# Patient Record
Sex: Female | Born: 1965 | Race: White | Hispanic: No | Marital: Married | Smoking: Never smoker
Health system: Southern US, Community
[De-identification: ages and names within clinical notes are randomized; demographics above are authoritative.]

## PROBLEM LIST (undated history)

## (undated) DIAGNOSIS — I10 Essential (primary) hypertension: Secondary | ICD-10-CM

## (undated) DIAGNOSIS — E119 Type 2 diabetes mellitus without complications: Secondary | ICD-10-CM

---

## 1998-09-13 ENCOUNTER — Encounter: Admission: RE | Admit: 1998-09-13 | Discharge: 1998-09-13 | Payer: Self-pay | Admitting: Sports Medicine

## 1998-10-10 ENCOUNTER — Encounter: Admission: RE | Admit: 1998-10-10 | Discharge: 1998-10-10 | Payer: Self-pay | Admitting: Family Medicine

## 2001-01-09 ENCOUNTER — Emergency Department (HOSPITAL_COMMUNITY): Admission: EM | Admit: 2001-01-09 | Discharge: 2001-01-09 | Payer: Self-pay | Admitting: Internal Medicine

## 2006-04-12 ENCOUNTER — Emergency Department (HOSPITAL_COMMUNITY): Admission: EM | Admit: 2006-04-12 | Discharge: 2006-04-13 | Payer: Self-pay | Admitting: Emergency Medicine

## 2006-12-29 ENCOUNTER — Emergency Department (HOSPITAL_COMMUNITY): Admission: EM | Admit: 2006-12-29 | Discharge: 2006-12-29 | Payer: Self-pay | Admitting: Emergency Medicine

## 2007-09-16 ENCOUNTER — Emergency Department (HOSPITAL_COMMUNITY): Admission: EM | Admit: 2007-09-16 | Discharge: 2007-09-16 | Payer: Self-pay | Admitting: Family Medicine
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
## 2009-07-10 ENCOUNTER — Emergency Department (HOSPITAL_COMMUNITY): Admission: EM | Admit: 2009-07-10 | Discharge: 2009-07-10 | Payer: Self-pay | Admitting: Family Medicine

## 2011-05-11 ENCOUNTER — Encounter: Payer: Self-pay | Admitting: Emergency Medicine

## 2011-05-11 ENCOUNTER — Emergency Department (HOSPITAL_COMMUNITY)
Admission: EM | Admit: 2011-05-11 | Discharge: 2011-05-11 | Disposition: A | Discharge status: Home / Self Care | Payer: Self-pay | Source: Home / Self Care | Attending: Family Medicine | Admitting: Family Medicine

## 2011-05-11 DIAGNOSIS — J329 Chronic sinusitis, unspecified: Secondary | ICD-10-CM

## 2011-05-11 MED ORDER — GUAIFENESIN-CODEINE 100-10 MG/5ML PO SYRP: 5.0000 mL | ORAL_SOLUTION | Freq: Four times a day (QID) | ORAL | Status: AC | PRN

## 2011-05-11 MED ORDER — AMOXICILLIN 500 MG PO CAPS: 500.0000 mg | ORAL_CAPSULE | Freq: Three times a day (TID) | ORAL | Status: AC

## 2011-05-11 NOTE — ED Provider Notes (Signed)
History     CSN: [id number] Arrival date & time: 05/11/2011  9:05 AM   First MD Initiated Contact with Patient 05/11/11 [phone]      Chief Complaint  Patient presents with  . Headache  . Emesis  . Sinusitis    (Consider location/radiation/quality/duration/timing/severity/associated sxs/prior treatment) Patient is a 45 y.o. female presenting with sinusitis.  Sinusitis  This is a new problem. The current episode started more than 2 days ago. The problem has been gradually worsening. There has been no fever. Associated symptoms include chills, congestion, sinus pressure, sore throat and cough. Pertinent negatives include no ear pain. Associated symptoms comments: headache.    History reviewed. No pertinent past medical history.  History reviewed. No pertinent past surgical history.  Family History  Problem Relation Age of Onset  . Hypertension Mother     History  Substance Use Topics  . Smoking status: Never Smoker   . Smokeless tobacco: Not on file  . Alcohol Use: No    OB History    Grav Para Term Preterm Abortions TAB SAB Ect Mult Living                  Review of Systems  Constitutional: Positive for chills. Negative for fever.  HENT: Positive for congestion, sore throat, rhinorrhea, postnasal drip and sinus pressure. Negative for ear pain.   Respiratory: Positive for cough. Negative for wheezing.   Cardiovascular: Negative.   Gastrointestinal: Negative.   Genitourinary: Negative.   Neurological: Positive for headaches. Negative for dizziness.    Allergies  Review of patient's allergies indicates no known allergies.  Home Medications   Current Outpatient Rx  Name Route Sig Dispense Refill  . AMOXICILLIN 500 MG PO CAPS Oral Take 1 capsule (500 mg total) by mouth 3 (three) times daily. 30 capsule 0  . GUAIFENESIN-CODEINE 100-10 MG/5ML PO SYRP Oral Take 5 mLs by mouth every 6 (six) hours as needed for cough. 120 mL 0    BP 135/75  Pulse 66  Temp(Src) 98.2  F (36.8 C) (Oral)  Resp 16  SpO2 99%  LMP 04/22/2011  Physical Exam  Nursing note and vitals reviewed. Constitutional: She appears well-developed and well-nourished.  HENT:  Head: Normocephalic.  Right Ear: External ear normal.  Left Ear: External ear normal.       Nose congested , post nasal drainage noted. Throat mild erythema.  Neck: Normal range of motion. Neck supple.  Cardiovascular: Normal rate and regular rhythm.   Pulmonary/Chest: Effort normal and breath sounds normal.       Dry cough noted  Skin: Skin is warm and dry.    ED Course  Procedures (including critical care time)  Labs Reviewed - No data to display No results found.   1. Sinusitis       MDM          Randa Spike, MD 05/11/11 1006

## 2011-05-11 NOTE — ED Notes (Signed)
Pt comes in with sinus infection that started Tuesday but has worsening h/a,pain behind eyes and now x 2 episodes of vomiting and weakness.pt also reports cough with ocass white phlegm.denies fever,chills.pt has been taking otc dayquil/nyquil but not working.

## 2017-02-01 ENCOUNTER — Encounter (HOSPITAL_COMMUNITY): Payer: Self-pay | Admitting: Nurse Practitioner

## 2017-02-01 ENCOUNTER — Emergency Department (HOSPITAL_COMMUNITY): Payer: Self-pay

## 2017-02-01 ENCOUNTER — Emergency Department (HOSPITAL_COMMUNITY)
Admission: EM | Admit: 2017-02-01 | Discharge: 2017-02-02 | Disposition: A | Discharge status: Home / Self Care | Payer: Self-pay | Attending: Emergency Medicine | Admitting: Emergency Medicine

## 2017-02-01 DIAGNOSIS — I10 Essential (primary) hypertension: Secondary | ICD-10-CM | POA: Insufficient documentation

## 2017-02-01 DIAGNOSIS — M79605 Pain in left leg: Secondary | ICD-10-CM | POA: Insufficient documentation

## 2017-02-01 DIAGNOSIS — R2242 Localized swelling, mass and lump, left lower limb: Secondary | ICD-10-CM | POA: Insufficient documentation

## 2017-02-01 DIAGNOSIS — M7989 Other specified soft tissue disorders: Secondary | ICD-10-CM

## 2017-02-01 HISTORY — DX: Essential (primary) hypertension: I10

## 2017-02-01 LAB — CBC WITH DIFFERENTIAL/PLATELET
BASOS PCT: 0 %
Basophils Absolute: 0 10*3/uL (ref 0.0–0.1)
EOS ABS: 0.1 10*3/uL (ref 0.0–0.7)
Eosinophils Relative: 2 %
HCT: 34.6 % — ABNORMAL LOW (ref 36.0–46.0)
HEMOGLOBIN: 12 g/dL (ref 12.0–15.0)
Lymphocytes Relative: 34 %
Lymphs Abs: 1 10*3/uL (ref 0.7–4.0)
MCH: 35.4 pg — ABNORMAL HIGH (ref 26.0–34.0)
MCHC: 34.7 g/dL (ref 30.0–36.0)
MCV: 102.1 fL — ABNORMAL HIGH (ref 78.0–100.0)
MONOS PCT: 14 %
Monocytes Absolute: 0.4 10*3/uL (ref 0.1–1.0)
NEUTROS PCT: 49 %
Neutro Abs: 1.5 10*3/uL — ABNORMAL LOW (ref 1.7–7.7)
PLATELETS: DECREASED 10*3/uL (ref 150–400)
RBC: 3.39 MIL/uL — ABNORMAL LOW (ref 3.87–5.11)
RDW: 14.5 % (ref 11.5–15.5)
WBC: 3 10*3/uL — ABNORMAL LOW (ref 4.0–10.5)

## 2017-02-01 LAB — BASIC METABOLIC PANEL
Anion gap: 5 (ref 5–15)
BUN: 7 mg/dL (ref 6–20)
CALCIUM: 8.7 mg/dL — AB (ref 8.9–10.3)
CO2: 25 mmol/L (ref 22–32)
CREATININE: 0.65 mg/dL (ref 0.44–1.00)
Chloride: 107 mmol/L (ref 101–111)
GFR calc non Af Amer: >60 mL/min (ref >60)
Glucose, Bld: 108 mg/dL — ABNORMAL HIGH (ref 65–99)
Potassium: 3.8 mmol/L (ref 3.5–5.1)
SODIUM: 137 mmol/L (ref 135–145)

## 2017-02-01 MED ORDER — MORPHINE SULFATE (PF) 4 MG/ML IV SOLN
4.0000 mg | Freq: Once | INTRAVENOUS | Status: AC
  Administered 2017-02-01: 4 mg via INTRAVENOUS
  Filled 2017-02-01: qty 1

## 2017-02-01 MED ORDER — ONDANSETRON HCL 4 MG/2ML IJ SOLN
4.0000 mg | Freq: Once | INTRAMUSCULAR | Status: AC
Start: 2017-02-01 — End: 2017-02-01
  Administered 2017-02-01: 4 mg via INTRAVENOUS
  Filled 2017-02-01: qty 2

## 2017-02-01 MED ORDER — VANCOMYCIN HCL IN DEXTROSE 1-5 GM/200ML-% IV SOLN: 1000.0000 mg | Freq: Three times a day (TID) | INTRAVENOUS | Status: DC

## 2017-02-01 MED ORDER — VANCOMYCIN HCL 10 G IV SOLR
2000.0000 mg | Freq: Once | INTRAVENOUS | Status: AC
  Administered 2017-02-01: 2000 mg via INTRAVENOUS
  Filled 2017-02-01: qty 2000

## 2017-02-01 NOTE — ED Notes (Signed)
PA at bedside updating patient at this time.   

## 2017-02-01 NOTE — ED Triage Notes (Signed)
Pt presents with c/o left lower extremity pain. The pain began about two days ago and since then she's developed redness and swelling from ankle to knee.

## 2017-02-01 NOTE — Progress Notes (Signed)
Pharmacy Antibiotic Note  Mary Anderson is a 51 y.o. female admitted on 02/01/2017 with cellulitis.  Pharmacy has been consulted for Vancomycin dosing. WBC 3. SCr wnl. CrCl > 100  Patient reports her weight is ~ 95 kg   Plan: -Vancomycin 2 gm IV bolus once, then 1 gm IV Q 8 hours -Monitor CBC, renal fx, cultures and clinical progress -VT at SS     Temp (24hrs), Avg:98.2 F (36.8 C), Min:98.2 F (36.8 C), Max:98.2 F (36.8 C)   Recent Labs Lab 02/01/17 2104  WBC 3.0*    CrCl cannot be calculated (No order found.).    No Known Allergies  Antimicrobials this admission: Vanc 8/3 >>    Dose adjustments this admission: None  Microbiology results:   Thank you for allowing pharmacy to be a part of this patient's care.  Vinnie LevelBenjamin Lanell Dubie, PharmD., BCPS Clinical Pharmacist Pager [phone]

## 2017-02-02 ENCOUNTER — Ambulatory Visit (HOSPITAL_COMMUNITY)
Admission: RE | Admit: 2017-02-02 | Discharge: 2017-02-02 | Disposition: A | Discharge status: Home / Self Care | Payer: Self-pay | Source: Ambulatory Visit | Attending: Emergency Medicine | Admitting: Emergency Medicine

## 2017-02-02 DIAGNOSIS — R590 Localized enlarged lymph nodes: Secondary | ICD-10-CM | POA: Insufficient documentation

## 2017-02-02 DIAGNOSIS — M79609 Pain in unspecified limb: Secondary | ICD-10-CM

## 2017-02-02 DIAGNOSIS — M7989 Other specified soft tissue disorders: Secondary | ICD-10-CM | POA: Insufficient documentation

## 2017-02-02 MED ORDER — CEPHALEXIN 500 MG PO CAPS: 500.0000 mg | ORAL_CAPSULE | Freq: Four times a day (QID) | ORAL | 0 refills | Status: DC

## 2017-02-02 MED ORDER — DOXYCYCLINE HYCLATE 100 MG PO CAPS: 100.0000 mg | ORAL_CAPSULE | Freq: Two times a day (BID) | ORAL | 0 refills | Status: DC

## 2017-02-02 NOTE — Progress Notes (Signed)
VASCULAR LAB PRELIMINARY  PRELIMINARY  PRELIMINARY  PRELIMINARY  Left lower extremity venous duplex completed.    Preliminary report:  There is no DVT or SVT noted in the left lower extremity.  There is an enlarge inguinal lymph node noted. Interstitial fluid noted throughout left calf.   Talik Casique, RVT 02/02/2017, 8:25 AM

## 2017-02-02 NOTE — ED Provider Notes (Signed)
[facility]-EMERGENCY DEPT Provider Note   CSN: [id number] Arrival date & time: 02/01/17  1827     History   Chief Complaint Chief Complaint  Patient presents with  . Leg Pain    HPI Pearlie Oysterammy C Cuffie is a 51 y.o. female.  HPI 51 year old Caucasian female past medical history significant for hypertension presents to the emergency Department today with complaints of left leg redness, swelling and mild pain. The patient states that her symptoms started 3 days ago and have gradually worsened. Unknown as she was bitten by an insect or any trauma. States that she is able to ambulate with some pain. The patient has not tried nothing for her symptoms prior to arrival. Nothing makes better or worse. Patient denies any history of DVT/PE, prolonged immobilizations, recent hospitalizations/surgeries, OCP use, tobacco use. Patient denies any fever, chills, chest pain, shortness of breath, lightheadedness, dizziness, headache, urinary symptoms. Past Medical History:  Diagnosis Date  . Hypertension     There are no active problems to display for this patient.   History reviewed. No pertinent surgical history.  OB History    No data available       Home Medications    Prior to Admission medications   Medication Sig Start Date End Date Taking? Authorizing Provider  acetaminophen (TYLENOL) 500 MG tablet Take 500 mg by mouth every 6 (six) hours as needed for mild pain.   Yes [provider]  ibuprofen (ADVIL,MOTRIN) 200 MG tablet Take 200 mg by mouth every 6 (six) hours as needed for mild pain.   Yes [provider]    Family History Family History  Problem Relation Age of Onset  . Hypertension Mother     Social History Social History  Substance Use Topics  . Smoking status: Never Smoker  . Smokeless tobacco: Never Used  . Alcohol use Yes     Allergies   Patient has no known allergies.   Review of Systems Review of Systems  Constitutional: Negative for chills and  fever.  HENT: Negative for congestion.   Eyes: Negative for visual disturbance.  Respiratory: Negative for cough and shortness of breath.   Cardiovascular: Positive for leg swelling. Negative for chest pain.  Gastrointestinal: Negative for abdominal pain, diarrhea, nausea and vomiting.  Genitourinary: Negative for dysuria, flank pain, frequency, hematuria, urgency, vaginal bleeding and vaginal discharge.  Musculoskeletal: Positive for myalgias. Negative for arthralgias.  Skin: Positive for color change and wound. Negative for rash.  Neurological: Negative for dizziness, syncope, weakness, light-headedness, numbness and headaches.  Psychiatric/Behavioral: Negative for sleep disturbance. The patient is not nervous/anxious.      Physical Exam Updated Vital Signs BP (!) 146/64   Pulse 60   Temp 98.2 F (36.8 C) (Oral)   Resp 20   LMP 04/22/2011   SpO2 100%   Physical Exam  Constitutional: She is oriented to person, place, and time. She appears well-developed and well-nourished.  Non-toxic appearance. No distress.  HENT:  Head: Normocephalic and atraumatic.  Nose: Nose normal.  Mouth/Throat: Oropharynx is clear and moist.  Eyes: Pupils are equal, round, and reactive to light. Conjunctivae are normal. Right eye exhibits no discharge. Left eye exhibits no discharge.  Neck: Normal range of motion. Neck supple.  Cardiovascular: Normal rate, regular rhythm, normal heart sounds and intact distal pulses.   Pulmonary/Chest: Effort normal and breath sounds normal. No respiratory distress. She exhibits no tenderness.  Abdominal: Soft. Bowel sounds are normal. There is no tenderness. There is no rebound and no  guarding.  Musculoskeletal: Normal range of motion. She exhibits no tenderness.  DP pulses are 2+ bilaterally. Cap refill is normal.  Lymphadenopathy:    She has no cervical adenopathy.  Neurological: She is alert and oriented to person, place, and time.  Sensation intact in all  dermatomes. Strength in lower extremity is are equal bilaterally. Patellar reflexes are normal bilaterally.  Skin: Skin is warm and dry. Capillary refill takes less than 2 seconds.  Pitting edema and erythema with some warmth noted to the left lower extremity between the knee and ankle. The patient is mildly tender to palpation over the anterior aspect of the tib-fib. No calf tenderness. No edema or erythema of the left thigh. Wound is noted over the area without any purulent drainage, fluctuance area, bleeding.  Psychiatric: Her behavior is normal. Judgment and thought content normal.  Nursing note and vitals reviewed.    ED Treatments / Results  Labs (all labs ordered are listed, but only abnormal results are displayed) Labs Reviewed  BASIC METABOLIC PANEL - Abnormal; Notable for the following:       Result Value   Glucose, Bld 108 (*)    Calcium 8.7 (*)    All other components within normal limits  CBC WITH DIFFERENTIAL/PLATELET - Abnormal; Notable for the following:    WBC 3.0 (*)    RBC 3.39 (*)    HCT 34.6 (*)    MCV 102.1 (*)    MCH 35.4 (*)    Neutro Abs 1.5 (*)    All other components within normal limits    EKG  EKG Interpretation None       Radiology Dg Tibia/fibula Left  Result Date: 02/01/2017 CLINICAL DATA:  50 y/o  F; cellulitis. EXAM: LEFT TIBIA AND FIBULA - 2 VIEW COMPARISON:  None. FINDINGS: There is no evidence of fracture or other focal bone lesions. Small dorsal and plantar calcaneal enthesophytes. Mild narrowing of the medial femorotibial compartment. IMPRESSION: No acute osseous abnormality is evident. Electronically Signed   By: Mitzi Hansen M.D.   On: 02/01/2017 22:29    Procedures Procedures (including critical care time)  Medications Ordered in ED Medications  vancomycin (VANCOCIN) 2,000 mg in sodium chloride 0.9 % 500 mL IVPB (2,000 mg Intravenous New Bag/Given 02/01/17 2228)  vancomycin (VANCOCIN) IVPB 1000 mg/200 mL premix (not  administered)  morphine 4 MG/ML injection 4 mg (4 mg Intravenous Given 02/01/17 2046)  ondansetron (ZOFRAN) injection 4 mg (4 mg Intravenous Given 02/01/17 2046)     Initial Impression / Assessment and Plan / ED Course  I have reviewed the triage vital signs and the nursing notes.  Pertinent labs & imaging results that were available during my care of the patient were reviewed by me and considered in my medical decision making (see chart for details).     Patient presents to the ED with complaints of left lower extremity erythema, warmth, swelling onset 3 days ago. Patient denies any associated symptoms of fever, chills, nausea, vomiting, chest pain, shortness of breath. Patient without a history of DVT/PE. The patient has no calf tenderness. She is mildly tender over the anterior aspect the lower leg. The patient's symptoms and presentation seemed consistent with cellulitis. Patient is afebrile. Vital signs are reassuring. No tachycardia or hypotension. No leukocytosis is noted. Mild leukopenia of 3000 is noted. Unknown baseline. All other labs are unremarkable. Pt does not meet SIRS or SEPSIS criteria. X-ray obtained shows no subcutaneous gas or osseous involvement. We'll start patient on IV  vancomycin for one dose in the ED. We'll discharge home on doxycycline and Keflex for cellulitis. Will order outpatient ultrasound to assess for blood clot however have low suspicion that is was causing her symptoms. I spoke with Dr. Estell HarpinZammit who also evaluated patient does not feel that she needs subcutaneous Lovenox this evening. Patient will need close follow-up with PCP or return to the ED in 2-3 days for wound recheck or sooner if symptoms worsen.   Pt is hemodynamically stable, in NAD, & able to ambulate in the ED. Evaluation does not show pathology that would require ongoing emergent intervention or inpatient treatment. I explained the diagnosis to the patient. Pain has been managed & has no complaints prior to  dc. Pt is comfortable with above plan and is stable for discharge at this time. All questions were answered prior to disposition. Strict return precautions for f/u to the ED were discussed. Encouraged follow up with PCP.   Final Clinical Impressions(s) / ED Diagnoses   Final diagnoses:  Left leg swelling  Left leg pain    New Prescriptions New Prescriptions   CEPHALEXIN (KEFLEX) 500 MG CAPSULE    Take 1 capsule (500 mg total) by mouth 4 (four) times daily.   DOXYCYCLINE (VIBRAMYCIN) 100 MG CAPSULE    Take 1 capsule (100 mg total) by mouth 2 (two) times daily.     Rise MuLeaphart, Lantz Hermann T, PA-C 02/02/17 Orpah [id number]    Bethann BerkshireZammit, Joseph, MD 02/02/17 1440

## 2017-02-02 NOTE — ED Notes (Signed)
Patient able to ambulate independently  

## 2017-02-02 NOTE — Discharge Instructions (Signed)
Please take the antibiotics as prescribed. Follow-up tomorrow for the ultrasound study. You need a recheck in 2-3 days or sooner if your symptoms are worsening. This is likely infection of the skin.    IMPORTANT PATIENT INSTRUCTIONS:  [facility].    If tomorrow is a Saturday, Sunday or holiday, please go to [facility] Registration Desk at 8 am tomorrow morning and tell them you are there for a vascular study.  If tomorrow is a weekday (Monday-Friday), [facility] at 8 am and tell them  you are  there for a vascular study.

## 2017-02-07 ENCOUNTER — Emergency Department (HOSPITAL_COMMUNITY)
Admission: EM | Admit: 2017-02-07 | Discharge: 2017-02-07 | Disposition: A | Discharge status: Home / Self Care | Payer: Self-pay | Attending: Emergency Medicine | Admitting: Emergency Medicine

## 2017-02-07 ENCOUNTER — Encounter (HOSPITAL_COMMUNITY): Payer: Self-pay | Admitting: Emergency Medicine

## 2017-02-07 DIAGNOSIS — I878 Other specified disorders of veins: Secondary | ICD-10-CM | POA: Insufficient documentation

## 2017-02-07 DIAGNOSIS — I10 Essential (primary) hypertension: Secondary | ICD-10-CM | POA: Insufficient documentation

## 2017-02-07 DIAGNOSIS — L03116 Cellulitis of left lower limb: Secondary | ICD-10-CM | POA: Insufficient documentation

## 2017-02-07 LAB — COMPREHENSIVE METABOLIC PANEL
ALBUMIN: 2.8 g/dL — AB (ref 3.5–5.0)
ALT: 96 U/L — ABNORMAL HIGH (ref 14–54)
ANION GAP: 5 (ref 5–15)
AST: 131 U/L — ABNORMAL HIGH (ref 15–41)
Alkaline Phosphatase: 98 U/L (ref 38–126)
BUN: 11 mg/dL (ref 6–20)
CALCIUM: 8.6 mg/dL — AB (ref 8.9–10.3)
CHLORIDE: 106 mmol/L (ref 101–111)
CO2: 26 mmol/L (ref 22–32)
Creatinine, Ser: 0.68 mg/dL (ref 0.44–1.00)
GFR calc non Af Amer: >60 mL/min (ref >60)
GLUCOSE: 126 mg/dL — AB (ref 65–99)
POTASSIUM: 4 mmol/L (ref 3.5–5.1)
SODIUM: 137 mmol/L (ref 135–145)
Total Bilirubin: 2 mg/dL — ABNORMAL HIGH (ref 0.3–1.2)
Total Protein: 6.6 g/dL (ref 6.5–8.1)

## 2017-02-07 LAB — CBC WITH DIFFERENTIAL/PLATELET
BASOS PCT: 0 %
Basophils Absolute: 0 10*3/uL (ref 0.0–0.1)
Eosinophils Absolute: 0.1 10*3/uL (ref 0.0–0.7)
Eosinophils Relative: 3 %
HEMATOCRIT: 35 % — AB (ref 36.0–46.0)
HEMOGLOBIN: 12.1 g/dL (ref 12.0–15.0)
LYMPHS ABS: 0.9 10*3/uL (ref 0.7–4.0)
LYMPHS PCT: 29 %
MCH: 35 pg — ABNORMAL HIGH (ref 26.0–34.0)
MCHC: 34.6 g/dL (ref 30.0–36.0)
MCV: 101.2 fL — AB (ref 78.0–100.0)
MONOS PCT: 11 %
Monocytes Absolute: 0.3 10*3/uL (ref 0.1–1.0)
NEUTROS ABS: 1.7 10*3/uL (ref 1.7–7.7)
NEUTROS PCT: 56 %
Platelets: 80 10*3/uL — ABNORMAL LOW (ref 150–400)
RBC: 3.46 MIL/uL — ABNORMAL LOW (ref 3.87–5.11)
RDW: 14.3 % (ref 11.5–15.5)
WBC: 3 10*3/uL — ABNORMAL LOW (ref 4.0–10.5)

## 2017-02-07 LAB — I-STAT CG4 LACTIC ACID, ED: LACTIC ACID, VENOUS: 1.36 mmol/L (ref 0.5–1.9)

## 2017-02-07 MED ORDER — FUROSEMIDE 10 MG/ML IJ SOLN
40.0000 mg | Freq: Once | INTRAMUSCULAR | Status: AC
  Administered 2017-02-07: 40 mg via INTRAVENOUS
  Filled 2017-02-07: qty 4

## 2017-02-07 MED ORDER — FUROSEMIDE 20 MG PO TABS: 20.0000 mg | ORAL_TABLET | Freq: Every day | ORAL | 0 refills | Status: DC

## 2017-02-07 MED ORDER — MORPHINE SULFATE (PF) 4 MG/ML IV SOLN
4.0000 mg | Freq: Once | INTRAVENOUS | Status: AC
  Administered 2017-02-07: 4 mg via INTRAVENOUS
  Filled 2017-02-07: qty 1

## 2017-02-07 MED ORDER — OXYCODONE-ACETAMINOPHEN 5-325 MG PO TABS: 1.0000 | ORAL_TABLET | Freq: Three times a day (TID) | ORAL | 0 refills | Status: DC | PRN

## 2017-02-07 MED ORDER — CLINDAMYCIN HCL 150 MG PO CAPS: 300.0000 mg | ORAL_CAPSULE | Freq: Four times a day (QID) | ORAL | 0 refills | Status: DC

## 2017-02-07 NOTE — ED Provider Notes (Signed)
[facility]-EMERGENCY DEPT Provider Note   CSN: [id number] Arrival date & time: 02/07/17  1518   History   Chief Complaint Chief Complaint  Patient presents with  . Leg Pain    HPI Mary Anderson is a 51 y.o. female who presented with progressively worsening left distal extremity pain, swelling and erythema. She denies a past medical history as she does not see a PCP and has not for some time. She stated that due to the pain she visited the ED on Friday and was given two antibiotics. She had completed all but one dose of the antibiotics but stopped short of completing the doxycycline as she developed a rash on sun exposed areas. She denied reduction of pain with ibuprofin, tylenol, or naproxen but states that elevation of the leg is beneficial. The pain is 10/10 today, burning and sharp in nature without relief. Her leg swelling is not new, she has had this for an unknown time but it has never been painful, red or warm to touch before. With regard to the rash, it is burning, very painful, and of significant concern to the patient.   HPI  Past Medical History:  Diagnosis Date  . Hypertension     There are no active problems to display for this patient.   History reviewed. No pertinent surgical history.  OB History    No data available       Home Medications    Prior to Admission medications   Medication Sig Start Date End Date Taking? Authorizing Provider  acetaminophen (TYLENOL) 500 MG tablet Take 500 mg by mouth every 6 (six) hours as needed for mild pain.   Yes [provider]  cephALEXin (KEFLEX) 500 MG capsule Take 1 capsule (500 mg total) by mouth 4 (four) times daily. 02/02/17  Yes Rise MuLeaphart, Kenneth T, PA-C  doxycycline (VIBRAMYCIN) 100 MG capsule Take 1 capsule (100 mg total) by mouth 2 (two) times daily. 02/02/17  Yes Leaphart, Lynann BeaverKenneth T, PA-C  ibuprofen (ADVIL,MOTRIN) 200 MG tablet Take 200 mg by mouth every 6 (six) hours as needed for mild pain.   Yes [provider]  clindamycin (CLEOCIN) 150 MG capsule Take 2 capsules (300 mg total) by mouth every 6 (six) hours. 02/07/17   Lanelle BalHarbrecht, Tabor Bartram, MD  furosemide (LASIX) 20 MG tablet Take 1 tablet (20 mg total) by mouth daily. 02/07/17   Lanelle BalHarbrecht, Avari Nevares, MD  oxyCODONE-acetaminophen (PERCOCET/ROXICET) 5-325 MG tablet Take 1 tablet by mouth every 8 (eight) hours as needed for severe pain. 02/07/17   Lanelle BalHarbrecht, Genise Strack, MD    Family History Family History  Problem Relation Age of Onset  . Hypertension Mother     Social History Social History  Substance Use Topics  . Smoking status: Never Smoker  . Smokeless tobacco: Never Used  . Alcohol use Yes     Allergies   Patient has no known allergies.   Review of Systems Review of Systems  Constitutional: Negative for appetite change, chills, diaphoresis, fatigue and fever.  Eyes: Negative for photophobia and visual disturbance.  Respiratory: Negative for cough, chest tightness, shortness of breath and wheezing.   Cardiovascular: Positive for leg swelling. Negative for chest pain and palpitations.  Gastrointestinal: Positive for diarrhea. Negative for abdominal pain, constipation, nausea and vomiting.  Genitourinary: Negative for difficulty urinating, frequency and urgency.  Musculoskeletal: Negative for back pain and joint swelling.  Skin: Positive for color change and rash.  Neurological: Negative for dizziness, syncope, weakness, numbness and headaches.  Psychiatric/Behavioral: Negative for  behavioral problems and confusion. The patient is not nervous/anxious.   All other systems reviewed and are negative.    Physical Exam Updated Vital Signs BP (!) 153/77   Pulse 73   Temp 98 F (36.7 C) (Oral)   Resp 16   LMP 04/22/2011   SpO2 100%   Physical Exam  Constitutional: She is oriented to person, place, and time. She appears well-developed and well-nourished. No distress.  HENT:  Head: Normocephalic and atraumatic.  Eyes:  Conjunctivae and EOM are normal. No scleral icterus.  Neck: Normal range of motion. Neck supple. No tracheal deviation present.  Cardiovascular: Normal rate, regular rhythm and normal heart sounds.   No murmur heard. Pulmonary/Chest: Effort normal and breath sounds normal. No respiratory distress. She has no wheezes.  Abdominal: Soft. Bowel sounds are normal. She exhibits no distension. There is no tenderness. There is no guarding.  Musculoskeletal: She exhibits edema (bilateral lower extremities, +3 pitting in left, +2 in right. both from knee distally ) and tenderness (Tenderness to left lower extremity from the knee down).  Neurological: She is alert and oriented to person, place, and time.  Skin: Skin is warm and dry. Capillary refill takes less than 2 seconds. Rash (Bilateral forearms, erythematous, blistering and on nasolabial folds) noted. She is not diaphoretic. There is erythema (Warm to touch from knee distally on left lower distal extremity). No pallor.  Psychiatric: She has a normal mood and affect. Her behavior is normal. Thought content normal.  Nursing note and vitals reviewed.    ED Treatments / Results  Labs (all labs ordered are listed, but only abnormal results are displayed) Labs Reviewed  COMPREHENSIVE METABOLIC PANEL - Abnormal; Notable for the following:       Result Value   Glucose, Bld 126 (*)    Calcium 8.6 (*)    Albumin 2.8 (*)    AST 131 (*)    ALT 96 (*)    Total Bilirubin 2.0 (*)    All other components within normal limits  CBC WITH DIFFERENTIAL/PLATELET - Abnormal; Notable for the following:    WBC 3.0 (*)    RBC 3.46 (*)    HCT 35.0 (*)    MCV 101.2 (*)    MCH 35.0 (*)    Platelets 80 (*)    All other components within normal limits  I-STAT CG4 LACTIC ACID, ED    EKG  EKG Interpretation None       Radiology No results found.  Procedures Procedures (including critical care time)  Medications Ordered in ED Medications  furosemide  (LASIX) injection 40 mg (40 mg Intravenous Given 02/07/17 2015)  morphine 4 MG/ML injection 4 mg (4 mg Intravenous Given 02/07/17 2015)    Initial Impression / Assessment and Plan / ED Course  I have reviewed the triage vital signs and the nursing notes.  Pertinent labs & imaging results that were available during my care of the patient were reviewed by me and considered in my medical decision making (see chart for details).    Assessment: Hoang Reich is a 51 year old female who presented with progressively worsening leg pain. Given her history, physical exam, CBC, and DVT eval on previous visit, she is most likely suffering from venous stasis with possible secondary unresolved cellulitis due to the significant edema. Differential includes arterial   Plan: Furosemide 40mg  for edema Morphine 4mg  for pain control CBC unremarkable, with WBC 3.0 CMP unremarkable    Final Clinical Impressions(s) / ED Diagnoses   Final  diagnoses:  Venous stasis  Cellulitis of left lower extremity    New Prescriptions New Prescriptions   CLINDAMYCIN (CLEOCIN) 150 MG CAPSULE    Take 2 capsules (300 mg total) by mouth every 6 (six) hours.   FUROSEMIDE (LASIX) 20 MG TABLET    Take 1 tablet (20 mg total) by mouth daily.   OXYCODONE-ACETAMINOPHEN (PERCOCET/ROXICET) 5-325 MG TABLET    Take 1 tablet by mouth every 8 (eight) hours as needed for severe pain.     Lanelle Bal, MD 02/07/17 2133    Charlynne Pander, MD 02/08/17 [phone]

## 2017-02-07 NOTE — Discharge Instructions (Signed)
Please complete your furosemide and clindamycin.   Please use the oxycodone as needed for severe pain only. If pain remits, discontinue use.  Please apply pressure to the bilateral lower extremities with compression wraps or stockings.  It is important that you also follow-up with a primary care doctor within a week or two for follow-up of your current care. Given you high blood pressure today, the chronic peripheral edema(swelling) it is important to follow these for long term treatment.   Thank you for visiting us for your care.

## 2017-02-07 NOTE — ED Notes (Signed)
Pt getting dressed and IV removed.

## 2017-02-07 NOTE — ED Triage Notes (Signed)
Pt to ER for two concerns; first concern being that she has been on doxycycline and cephalexin x1 week for left lower leg cellulitis and believes she is having a reaction to them, rash present to left forearm that itches, states has been in the sun a lot today. Pt leg on exam noted to be very red and warm, swollen, and tender. Denies fever. States her leg has not gotten any better with the antibiotics.

## 2017-02-07 NOTE — ED Notes (Signed)
Patient able to ambulate independently  

## 2017-07-15 ENCOUNTER — Emergency Department (HOSPITAL_BASED_OUTPATIENT_CLINIC_OR_DEPARTMENT_OTHER): Admit: 2017-07-15 | Discharge: 2017-07-15 | Disposition: A | Discharge status: Home / Self Care | Payer: Self-pay

## 2017-07-15 ENCOUNTER — Emergency Department (HOSPITAL_COMMUNITY)
Admission: EM | Admit: 2017-07-15 | Discharge: 2017-07-15 | Disposition: A | Discharge status: Home / Self Care | Payer: Self-pay | Attending: Emergency Medicine | Admitting: Emergency Medicine

## 2017-07-15 ENCOUNTER — Encounter (HOSPITAL_COMMUNITY): Payer: Self-pay

## 2017-07-15 DIAGNOSIS — Z79899 Other long term (current) drug therapy: Secondary | ICD-10-CM | POA: Insufficient documentation

## 2017-07-15 DIAGNOSIS — L03115 Cellulitis of right lower limb: Secondary | ICD-10-CM | POA: Insufficient documentation

## 2017-07-15 DIAGNOSIS — M7989 Other specified soft tissue disorders: Secondary | ICD-10-CM

## 2017-07-15 DIAGNOSIS — I10 Essential (primary) hypertension: Secondary | ICD-10-CM | POA: Insufficient documentation

## 2017-07-15 MED ORDER — CLINDAMYCIN HCL 150 MG PO CAPS: 300.0000 mg | ORAL_CAPSULE | Freq: Four times a day (QID) | ORAL | 0 refills | Status: DC

## 2017-07-15 MED ORDER — CLINDAMYCIN HCL 150 MG PO CAPS
300.0000 mg | ORAL_CAPSULE | Freq: Once | ORAL | Status: AC
  Administered 2017-07-15: 300 mg via ORAL
  Filled 2017-07-15: qty 2

## 2017-07-15 MED ORDER — FUROSEMIDE 20 MG PO TABS
20.0000 mg | ORAL_TABLET | Freq: Once | ORAL | Status: AC
  Administered 2017-07-15: 20 mg via ORAL
  Filled 2017-07-15: qty 1

## 2017-07-15 MED ORDER — FUROSEMIDE 20 MG PO TABS: 20.0000 mg | ORAL_TABLET | Freq: Every day | ORAL | 0 refills | Status: DC

## 2017-07-15 NOTE — Discharge Instructions (Signed)
Please take all of your antibiotics until finished!   You may develop abdominal discomfort or diarrhea from the antibiotic.  You may help offset this with probiotics which you can buy or get in yogurt. Do not eat  or take the probiotics until 2 hours after your antibiotic.  Take Lasix once daily for the next 3 days.  You received the first dose in the emergency department today.  Alternate ibuprofen and Tylenol as needed for pain.  Continue to elevate the lower extremity.  Continue to use compression stockings.Alternatively ibuprofen and Tylenol as needed for pain.  Follow-up with a primary care physician for reevaluation of your symptoms.  Call [facility] to establish a primary care visit follow-up as soon as possible.  Return to the ED immediately for any concerning signs or symptoms develop such as worsening redness, streaking, or worsening swelling or fevers.

## 2017-07-15 NOTE — ED Triage Notes (Signed)
Pt reports that her right leg is swelling after working for a couple days. Pt wears ted hose and reports that it has gotten worse over the last few days.

## 2017-07-15 NOTE — ED Provider Notes (Signed)
[facility] EMERGENCY DEPARTMENT Provider Note   CSN: [id number] Arrival date & time: 07/15/17  1620     History   Chief Complaint Chief Complaint  Patient presents with  . Leg Swelling    HPI Mary Anderson is a 52 y.o. female with history of hypertension presents today with chief complaint acute onset, progressively worsening right lower extremity swelling and redness for 3 days.  She states that she has no pain at rest but she does have an aching pins and needles pain on ambulation and with the leg hanging to gravity.  She notes her symptoms improved somewhat with elevation.  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  She denies fevers, chills, numbness, tingling, weakness, chest pain, shortness of breath, or abdominal pain.  No recent travel or surgeries.  No hemoptysis, no prior history of DVT or PE.  She states that she has had similar symptoms in the past and was treated for a cellulitis with clindamycin and Lasix with significant improvement in her symptoms.   she states she thinks that her symptoms began after cutting herself while shaving and she has a small scab to the lateral aspect of her right lower extremity.  She denies drainage. The history is provided by the patient.    Past Medical History:  Diagnosis Date  . Hypertension     There are no active problems to display for this patient.   History reviewed. No pertinent surgical history.  OB History    No data available       Home Medications    Prior to Admission medications   Medication Sig Start Date End Date Taking? Authorizing Provider  acetaminophen (TYLENOL) 500 MG tablet Take 500 mg by mouth every 6 (six) hours as needed for mild pain.    [provider]  cephALEXin (KEFLEX) 500 MG capsule Take 1 capsule (500 mg total) by mouth 4 (four) times daily. 02/02/17   Rise Mu, PA-C  clindamycin  (CLEOCIN) 150 MG capsule Take 2 capsules (300 mg total) by mouth 4 (four) times daily for 10 days. 07/15/17 07/25/17  Michela Pitcher A, PA-C  doxycycline (VIBRAMYCIN) 100 MG capsule Take 1 capsule (100 mg total) by mouth 2 (two) times daily. 02/02/17   Rise Mu, PA-C  furosemide (LASIX) 20 MG tablet Take 1 tablet (20 mg total) by mouth daily for 3 days. 07/15/17 07/18/17  Michela Pitcher A, PA-C  ibuprofen (ADVIL,MOTRIN) 200 MG tablet Take 200 mg by mouth every 6 (six) hours as needed for mild pain.    [provider]  oxyCODONE-acetaminophen (PERCOCET/ROXICET) 5-325 MG tablet Take 1 tablet by mouth every 8 (eight) hours as needed for severe pain. 02/07/17   Lanelle Bal, MD    Family History Family History  Problem Relation Age of Onset  . Hypertension Mother     Social History Social History   Tobacco Use  . Smoking status: Never Smoker  . Smokeless tobacco: Never Used  Substance Use Topics  . Alcohol use: Yes  . Drug use: No     Allergies   Patient has no known allergies.   Review of Systems Review of Systems  Constitutional: Negative for chills and fever.  Respiratory: Negative for shortness of breath.   Cardiovascular: Positive for leg swelling. Negative for chest pain and palpitations.  Musculoskeletal: Negative for joint swelling.  Skin: Positive for color change.  All other systems reviewed  and are negative.    Physical Exam Updated Vital Signs BP (!) 153/71 (BP Location: Right Arm)   Pulse 62   Temp 98 F (36.7 C) (Oral)   Resp 17   Ht 5\' 6"  (1.676 m)   Wt 95.3 kg (210 lb)   LMP 04/22/2011   SpO2 100%   BMI 33.89 kg/m   Physical Exam  Constitutional: She is oriented to person, place, and time. She appears well-developed and well-nourished. No distress.  HENT:  Head: Normocephalic and atraumatic.  Eyes: Conjunctivae and EOM are normal. Pupils are equal, round, and reactive to light. Right eye exhibits no discharge. Left eye exhibits no  discharge.  Neck: Normal range of motion. Neck supple. No JVD present. No tracheal deviation present.  Cardiovascular: Normal rate, regular rhythm, normal heart sounds and intact distal pulses.  2+ radial and DP/PT pulses bl, negative Homan's bl, 3+ pitting edema of the right lower extremity with erythema and tenderness noted.  No crepitus or deformity noted.  There is a small scab to the lateral aspect of the right lower extremity with no drainage or induration.  See attached images.  Pulmonary/Chest: Effort normal and breath sounds normal. She exhibits no tenderness.  Abdominal: Soft. Bowel sounds are normal. She exhibits no distension.  Musculoskeletal: Normal range of motion. She exhibits edema.  5/5 strength of BUE and BLE major muscle groups  Neurological: She is alert and oriented to person, place, and time. No sensory deficit. She exhibits normal muscle tone.  Fluent speech, no facial droop, sensation intact to soft touch of extremities  Skin: Skin is warm and dry. No erythema.  Psychiatric: She has a normal mood and affect. Her behavior is normal.  Nursing note and vitals reviewed.        ED Treatments / Results  Labs (all labs ordered are listed, but only abnormal results are displayed) Labs Reviewed - No data to display  EKG  EKG Interpretation None       Radiology No results found.  Procedures Procedures (including critical care time)  Medications Ordered in ED Medications  clindamycin (CLEOCIN) capsule 300 mg (300 mg Oral Given 07/15/17 2103)  furosemide (LASIX) tablet 20 mg (20 mg Oral Given 07/15/17 2103)     Initial Impression / Assessment and Plan / ED Course  I have reviewed the triage vital signs and the nursing notes.  Pertinent labs & imaging results that were available during my care of the patient were reviewed by me and considered in my medical decision making (see chart for details).     Patient presenting with right lower extremity edema and  erythema.  Afebrile, vital signs are stable.  DVT study negative.  Does not appear to be new onset CHF given unilateral symptoms and no shortness of breath.  No evidence of septic joint.  History and physical examination consistent with cellulitis.  She has a scab to the right lower extremity which could have been the source for infection. She has been treated for similar symptoms in the past with clindamycin and a short course of Lasix with significant improvement and eventual resolution of her symptoms. Pt is without risk factors for HIV; no recent use of steroids or other immunosuppressive medications; no Hx of diabetes.  No apparent abscess for which I&D would be possible.  Area marked and pt encouraged to return if redness begins to streak, extends beyond the markings, and/or fever or nausea/vomiting develop.  Pt is alert, oriented, NAD, afebrile, non tachycardic, nonseptic  and nontoxic appearing.  Will discharge with clindamycin and short course of Lasix, first dose is given in the emergency department today.  She will follow-up with a primary care physician for reevaluation of her symptoms on an outpatient basis.  Discussed indications for return to the ED. Pt verbalized understanding of and agreement with plan and is safe for discharge home at this time.  No complaints prior to discharge.   Final Clinical Impressions(s) / ED Diagnoses   Final diagnoses:  Cellulitis of right lower extremity    ED Discharge Orders        Ordered    clindamycin (CLEOCIN) 150 MG capsule  4 times daily     07/15/17 2118    furosemide (LASIX) 20 MG tablet  Daily     07/15/17 2118       Jeanie SewerFawze, Jaze Rodino A, PA-C 07/15/17 2126    Jeanie SewerFawze, Rhyli Depaula A, PA-C 07/15/17 2126    Arby BarrettePfeiffer, Marcy, MD 07/22/17 0020

## 2017-07-15 NOTE — Progress Notes (Signed)
Right lower extremity venous duplex has been completed. Negative for DVT. Results were given to Eyvonne MechanicJeffrey Hedges PA.  07/15/17 5:28 PM Olen CordialGreg Marianita Botkin RVT

## 2017-07-15 NOTE — ED Notes (Signed)
Delayed in blood work due [place] in [place]

## 2017-07-15 NOTE — ED Provider Notes (Signed)
Patient placed in Quick Look pathway, seen and evaluated   Chief Complaint: Leg swelling  HPI:   52 year old female presents today with complaints of right leg swelling.  Patient notes that last week she developed an upper respiratory infection with associated nausea and vomiting.  She notes those symptoms have improved, but also notes around the same time she developed swelling to the right lower extremity.  She notes swelling in the dorsum of the foot extending up to the knee with some redness along the distal third of the tibia.  She reports a very small sore on the right lateral aspect, denies any discharge.  She notes that her upper respiratory infection has resolved, no longer having any nausea vomiting, denies any fevers.  She does report a history of the same it was diagnosed as cellulitis and treated with antibiotics.  Patient denies history of DVT or PE.  No significant risk factors.  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  Patient reports full active range of motion of the foot and ankle, no loss of sensation strength or motor function  ROS: Positive for distal edema, negative for fever, negative (one)  Physical Exam:   Gen: No distress  Neuro: Awake and Alert  Skin: Warm    Focused Exam: Swelling noted to the right lower extremity with cellulitis along the distal tibia, no purulence, no fluctuance full active range of motion of the ankle with sensation and pedal pulses intact, no signs of compartment syndrome   Patient presents with lower extremity swelling edema.  She will have a DVT study to rule out DVT, she will see provider on acute side.   Initiation of care has begun. The patient has been counseled on the process, plan, and necessity for staying for the completion/evaluation, and the remainder of the medical screening examination    Eyvonne MechanicHedges, Eddie Koc, Cordelia Poche-C 07/15/17 1637    Jacalyn LefevreHaviland, Julie, MD 07/15/17 1700

## 2017-07-21 ENCOUNTER — Encounter (HOSPITAL_COMMUNITY): Payer: Self-pay

## 2017-07-21 ENCOUNTER — Other Ambulatory Visit: Payer: Self-pay

## 2017-07-21 ENCOUNTER — Observation Stay (HOSPITAL_COMMUNITY)
Admission: EM | Admit: 2017-07-21 | Discharge: 2017-07-22 | Disposition: A | Discharge status: Home / Self Care | Payer: Self-pay | Attending: Internal Medicine | Admitting: Internal Medicine

## 2017-07-21 DIAGNOSIS — L0390 Cellulitis, unspecified: Secondary | ICD-10-CM | POA: Diagnosis present

## 2017-07-21 DIAGNOSIS — F1099 Alcohol use, unspecified with unspecified alcohol-induced disorder: Secondary | ICD-10-CM

## 2017-07-21 DIAGNOSIS — R740 Nonspecific elevation of levels of transaminase and lactic acid dehydrogenase [LDH]: Secondary | ICD-10-CM | POA: Insufficient documentation

## 2017-07-21 DIAGNOSIS — L03115 Cellulitis of right lower limb: Principal | ICD-10-CM | POA: Insufficient documentation

## 2017-07-21 DIAGNOSIS — I10 Essential (primary) hypertension: Secondary | ICD-10-CM | POA: Insufficient documentation

## 2017-07-21 DIAGNOSIS — Z8249 Family history of ischemic heart disease and other diseases of the circulatory system: Secondary | ICD-10-CM

## 2017-07-21 LAB — COMPREHENSIVE METABOLIC PANEL
ALT: 104 U/L — ABNORMAL HIGH (ref 14–54)
AST: 134 U/L — ABNORMAL HIGH (ref 15–41)
Albumin: 2.7 g/dL — ABNORMAL LOW (ref 3.5–5.0)
Alkaline Phosphatase: 92 U/L (ref 38–126)
Anion gap: 10 (ref 5–15)
BUN: 7 mg/dL (ref 6–20)
CO2: 22 mmol/L (ref 22–32)
Calcium: 8.3 mg/dL — ABNORMAL LOW (ref 8.9–10.3)
Chloride: 104 mmol/L (ref 101–111)
Creatinine, Ser: 0.72 mg/dL (ref 0.44–1.00)
GFR calc Af Amer: >60 mL/min (ref >60)
GFR calc non Af Amer: >60 mL/min (ref >60)
Glucose, Bld: 124 mg/dL — ABNORMAL HIGH (ref 65–99)
Potassium: 3.9 mmol/L (ref 3.5–5.1)
Sodium: 136 mmol/L (ref 135–145)
Total Bilirubin: 2.3 mg/dL — ABNORMAL HIGH (ref 0.3–1.2)
Total Protein: 6.7 g/dL (ref 6.5–8.1)

## 2017-07-21 LAB — CBC
HCT: 39.5 % (ref 36.0–46.0)
Hemoglobin: 13.4 g/dL (ref 12.0–15.0)
MCH: 35.2 pg — ABNORMAL HIGH (ref 26.0–34.0)
MCHC: 33.9 g/dL (ref 30.0–36.0)
MCV: 103.7 fL — AB (ref 78.0–100.0)
PLATELETS: 98 10*3/uL — AB (ref 150–400)
RBC: 3.81 MIL/uL — AB (ref 3.87–5.11)
RDW: 14.9 % (ref 11.5–15.5)
WBC: 5.1 10*3/uL (ref 4.0–10.5)

## 2017-07-21 LAB — CBC WITH DIFFERENTIAL/PLATELET
Basophils Absolute: 0 10*3/uL (ref 0.0–0.1)
Basophils Relative: 0 %
Eosinophils Absolute: 0.1 10*3/uL (ref 0.0–0.7)
Eosinophils Relative: 1 %
HCT: 41.2 % (ref 36.0–46.0)
Hemoglobin: 14.1 g/dL (ref 12.0–15.0)
Lymphocytes Relative: 22 %
Lymphs Abs: 1.1 10*3/uL (ref 0.7–4.0)
MCH: 35.1 pg — ABNORMAL HIGH (ref 26.0–34.0)
MCHC: 34.2 g/dL (ref 30.0–36.0)
MCV: 102.5 fL — ABNORMAL HIGH (ref 78.0–100.0)
Monocytes Absolute: 0.7 10*3/uL (ref 0.1–1.0)
Monocytes Relative: 13 %
Neutro Abs: 3.2 10*3/uL (ref 1.7–7.7)
Neutrophils Relative %: 64 %
Platelets: 124 10*3/uL — ABNORMAL LOW (ref 150–400)
RBC: 4.02 MIL/uL (ref 3.87–5.11)
RDW: 14.6 % (ref 11.5–15.5)
WBC: 5 10*3/uL (ref 4.0–10.5)

## 2017-07-21 LAB — CREATININE, SERUM
Creatinine, Ser: 0.67 mg/dL (ref 0.44–1.00)
GFR calc Af Amer: >60 mL/min (ref >60)
GFR calc non Af Amer: >60 mL/min (ref >60)

## 2017-07-21 LAB — I-STAT CG4 LACTIC ACID, ED: Lactic Acid, Venous: 1.75 mmol/L (ref 0.5–1.9)

## 2017-07-21 MED ORDER — ACETAMINOPHEN 325 MG PO TABS
650.0000 mg | ORAL_TABLET | Freq: Four times a day (QID) | ORAL | Status: DC | PRN
  Administered 2017-07-22: 650 mg via ORAL
  Filled 2017-07-21: qty 2

## 2017-07-21 MED ORDER — CEPHALEXIN 500 MG PO CAPS
500.0000 mg | ORAL_CAPSULE | Freq: Four times a day (QID) | ORAL | Status: DC
  Administered 2017-07-21 – 2017-07-22 (×4): 500 mg via ORAL
  Filled 2017-07-21: qty 2
  Filled 2017-07-21 (×3): qty 1

## 2017-07-21 MED ORDER — LISINOPRIL 10 MG PO TABS
10.0000 mg | ORAL_TABLET | Freq: Every day | ORAL | Status: DC
  Administered 2017-07-21 – 2017-07-22 (×2): 10 mg via ORAL
  Filled 2017-07-21 (×2): qty 1

## 2017-07-21 MED ORDER — HYDROCODONE-ACETAMINOPHEN 5-325 MG PO TABS
1.0000 | ORAL_TABLET | Freq: Once | ORAL | Status: AC
  Administered 2017-07-21: 1 via ORAL
  Filled 2017-07-21: qty 1

## 2017-07-21 MED ORDER — ACETAMINOPHEN 650 MG RE SUPP: 650.0000 mg | Freq: Four times a day (QID) | RECTAL | Status: DC | PRN

## 2017-07-21 MED ORDER — VANCOMYCIN HCL IN DEXTROSE 1-5 GM/200ML-% IV SOLN
1000.0000 mg | Freq: Once | INTRAVENOUS | Status: AC
  Administered 2017-07-21: 1000 mg via INTRAVENOUS
  Filled 2017-07-21: qty 200

## 2017-07-21 MED ORDER — ENOXAPARIN SODIUM 40 MG/0.4ML ~~LOC~~ SOLN
40.0000 mg | SUBCUTANEOUS | Status: DC
  Administered 2017-07-21: 40 mg via SUBCUTANEOUS
  Filled 2017-07-21: qty 0.4

## 2017-07-21 NOTE — ED Provider Notes (Signed)
[facility] EMERGENCY DEPARTMENT Provider Note   CSN: [id number] Arrival date & time: 07/21/17  1103     History   Chief Complaint Chief Complaint  Patient presents with  . Leg Swelling    HPI Mary Anderson is a 52 y.o. female with past medical history of hypertension, presenting to the ED for subsequent visit for cellulitis of the right lower leg.  Patient was seen on 07/15/2017 in this ED for initial evaluation.  DVT study was done at that time and was negative.  She was discharged with clindamycin and 3 days of Lasix.  She states she has been taking the clindamycin as prescribed, and was nonweightbearing for 3 days as instructed.  She reports today for worsening redness and pain. Pain is worse with palpation and movement.  Denies fever/chills, history of DVT, numbness, or other complaints.  Denies history of diabetes or immunocompromise.  The history is provided by the patient.    Past Medical History:  Diagnosis Date  . Hypertension     Patient Active Problem List   Diagnosis Date Noted  . Cellulitis 07/21/2017    History reviewed. No pertinent surgical history.  OB History    No data available       Home Medications    Prior to Admission medications   Medication Sig Start Date End Date Taking? Authorizing Provider  clindamycin (CLEOCIN) 150 MG capsule Take 2 capsules (300 mg total) by mouth 4 (four) times daily for 10 days. 07/15/17 07/25/17 Yes Fawze, Mina A, PA-C  ibuprofen (ADVIL,MOTRIN) 200 MG tablet Take 200-400 mg by mouth every 6 (six) hours as needed for mild pain.    Yes [provider]  cephALEXin (KEFLEX) 500 MG capsule Take 1 capsule (500 mg total) by mouth 4 (four) times daily. Patient not taking: Reported on 07/21/2017 02/02/17   Demetrios Loll T, PA-C  doxycycline (VIBRAMYCIN) 100 MG capsule Take 1 capsule (100 mg total) by mouth 2 (two) times daily. Patient not taking: Reported on 07/21/2017 02/02/17   Demetrios Loll T,  PA-C  furosemide (LASIX) 20 MG tablet Take 1 tablet (20 mg total) by mouth daily for 3 days. Patient not taking: Reported on 07/21/2017 07/15/17 07/21/17  Michela Pitcher A, PA-C  oxyCODONE-acetaminophen (PERCOCET/ROXICET) 5-325 MG tablet Take 1 tablet by mouth every 8 (eight) hours as needed for severe pain. Patient not taking: Reported on 07/21/2017 02/07/17   Lanelle Bal, MD    Family History Family History  Problem Relation Age of Onset  . Hypertension Mother     Social History Social History   Tobacco Use  . Smoking status: Never Smoker  . Smokeless tobacco: Never Used  Substance Use Topics  . Alcohol use: Yes  . Drug use: No     Allergies   Patient has no known allergies.   Review of Systems Review of Systems  Constitutional: Negative for chills and fever.  Musculoskeletal: Positive for myalgias.       Right leg swelling  Skin: Positive for color change.  Allergic/Immunologic: Negative for immunocompromised state.  All other systems reviewed and are negative.    Physical Exam Updated Vital Signs BP (!) 163/80   Pulse 71   Temp 98 F (36.7 C) (Oral)   Resp 16   LMP 04/22/2011   SpO2 98%   Physical Exam  Constitutional: She appears well-developed and well-nourished. No distress.  HENT:  Head: Normocephalic and atraumatic.  Eyes: Conjunctivae are normal.  Cardiovascular: Normal rate, regular rhythm,  normal heart sounds and intact distal pulses.  Pulmonary/Chest: Effort normal and breath sounds normal.  Abdominal: Soft. Bowel sounds are normal.  Musculoskeletal:  Right anterior lower leg with dark erythema, warmth and edema. Erythema is not circumferential. Assoc tenderness. No fluctuance noted. Intact distal pulses. Normal sensation. No rash or wound.   Neurological: She is alert.  Skin: Skin is warm.  Psychiatric: She has a normal mood and affect. Her behavior is normal.  Nursing note and vitals reviewed.        ED Treatments / Results   Labs (all labs ordered are listed, but only abnormal results are displayed) Labs Reviewed  COMPREHENSIVE METABOLIC PANEL - Abnormal; Notable for the following components:      Result Value   Glucose, Bld 124 (*)    Calcium 8.3 (*)    Albumin 2.7 (*)    AST 134 (*)    ALT 104 (*)    Total Bilirubin 2.3 (*)    All other components within normal limits  CBC WITH DIFFERENTIAL/PLATELET - Abnormal; Notable for the following components:   MCV 102.5 (*)    MCH 35.1 (*)    Platelets 124 (*)    All other components within normal limits  CULTURE, BLOOD (ROUTINE X 2)  CULTURE, BLOOD (ROUTINE X 2)  CBC  CREATININE, SERUM  HIV ANTIBODY (ROUTINE TESTING)  BASIC METABOLIC PANEL  I-STAT CG4 LACTIC ACID, ED    EKG  EKG Interpretation None       Radiology No results found.  Procedures Procedures (including critical care time)  Medications Ordered in ED Medications  enoxaparin (LOVENOX) injection 40 mg (not administered)  acetaminophen (TYLENOL) tablet 650 mg (not administered)    Or  acetaminophen (TYLENOL) suppository 650 mg (not administered)  vancomycin (VANCOCIN) IVPB 1000 mg/200 mL premix (1,000 mg Intravenous New Bag/Given 07/21/17 1513)  HYDROcodone-acetaminophen (NORCO/VICODIN) 5-325 MG per tablet 1 tablet (1 tablet Oral Given 07/21/17 1514)     Initial Impression / Assessment and Plan / ED Course  I have reviewed the triage vital signs and the nursing notes.  Pertinent labs & imaging results that were available during my care of the patient were reviewed by me and considered in my medical decision making (see chart for details).  Clinical Course as of Jul 22 1719  Sun Jul 21, 2017  1637 Dr. Obie DredgeBlum accepting admission.  [JR]    Clinical Course User Index [JR] Deaunna Olarte, [place] N, PA-C    Patient presenting for subsequent visit with worsening cellulitis of right lower leg.  Patient seen on 07/15/2017 at this ED and prescribed clindamycin.  DVT study done that day was  negative.  Patient has been taking abx as prescribed, however states pain and redness is worsening.  On exam, able to compare images from visit on 07/15/2017, with evident worsening erythema and edema.  She is afebrile, nontoxic.  Labs today without leukocytosis, normal lactic acid, blood cultures sent.  Treated in the ED with IV vancomycin and Norco for pain.  Internal medicine accepted patient for admission.  Patient discussed with Dr. Dalene SeltzerSchlossman.  The patient appears reasonably stabilized for admission considering the current resources, flow, and capabilities available in the ED at this time, and I doubt any other [facility] requiring further screening and/or treatment in the ED prior to admission.  Final Clinical Impressions(s) / ED Diagnoses   Final diagnoses:  Cellulitis of right lower extremity    ED Discharge Orders    None  Larri Yehle, [place] N, PA-C 07/21/17 1721    Alvira Monday, MD 07/22/17 1423

## 2017-07-21 NOTE — H&P (Signed)
Date: 07/21/2017               Patient Name:  Mary Anderson MRN: [id number]  DOB: 05/13/1966 Age / Sex: 52 y.o., female   PCP: Patient, No Pcp Per         Medical Service: Internal Medicine Teaching Service         Attending Physician: Dr. Earl LagosNarendra, Nischal, MD    First Contact: Dr. Rozann LeschesMarybeth Rhya Shan Pager: [phone]  Second Contact: Dr. Eulah PontNina Blum Pager: [phone]       After Hours (After 5p/  First Contact Pager: [phone]  weekends / holidays): Second Contact Pager: [phone]   Chief Complaint: RLE swelling and redness  History of Present Illness:  Mary Anderson is a 52 yo with a PMH of HTN who is presents for evaluation of progressively worsening RLE erythema, swelling, and pain for the past 8-9 days. She first noticed the swelling a little over a week ago. She thinks it started after she cut herself shaving her legs in the shower. It started as a small area of erythema around her right ankle, but quickly progressed to swelling and erythema that extended from her ankle to mid shin. About 6 days ago the patient went to the ED for this erythema, swelling, and pain in her RLE. At that time doppler ultrasounds were ordered to rule out DVT as a cause of her changes. This study was negative and the patient was discharged from the ED with oral clindamycin 300 mg QID for 10 days on 07/15/2016. She was told to stay off her feet and to ice her legs/wear compression stockings as tolerated. She went to work 1 day after that ED visit, but has not been able to go to work since because of pain with standing on her RLE. In addition to worsening pain, she has noticed continued swelling and erythema of the RLE in spite of taking antibiotics as prescribed by ED physician. She denies chills, chest pain, shortness of breath, fevers, abdominal pain, change in bowel and bladder habits. Because of the continued swelling and pain with treatment, the patient decided to come to the ED for evaluation.   Upon arrival to the  ED the patient was found to be afebrile, nontachycardic, hypertensive up to 150/100 and saturating well on room air. Patient's WBC = 5.0, Hgb 14.1 with MCV 102.5, and platelets 124. CMP remarkable for Cr 0.72, AST =134, ALT = 104, and T bili 2.3. The patient was given dose of IV vancomycin and blood cultures were obtained. IMTS was called for admission.   Meds:  Current Meds  Medication Sig  . clindamycin (CLEOCIN) 150 MG capsule Take 2 capsules (300 mg total) by mouth 4 (four) times daily for 10 days.  Marland Kitchen. ibuprofen (ADVIL,MOTRIN) 200 MG tablet Take 200-400 mg by mouth every 6 (six) hours as needed for mild pain.    Allergies: Allergies as of 07/21/2017  . (No Known Allergies)   Past Medical History: Past Medical History:  Diagnosis Date  . Hypertension    Past Surgical History: History reviewed. No pertinent surgical history.  Family History:  Family History  Problem Relation Age of Onset  . Hypertension Mother    Social History:  Patient works on her feet 8 hours a day. Lives at home with husband. Both sons grown and live on their own. Patient denies cigarette use, chewing tobacco use, and illicit drug use. Drinks 1-2 glasses of wine up to 4 days a week.  Review of Systems: A complete ROS was negative except as per HPI.   Physical Exam: Blood pressure (!) 163/80, pulse 71, temperature 98 F (36.7 C), temperature source Oral, resp. rate 16, last menstrual period 04/22/2011, SpO2 98 %.  Physical Exam  Constitutional: She appears well-developed and well-nourished. No distress.  HENT:  Mouth/Throat: Oropharynx is clear and moist.  Cardiovascular: Normal rate and regular rhythm. Exam reveals no friction rub.  No murmur heard. Respiratory: Effort normal and breath sounds normal. No respiratory distress. She has no wheezes. She has no rales.  GI: Soft. Bowel sounds are normal. She exhibits no distension. There is no tenderness. There is no rebound.  Musculoskeletal: She exhibits  edema (Trace pitting edema to mid shin on L. 2+ pitting edema to knee on L, subjectively worse in dependent areas) and tenderness (Palpation of RLE).  Skin:  Area of diffuse erythema and warmth in RLE from ankle. Erythema worse around ankle. Small well healed <1cm laceration on lateral aspect of RLE above ankle. LLE shows signs of hyperpigmentation without ulceration.         Assessment & Plan by Problem: Active Problems:   Cellulitis  Chrystina Serratore is a 52 yo with a PMH of HTN who is presented to the ED after progressively worsening RLE erythema and swelling, consistent with nonpurulent cellulitis, in spite of treatment with clindamycin for 4 days. The patient was admitted t  RLE nonpurulent cellulitis: Patient received 1 dose of IV vancomycin in ED. Patient's exam most consistent with nonpurulent cellulitis that has failed outpatient management with oral clindamycin. Patient's RLE marked to assess improvement in AM. At previous ED visit patient had LE doppler ultrasound which did not show evidence of DVT. Patient remains nontachycardic, stable on room air, and without shortness of breath to suggest PE. Do not think additional workup for VTE is indicated. Since patient does not have leukocytosis, is afebrile and hemodynamically stable. Will consider transition to oral keflex in the AM if she remains clinically stable and if there is some improvement in erythema overnight (site marked for comparison).  -Consider keflex 500 mg q6 hours in AM for 7-10 days total antibiotic treatment -Torodal 15 mg q6 hours PRN and Tylenol 650 mg q6 hours PRN for pain management  Elevated AST/ALT: Patient admits to drinking alcohol, 1-2 glasses at least 4 days a week. Patient's AST and ALT elevated, along with increased T.Bili and decreased platelets, altogether suggestive of cirrhosis. Labs altogether stable from that observed in August 2018, but she does not appear to have been worked up for this condition. Patient  does not have PCP and will need follow up as an outpatient -PCP referral as outpatient  Hx of HTN: BP elevated to 170/80 on admission, repeat 160/80. Patient does not follow with PCP for chronic medical problems.  -Lisinopril 10 mg daily  FEN/GI: -Heart Healthy -No IVF, replace electrolytes as needed  VTE Prophylaxis: Lovenox daily Code Status: Full  Dispo: Admit patient to Observation with expected length of stay less than 2 midnights.  SignedRozann Lesches, MD 07/21/2017, 5:35 PM  Pager: [phone]

## 2017-07-21 NOTE — ED Triage Notes (Signed)
Pt presents for reevaluation of right leg swelling. Patient with little improvement, reports non weight bearing x 3 days. Taking clindamycin for possible cellulitis. Leg is very red.

## 2017-07-21 NOTE — Progress Notes (Signed)
[place], [place] with report on patient coming from ED. EMS brought patient to hospital with increasing cellulitis.  Pt of Teaching. Awaiting patient.

## 2017-07-22 DIAGNOSIS — L03115 Cellulitis of right lower limb: Secondary | ICD-10-CM

## 2017-07-22 DIAGNOSIS — R740 Nonspecific elevation of levels of transaminase and lactic acid dehydrogenase [LDH]: Secondary | ICD-10-CM

## 2017-07-22 DIAGNOSIS — I10 Essential (primary) hypertension: Secondary | ICD-10-CM

## 2017-07-22 LAB — BASIC METABOLIC PANEL
ANION GAP: 8 (ref 5–15)
BUN: 7 mg/dL (ref 6–20)
CALCIUM: 8.2 mg/dL — AB (ref 8.9–10.3)
CO2: 24 mmol/L (ref 22–32)
CREATININE: 0.63 mg/dL (ref 0.44–1.00)
Chloride: 106 mmol/L (ref 101–111)
Glucose, Bld: 109 mg/dL — ABNORMAL HIGH (ref 65–99)
Potassium: 4.1 mmol/L (ref 3.5–5.1)
Sodium: 138 mmol/L (ref 135–145)

## 2017-07-22 LAB — HIV ANTIBODY (ROUTINE TESTING W REFLEX): HIV SCREEN 4TH GENERATION: NONREACTIVE

## 2017-07-22 MED ORDER — CEPHALEXIN 500 MG PO CAPS: 500.0000 mg | ORAL_CAPSULE | Freq: Four times a day (QID) | ORAL | 0 refills | Status: AC

## 2017-07-22 MED ORDER — LISINOPRIL 10 MG PO TABS: 10.0000 mg | ORAL_TABLET | Freq: Every day | ORAL | 0 refills | Status: DC

## 2017-07-22 NOTE — Care Management Note (Signed)
Case Management Note  Patient Details  Name: Pearlie Oysterammy C Krager MRN: [id number] Date of Birth: 05/01/1966  Subjective/Objective:    Pt admitted with cellulitis of RLE. She is from home with her spouse. Pt does not have a PCP or insurance.                 Action/Plan: CM [facility] the [facility] and [facility] [place]. Pt to f/u with one of these clinics to establish with a PCP and have medication assistance.  CM provided her a discount card today to assist with the cost of her medications. The total for today should be less than $19. Pt states she can afford this amount.  Husband providing transportation home.  Expected Discharge Date:  07/22/17               Expected Discharge Plan:  Home/Self Care  In-House Referral:     Discharge planning Services  CM Consult, Indigent Health Clinic, Medication Assistance  Post Acute Care Choice:    Choice offered to:     DME Arranged:    DME Agency:     HH Arranged:    HH Agency:     Status of Service:  Completed, signed off  If discussed at [employer] Meetings, dates discussed:    Additional Comments:  Kermit BaloKelli F Justis Dupas, RN 07/22/2017, 3:12 PM

## 2017-07-22 NOTE — Progress Notes (Signed)
   Subjective:  Patient seen laying comfortably in bed this AM. Patient states that her swelling and pain have improved overnight. Was able to eat breakfast today without difficulty. Patient feels stable for discharge today.  Objective:  Vital signs in last 24 hours: Vitals:   07/21/17 1953 07/22/17 0129 07/22/17 0532 07/22/17 0926  BP: (!) 173/82 (!) 175/82 (!) 151/72 (!) 156/83  Pulse: 71 76 74 74  Resp: (!) 22 18 18 18   Temp: 97.6 F (36.4 C) 98 F (36.7 C) 98.4 F (36.9 C) 98 F (36.7 C)  TempSrc: Oral Oral Oral Oral  SpO2:  100% 100% 100%  Weight: 190 lb 0.6 oz (86.2 kg)     Height: 5\' 6"  (1.676 m)      Physical Exam  Constitutional: She appears well-developed and well-nourished. No distress.  HENT:  Mouth/Throat: Oropharynx is clear and moist.  Cardiovascular: Normal rate, regular rhythm and intact distal pulses.  Respiratory: Effort normal. No respiratory distress. She has no wheezes. She has no rales.  GI: Soft. She exhibits no distension. There is no tenderness. There is no rebound.  Musculoskeletal: She exhibits edema (1+ pitting edema of RLE from knee to ankle (worse in dependent areas), interval improvement noted from yesterday's exam) and tenderness (with palpation of RLE).  Skin:  Interval improvement in area of erythema, calor, and swelling on RLE. LLE with hyperpigmentation and chronic skin changes consistent with venous stasis, unchanged from previous exams.         Assessment/Plan:  Active Problems:   Cellulitis  Mary Anderson is a 52 yo with a PMH of HTN who is presented to the ED after progressively worsening RLE erythema and swelling, consistent with nonpurulent cellulitis, in spite of treatment with clindamycin for multiple days. The patient was admitted to the internal medicine teaching service for management. The specific problems addressed during admission were as follows:  RLE nonpurulent cellulitis: Patient received 1 dose of IV vancomycin in ED  and her physical exam is significantly improved from yesterday's exam. Will transition to oral antibiotics today, Keflex 500 mg q6 hours for 7 days total treatment. -Transition to Keflex for 7 days -[facility], with plan to switch to doxy if no improvement on above regimen  Elevated AST/ALT: On admission AST and ALT elevated, along with increased T.Bili and decreased platelets, altogether suggestive of cirrhosis. Patient will need [facility]. -[facility], run CMP, hepatitis serologies and possible RUQ ultrasound at that time  Hx of HTN: BP elevated to 170/80 on admission, repeat 160/80. Patient's BP 156/83 after starting lisinopril 10 mg daily. -Lisinopril 10 mg daily  FEN/GI: -Heart Healthy -No IVF, replace electrolytes as needed  VTE Prophylaxis: Lovenox daily Code Status: Full  Dispo: Anticipated discharge today.  Rozann LeschesNedrud, Tonya Carlile, MD 07/22/2017, 11:32 AM Pager: [phone]

## 2017-07-22 NOTE — Progress Notes (Signed)
Pt given discharge instructions and note for being out of work.  Pt taken down via wheelchair by NT.

## 2017-07-22 NOTE — Discharge Instructions (Signed)
° °  Please call the internal medicine clinic at [phone] to schedule an appointment for Thursday (1/24) of this week in the acute care clinic so that we can check on how your leg is doing.

## 2017-07-22 NOTE — Discharge Summary (Signed)
Name: Mary Anderson MRN: [id number] DOB: 06/12/1966 52 y.o. PCP: Patient, No Pcp Per  Date of Admission: 07/21/2017  1:17 PM Date of Discharge: 07/22/2017 Attending Physician: Earl LagosNarendra, Nischal, MD  Discharge Diagnosis: Active Problems:   Cellulitis  Discharge Medications: Allergies as of 07/22/2017   No Known Allergies     Medication List    STOP taking these medications   clindamycin 150 MG capsule Commonly known as:  CLEOCIN     TAKE these medications   cephALEXin 500 MG capsule Commonly known as:  KEFLEX Take 1 capsule (500 mg total) by mouth 4 (four) times daily for 6 days.   furosemide 20 MG tablet Commonly known as:  LASIX Take 1 tablet (20 mg total) by mouth daily for 3 days.   ibuprofen 200 MG tablet Commonly known as:  ADVIL,MOTRIN Take 200-400 mg by mouth every 6 (six) hours as needed for mild pain.   lisinopril 10 MG tablet Commonly known as:  PRINIVIL,ZESTRIL Take 1 tablet (10 mg total) by mouth daily. Start taking on:  07/23/2017       Disposition and follow-up:   Mary Anderson [facility] Good condition.  At the hospital follow up visit please address:  1.  Patient evaluated for progressively worsening RLE nonpurulent cellulitis which progressively worsened in spite of outpatient treatment with clindamycin. She received IV vancomycin with good clinical improvement. Discharged on oral Keflex for 7 days total antibiotic therapy. Please assess improvement in RLE swelling (see media tab for pictures).   Patient does not have PCP, please facilitate establishing care in the [facility] for other chronic medical problems including HTN and possible cirrhosis (see below).   2.  Labs / imaging needed at time of follow-up: CMP, hepatitis serologies, consider RUQ ultrasound  3.  Pending labs/ test needing follow-up: Blood cultures x2, HIV antibody screen  Follow-up Appointments: Follow-up Information    [facility]. Schedule an appointment as soon as possible for a visit in 4 day(s).   Contact information: [address] [place] [id number] [phone]          Hospital Course by problem list: Active Problems:   Cellulitis   Mary Anderson is a 52 yo with a PMH of HTN who is presented to the ED after progressively worsening RLE nonpurulent cellulitis, in spite of treatment with clindamycin for 4-5 days leading up to admission. Patient was afebrile and hemodynamically stable upon arrival to ED. She was admitted to the internal medicine teaching service for management. The specific problems addressed during admission are as follows:  RLE nonpurulent cellulitis: Patient originally diagnosed with nonpurulent cellulitis by ED physician 1 week prior to presentation. During that ED visit the patient had lower extremity ultrasounds to rule out DVT as a cause of her erythema and swelling. She was discharged with a course of oral clindamycin 300 mg QID, which the patient took as instructed for 4-5 days. Even though she took this medicine, her erythema and swelling of RLE progressed. She also developed worsening pain with standing and decided to return for reevaluation. On the current presentation, her physical exam was most consistent with nonpurulent cellulitis that failed outpatient management. She arrived afebrile, hemodynamically stable and without leukocytosis. She was given 1 dose of IV vancomycin in the ED with good clinical response and regression of the erythema and swelling overnight. The patient was discharged with oral Keflex 500 mg QID for 6 days (  7 days total therapy). Please assess for continued improvement in RLE swelling/erythema. Consider transition to doxycycline if swelling not resolved at follow up.  Elevated AST/ALT: The patient's labs on admission showed chronically elevated AST and ALT, along with increased T.Bili and decreased platelets. These lab values  were altogether suggestive of cirrhosis. Patient admitted to drinking 1-2 glasses of wine up to 4 days a week, but no liquor use. Patient does not have PCP to work up or follow elevated AST/ALT, so recommended establishing with [facility]. Patient will need repeat CMP, hepatitis serologies, and possible RUQ ultrasound at follow up if she would like to establish care with clinic.  Hx of HTN: BP elevated to 170/80 on admission, repeat 160/80. Patient was not on any anti-hypertensive medications on admission because she does not currently have PCP to manage chronic medical conditions. While inpatient she was started on lisinopril 10 mg daily, with repeat BP 150/80. Please assess how she is doing on this medication and if it needs further titration.   Discharge Vitals:   BP (!) 156/83 (BP Location: Right Arm)   Pulse 74   Temp 98 F (36.7 C) (Oral)   Resp 18   Ht 5\' 6"  (1.676 m)   Wt 190 lb 0.6 oz (86.2 kg)   LMP 04/22/2011   SpO2 100%   BMI 30.67 kg/m   Pertinent Labs, Studies, and Procedures:   CMP Latest Ref Rng & Units 07/22/2017 07/21/2017 07/21/2017  Glucose 65 - 99 mg/dL 161(W) - 960(A)  BUN 6 - 20 mg/dL 7 - 7  Creatinine 5.40 - 1.00 mg/dL 9.81 1.91 4.78  Sodium 135 - 145 mmol/L 138 - 136  Potassium 3.5 - 5.1 mmol/L 4.1 - 3.9  Chloride 101 - 111 mmol/L 106 - 104  CO2 22 - 32 mmol/L 24 - 22  Calcium 8.9 - 10.3 mg/dL 8.2(L) - 8.3(L)  Total Protein 6.5 - 8.1 g/dL - - 6.7  Total Bilirubin 0.3 - 1.2 mg/dL - - 2.3(H)  Alkaline Phos 38 - 126 U/L - - 92  AST 15 - 41 U/L - - 134(H)  ALT 14 - 54 U/L - - 104(H)   CBC Latest Ref Rng & Units 07/21/2017 07/21/2017 02/07/2017  WBC 4.0 - 10.5 K/uL 5.1 5.0 3.0(L)  Hemoglobin 12.0 - 15.0 g/dL 29.5 62.1 30.8  Hematocrit 36.0 - 46.0 % 39.5 41.2 35.0(L)  Platelets 150 - 400 K/uL 98(L) 124(L) 80(L)   iSTAT Lactic Acid = 1.75  Discharge Instructions: Discharge Instructions    Call MD for:  persistant nausea and vomiting   Complete by:  As directed     Call MD for:  redness, tenderness, or signs of infection (pain, swelling, redness, odor or green/yellow discharge around incision site)   Complete by:  As directed    Call MD for:  temperature >100.4   Complete by:  As directed    Diet - low sodium heart healthy   Complete by:  As directed    Discharge instructions   Complete by:  As directed    You were evaluated for worsening right lower extremity cellulitis. Because the antibiotics given during your last ED visit weren't successful in treating this infection, please start taking a new antibiotic. You will STOP taking clindamycin and START taking cephalexin (KEFLEX) 500 mg by mouth 4 times a day for the next 6 days. Please use the number provided on this paperwork to schedule an appointment for follow up in the internal medicine clinic on the ground  floor of [facility] hospital. This clinic can work as you primary care physician to help manage your chronic medical conditions, including high blood pressure (hypertension). We noticed your blood pressure was high during this hospitalization and started you on lisinopril 10 mg daily for your hypertension. Please follow up in the internal medicine clinic regarding this and your other chronic medical conditions.   Increase activity slowly   Complete by:  As directed       Signed: Rozann Lesches, MD 07/22/2017, 1:46 PM   Pager: [phone]

## 2017-07-26 LAB — CULTURE, BLOOD (ROUTINE X 2)
Culture: NO GROWTH
Culture: NO GROWTH
Special Requests: ADEQUATE
Special Requests: ADEQUATE

## 2017-07-31 ENCOUNTER — Other Ambulatory Visit: Payer: Self-pay

## 2017-07-31 ENCOUNTER — Ambulatory Visit: Payer: Self-pay | Admitting: Internal Medicine

## 2017-07-31 ENCOUNTER — Encounter: Payer: Self-pay | Admitting: Internal Medicine

## 2017-07-31 VITALS — BP 161/80 | HR 58 | Temp 97.6°F | Ht 65.0 in | Wt 188.0 lb

## 2017-07-31 DIAGNOSIS — F1099 Alcohol use, unspecified with unspecified alcohol-induced disorder: Secondary | ICD-10-CM

## 2017-07-31 DIAGNOSIS — Z79899 Other long term (current) drug therapy: Secondary | ICD-10-CM

## 2017-07-31 DIAGNOSIS — R7401 Elevation of levels of liver transaminase levels: Secondary | ICD-10-CM | POA: Insufficient documentation

## 2017-07-31 DIAGNOSIS — I10 Essential (primary) hypertension: Secondary | ICD-10-CM

## 2017-07-31 DIAGNOSIS — L03115 Cellulitis of right lower limb: Secondary | ICD-10-CM

## 2017-07-31 DIAGNOSIS — Z0189 Encounter for other specified special examinations: Secondary | ICD-10-CM

## 2017-07-31 DIAGNOSIS — R740 Nonspecific elevation of levels of transaminase and lactic acid dehydrogenase [LDH]: Secondary | ICD-10-CM

## 2017-07-31 MED ORDER — DOXYCYCLINE HYCLATE 100 MG PO TABS: 100.0000 mg | ORAL_TABLET | Freq: Two times a day (BID) | ORAL | 0 refills | Status: AC

## 2017-07-31 MED ORDER — FUROSEMIDE 20 MG PO TABS: 20.0000 mg | ORAL_TABLET | Freq: Every day | ORAL | 0 refills | Status: DC

## 2017-07-31 MED ORDER — HYDROCHLOROTHIAZIDE 12.5 MG PO TABS: 12.5000 mg | ORAL_TABLET | Freq: Every day | ORAL | 1 refills | Status: DC

## 2017-07-31 MED ORDER — LISINOPRIL 20 MG PO TABS: 20.0000 mg | ORAL_TABLET | Freq: Every day | ORAL | 1 refills | Status: DC

## 2017-07-31 NOTE — Patient Instructions (Addendum)
Nice to meet you today Ms. Mary Anderson.  For your leg we are going to try a different antibiotic to cover a different type of bacteria that may be causing this infection.  We will also provide some more of the Lasix pills to help with the swelling, and continue to use the compression stocking to help with this as well.  You can take ibuprofen to help with the pain, but avoiding Tylenol may be better for the time being. If it doesn't get better or gets worse, don't hesitate to call.   For your blood pressure, because it is still little high we are going to increase the dose of the lisinopril medicine to 20 mg a day and add a medicine called HCTZ or hydrochlorothiazide that you will also take once a day.   For your liver, we recommend working up further with different labs and ultrasound but will hold off until you are able to get the financial application in process.

## 2017-07-31 NOTE — Progress Notes (Signed)
   CC: Hospital follow-up, establish care  HPI:  [name] is a 52 y.o. female with a past medical history of hypertension, alcohol use, transaminitis, cellulitis who presents to the clinic for follow-up after hospitalization for cellulitis and to establish care.  She was hospitalized from 1/20-1/21 for cellulitis of her right lower extremity which had failed outpatient antibiotics.  After receiving IV vancomycin with improvement in her symptoms she was transitioned to oral Keflex and discharged.  Labs during the admission also noted elevated transaminases, increased T bili and a low platelet count in the setting of alcohol use history.  She was also hypertensive during the admission and was started on lisinopril 10 mg at discharge.  Cellulitis: She reports she stayed off her feet for several days following her admission and went back to work yesterday.  She reports her pain and discomfort initially improved but has been worsening over the last few days.  She reports no significant change in her redness.  Her associated edema also initially improved with several doses of Lasix prescribed on discharge, however has again increased.  HTN: She has been taking lisinopril as prescribed with no perceived side effects, no headache, blurry vision.  Transaminitis/Alcohol: She was unaware of the concerns regarding her liver function uncovered on lab work during admission.  She reports she has used 3-4 glasses of wine per week (1 glass after dinner about every other night) for the last 2-3 years with no other periods of heavier drinking, no drug use.  She has not been drinking since hospital discharge.  She denies any scleral icterus or abdominal swelling.  Past Medical History:  Diagnosis Date  . Hypertension    Family History:  Family History  Problem Relation Age of Onset  . Hypertension Mother      Social History:  Social History   Tobacco Use  . Smoking status: Never Smoker  .  Smokeless tobacco: Never Used  Substance Use Topics  . Alcohol use: Yes  . Drug use: No   Two grown children, 3-4 glasses of wine per week x 2-3 years, no drug use.   Review of Systems:  ROS   Physical Exam:  Vitals:   07/31/17 1427  BP: (!) 161/80  Pulse: (!) 58  Temp: 97.6 F (36.4 C)  TempSrc: Oral  SpO2: 100%  Weight: 188 lb (85.3 kg)  Height: 5\' 5"  (1.651 m)   General: Sitting in chair comfortably, no acute distress Head: Normocephalic, atraumatic  Eyes: No scleral icterus, EOMI ENT: Moist mucous membranes, no pharyngeal exudate CV: Regular rate and rhythm at time of exam, Systolic murmur  Resp: Clear breath sounds bilaterally, normal work of breathing, no distress  Abd: Soft, +BS, nontender to palpation, no fluid wave appreciated, slightly irregular/nodular liver edge palpated just inferior to the costal margin Extr: Area of erythema, warmth to anterior and lateral right lower leg similar to previously documented clinical photos, very tender to mild palpation, edema present Neuro: Alert and oriented x3 Skin: Warm, dry      Assessment & Plan:   See Encounters Tab for problem based charting.  Patient discussed with Dr. Rogelia BogaButcher

## 2017-08-01 ENCOUNTER — Encounter: Payer: Self-pay | Admitting: Internal Medicine

## 2017-08-01 NOTE — Assessment & Plan Note (Signed)
Blood pressure today 161/80.  She has been untreated for many years and has been tolerating lisinopril 10 mg over the last several days.  Given the degree of elevation she will likely need both an increased dose of ACE inhibitor as well as an additional agent for better blood pressure control.  --Increase Lisinopril to 20 mg --Add HCTZ 12.5 mg

## 2017-08-01 NOTE — Assessment & Plan Note (Signed)
Patient was incidentally found to have transaminitis on lab work with other values concerning for potential cirrhosis.  AST 134, ALT 104, albumin 2.7, total bili 2.3, platelets 124.  Though her alcohol history as reported his not very impressive, her AST is elevated to a greater degree and she does have macrocytosis on CBC.  Other potential etiologies include chronic hepatitis infection, autoimmune.  Patient does not currently have insurance.  It was discussed that recommended workup would include labs for hepatitis infections, PT/INR, and right upper quadrant ultrasound to further quantify and investigate the etiology of her liver dysfunction.  At this point, the patient would like to defer these labs until her orange card application is processed.  She was encouraged to quickly gather the necessary paperwork and keep follow-up appointments.  She was also advised to limit her use of Tylenol for her leg pain and use ibuprofen as an alternative.  --F/u 1-2 months or when orange card is processed for hepatitis labs, PT/INR, RUQ [place]

## 2017-08-01 NOTE — Assessment & Plan Note (Signed)
She was recently hospitalized for right lower extremity cellulite which improved with IV vancomycin.  She was transitioned to oral Keflex and discharged, however she has not had significant improvement in the appearance of her leg with erythema, warmth, and tenderness to light touch.  She has not responded to clindamycin or Keflex.  We can also trial a course of doxycycline and monitor for improvement.  --Doxycycline 100 mg BID x 14 days --Lasix 20 mg #7 for symptomatic edema   --Return precautions provided

## 2017-08-02 NOTE — Progress Notes (Signed)
Internal Medicine Clinic Attending  Case discussed with Dr. Harden at the time of the visit.  We reviewed the resident's history and exam and pertinent patient test results.  I agree with the assessment, diagnosis, and plan of care documented in the resident's note.  

## 2017-08-18 ENCOUNTER — Other Ambulatory Visit: Payer: Self-pay | Admitting: Internal Medicine

## 2017-08-28 ENCOUNTER — Encounter: Payer: Self-pay | Admitting: Internal Medicine

## 2017-08-29 ENCOUNTER — Emergency Department (HOSPITAL_COMMUNITY): Payer: Self-pay

## 2017-08-29 ENCOUNTER — Ambulatory Visit: Payer: Self-pay | Admitting: Internal Medicine

## 2017-08-29 ENCOUNTER — Other Ambulatory Visit: Payer: Self-pay

## 2017-08-29 ENCOUNTER — Encounter (HOSPITAL_COMMUNITY): Payer: Self-pay

## 2017-08-29 ENCOUNTER — Inpatient Hospital Stay (HOSPITAL_COMMUNITY)
Admission: EM | Admit: 2017-08-29 | Discharge: 2017-09-02 | DRG: 445 | Disposition: A | Discharge status: Home / Self Care | Payer: Self-pay | Attending: Oncology | Admitting: Oncology

## 2017-08-29 VITALS — BP 151/86 | HR 101 | Temp 101.6°F | Ht 64.0 in | Wt 178.8 lb

## 2017-08-29 DIAGNOSIS — R7401 Elevation of levels of liver transaminase levels: Secondary | ICD-10-CM | POA: Diagnosis present

## 2017-08-29 DIAGNOSIS — Z23 Encounter for immunization: Secondary | ICD-10-CM

## 2017-08-29 DIAGNOSIS — R1011 Right upper quadrant pain: Secondary | ICD-10-CM

## 2017-08-29 DIAGNOSIS — I10 Essential (primary) hypertension: Secondary | ICD-10-CM | POA: Diagnosis present

## 2017-08-29 DIAGNOSIS — R740 Nonspecific elevation of levels of transaminase and lactic acid dehydrogenase [LDH]: Secondary | ICD-10-CM

## 2017-08-29 DIAGNOSIS — R768 Other specified abnormal immunological findings in serum: Secondary | ICD-10-CM

## 2017-08-29 DIAGNOSIS — R791 Abnormal coagulation profile: Secondary | ICD-10-CM | POA: Diagnosis present

## 2017-08-29 DIAGNOSIS — I959 Hypotension, unspecified: Secondary | ICD-10-CM | POA: Diagnosis present

## 2017-08-29 DIAGNOSIS — N12 Tubulo-interstitial nephritis, not specified as acute or chronic: Secondary | ICD-10-CM | POA: Diagnosis present

## 2017-08-29 DIAGNOSIS — D696 Thrombocytopenia, unspecified: Secondary | ICD-10-CM | POA: Diagnosis present

## 2017-08-29 DIAGNOSIS — D539 Nutritional anemia, unspecified: Secondary | ICD-10-CM | POA: Diagnosis present

## 2017-08-29 DIAGNOSIS — R339 Retention of urine, unspecified: Secondary | ICD-10-CM

## 2017-08-29 DIAGNOSIS — B182 Chronic viral hepatitis C: Secondary | ICD-10-CM

## 2017-08-29 DIAGNOSIS — K8020 Calculus of gallbladder without cholecystitis without obstruction: Principal | ICD-10-CM | POA: Diagnosis present

## 2017-08-29 DIAGNOSIS — E8809 Other disorders of plasma-protein metabolism, not elsewhere classified: Secondary | ICD-10-CM | POA: Diagnosis present

## 2017-08-29 DIAGNOSIS — N179 Acute kidney failure, unspecified: Secondary | ICD-10-CM | POA: Diagnosis present

## 2017-08-29 DIAGNOSIS — R6510 Systemic inflammatory response syndrome (SIRS) of non-infectious origin without acute organ dysfunction: Secondary | ICD-10-CM

## 2017-08-29 DIAGNOSIS — R011 Cardiac murmur, unspecified: Secondary | ICD-10-CM | POA: Diagnosis present

## 2017-08-29 DIAGNOSIS — R161 Splenomegaly, not elsewhere classified: Secondary | ICD-10-CM | POA: Diagnosis present

## 2017-08-29 DIAGNOSIS — Z79899 Other long term (current) drug therapy: Secondary | ICD-10-CM

## 2017-08-29 DIAGNOSIS — B1920 Unspecified viral hepatitis C without hepatic coma: Secondary | ICD-10-CM | POA: Diagnosis present

## 2017-08-29 DIAGNOSIS — B9620 Unspecified Escherichia coli [E. coli] as the cause of diseases classified elsewhere: Secondary | ICD-10-CM | POA: Diagnosis present

## 2017-08-29 DIAGNOSIS — K7460 Unspecified cirrhosis of liver: Secondary | ICD-10-CM | POA: Diagnosis present

## 2017-08-29 DIAGNOSIS — K810 Acute cholecystitis: Secondary | ICD-10-CM

## 2017-08-29 DIAGNOSIS — E860 Dehydration: Secondary | ICD-10-CM | POA: Diagnosis present

## 2017-08-29 LAB — URINALYSIS, ROUTINE W REFLEX MICROSCOPIC
Bacteria, UA: NONE SEEN
Bilirubin Urine: NEGATIVE
Glucose, UA: 50 mg/dL — AB
KETONES UR: NEGATIVE mg/dL
Nitrite: NEGATIVE
PH: 5 (ref 5.0–8.0)
Protein, ur: 100 mg/dL — AB
SPECIFIC GRAVITY, URINE: 1.017 (ref 1.005–1.030)

## 2017-08-29 LAB — POCT URINALYSIS DIPSTICK
Bilirubin, UA: NEGATIVE
Glucose, UA: NEGATIVE
Ketones, UA: NEGATIVE
NITRITE UA: POSITIVE
PH UA: 6 (ref 5.0–8.0)
PROTEIN UA: 100
Spec Grav, UA: 1.025 (ref 1.010–1.025)
Urobilinogen, UA: 0.2 E.U./dL

## 2017-08-29 LAB — COMPREHENSIVE METABOLIC PANEL
ALBUMIN: 2.8 g/dL — AB (ref 3.5–5.0)
ALT: 106 U/L — ABNORMAL HIGH (ref 14–54)
AST: 123 U/L — ABNORMAL HIGH (ref 15–41)
Alkaline Phosphatase: 84 U/L (ref 38–126)
Anion gap: 8 (ref 5–15)
BUN: 15 mg/dL (ref 6–20)
CHLORIDE: 101 mmol/L (ref 101–111)
CO2: 23 mmol/L (ref 22–32)
Calcium: 8.1 mg/dL — ABNORMAL LOW (ref 8.9–10.3)
Creatinine, Ser: 1.07 mg/dL — ABNORMAL HIGH (ref 0.44–1.00)
GFR calc Af Amer: >60 mL/min (ref >60)
GFR calc non Af Amer: 59 mL/min — ABNORMAL LOW (ref >60)
GLUCOSE: 118 mg/dL — AB (ref 65–99)
POTASSIUM: 4.2 mmol/L (ref 3.5–5.1)
Sodium: 132 mmol/L — ABNORMAL LOW (ref 135–145)
Total Bilirubin: 2.5 mg/dL — ABNORMAL HIGH (ref 0.3–1.2)
Total Protein: 7 g/dL (ref 6.5–8.1)

## 2017-08-29 LAB — I-STAT CG4 LACTIC ACID, ED: LACTIC ACID, VENOUS: 0.94 mmol/L (ref 0.5–1.9)

## 2017-08-29 LAB — CBC
HCT: 35.9 % — ABNORMAL LOW (ref 36.0–46.0)
Hemoglobin: 12.2 g/dL (ref 12.0–15.0)
MCH: 34.5 pg — AB (ref 26.0–34.0)
MCHC: 34 g/dL (ref 30.0–36.0)
MCV: 101.4 fL — AB (ref 78.0–100.0)
Platelets: 91 10*3/uL — ABNORMAL LOW (ref 150–400)
RBC: 3.54 MIL/uL — ABNORMAL LOW (ref 3.87–5.11)
RDW: 14.2 % (ref 11.5–15.5)
WBC: 7.3 10*3/uL (ref 4.0–10.5)

## 2017-08-29 LAB — LIPASE, BLOOD: Lipase: 36 U/L (ref 11–51)

## 2017-08-29 LAB — I-STAT BETA HCG BLOOD, ED (MC, WL, AP ONLY)

## 2017-08-29 MED ORDER — SODIUM CHLORIDE 0.9 % IV SOLN
1.0000 g | Freq: Once | INTRAVENOUS | Status: AC
  Administered 2017-08-30: 1 g via INTRAVENOUS
  Filled 2017-08-29: qty 10

## 2017-08-29 MED ORDER — MORPHINE SULFATE (PF) 4 MG/ML IV SOLN
4.0000 mg | Freq: Once | INTRAVENOUS | Status: AC
  Administered 2017-08-30: 4 mg via INTRAVENOUS
  Filled 2017-08-29: qty 1

## 2017-08-29 MED ORDER — METRONIDAZOLE IN NACL 5-0.79 MG/ML-% IV SOLN
500.0000 mg | Freq: Once | INTRAVENOUS | Status: AC
  Administered 2017-08-30: 500 mg via INTRAVENOUS
  Filled 2017-08-29: qty 100

## 2017-08-29 MED ORDER — ONDANSETRON HCL 4 MG/2ML IJ SOLN
4.0000 mg | Freq: Once | INTRAMUSCULAR | Status: AC
  Administered 2017-08-30: 4 mg via INTRAVENOUS
  Filled 2017-08-29: qty 2

## 2017-08-29 MED ORDER — SODIUM CHLORIDE 0.9 % IV SOLN: INTRAVENOUS | Status: DC

## 2017-08-29 MED ORDER — IBUPROFEN 400 MG PO TABS
600.0000 mg | ORAL_TABLET | Freq: Once | ORAL | Status: AC
  Administered 2017-08-29: 600 mg via ORAL
  Filled 2017-08-29: qty 1

## 2017-08-29 NOTE — ED Provider Notes (Signed)
TIME SEEN: 11:22 PM  CHIEF COMPLAINT: Fever, abdominal pain  HPI: Patient is a 52 year old female with history of hypertension who presents emergency department with a week of right upper quadrant abdominal pain is worse with palpation and eating.  Also reports fever that started today.  Was seen by internal medicine and sent here for further evaluation.  Denies nausea or vomiting but has had diarrhea.  No bloody stool or melena.  Reports dysuria but no hematuria.  States she drinks alcohol occasionally.  No recent heavy Tylenol use.  No history of liver failure.  ROS: See HPI Constitutional:  fever  Eyes: no drainage  ENT: no runny nose   Cardiovascular:  no chest pain  Resp: no SOB  GI: no vomiting GU:  dysuria Integumentary: no rash  Allergy: no hives  Musculoskeletal: no leg swelling  Neurological: no slurred speech ROS otherwise negative  PAST MEDICAL HISTORY/PAST SURGICAL HISTORY:  Past Medical History:  Diagnosis Date  . Hypertension     MEDICATIONS:  Prior to Admission medications   Medication Sig Start Date End Date Taking? Authorizing Provider  furosemide (LASIX) 20 MG tablet Take 1 tablet (20 mg total) by mouth daily for 7 days. 07/31/17 08/07/17  Ginger Carne, MD  hydrochlorothiazide (HYDRODIURIL) 12.5 MG tablet Take 1 tablet (12.5 mg total) by mouth daily. 07/31/17   Ginger Carne, MD  ibuprofen (ADVIL,MOTRIN) 200 MG tablet Take 200-400 mg by mouth every 6 (six) hours as needed for mild pain.     [provider]  lisinopril (PRINIVIL,ZESTRIL) 20 MG tablet Take 1 tablet (20 mg total) by mouth daily. 07/31/17   Ginger Carne, MD    ALLERGIES:  No Known Allergies  SOCIAL HISTORY:  Social History   Tobacco Use  . Smoking status: Never Smoker  . Smokeless tobacco: Never Used  Substance Use Topics  . Alcohol use: Yes    FAMILY HISTORY: Family History  Problem Relation Age of Onset  . Hypertension Mother     EXAM: BP (!) 106/49   Pulse 68   Temp  98.3 F (36.8 C) (Oral)   Resp 18   LMP 04/22/2011   SpO2 99%  CONSTITUTIONAL: Alert and oriented and responds appropriately to questions. Well-appearing; well-nourished HEAD: Normocephalic EYES: Conjunctivae clear, pupils appear equal, EOMI ENT: normal nose; moist mucous membranes NECK: Supple, no meningismus, no nuchal rigidity, no LAD  CARD: RRR; S1 and S2 appreciated; no murmurs, no clicks, no rubs, no gallops RESP: Normal chest excursion without splinting or tachypnea; breath sounds clear and equal bilaterally; no wheezes, no rhonchi, no rales, no hypoxia or respiratory distress, speaking full sentences ABD/GI: Normal bowel sounds; non-distended; soft, tender in the right upper quadrant with positive Murphy sign, no rebound, no guarding, no peritoneal signs, no hepatosplenomegaly BACK:  The back appears normal and is non-tender to palpation, there is no CVA tenderness EXT: Normal ROM in all joints; non-tender to palpation; no edema; normal capillary refill; no cyanosis, no calf tenderness or swelling    SKIN: Normal color for age and race; warm; no rash NEURO: Moves all extremities equally PSYCH: The patient's mood and manner are appropriate. Grooming and personal hygiene are appropriate.  MEDICAL DECISION MAKING: Patient here with right upper quadrant pain.  Febrile here.  No other infectious symptoms other than dysuria.  Ultrasound shows acute cholecystitis.  Will give ceftriaxone and Flagyl.  Will keep patient n.p.o. and start IV fluids.  Will discuss with surgery for evaluation.  ED PROGRESS: Labs show no leukocytosis.  Elevated liver function test which she has had previously.  Normal lipase.  Urine shows 6-30 red blood cells and 6-30 white blood cells but no bacteria.    1:20 AM  D/w Dr. Andrey CampanileWilson with general surgery.  Appreciate his help.  He will reviewed patient's case.   1:51 AM general surgeon recommended internal medicine admission and they will see patient in consult.  I  have discussed patient's case with IM resident.  I have recommended admission and patient (and family if present) agree with this plan. Admitting physician will place admission orders.   I reviewed all nursing notes, vitals, pertinent previous records, EKGs, lab and urine results, imaging (as available).     Ward, Layla MawKristen N, DO 08/30/17 [phone]

## 2017-08-29 NOTE — ED Notes (Signed)
Pt alert and oriented x4. Ambulatory to room with steady gait. Pt afebrile. Skin warm and dry. Respirations equal and unlabored. s1 and s2 heart sounds audble. Abdomen soft and nondistended. Pain with hard palpation. In right lower and upper quadrants.

## 2017-08-29 NOTE — Assessment & Plan Note (Signed)
She is reporting symptoms of right upper quadrant abdominal pain for the past week. She's also having a sensation of difficulty completely emptying her bladder, dysuria, darkening of her urine, and  loose bowel movements. She denies flank pain or chills, nausea or vomiting, decreased appetite.  For the past two weeks she's had cough productive of white sputum, this has been progressively getting better over the past few days. The cough was associated with very mild sinus pressure, rhinnorhea, and sore throat all of which have resolved with cough drops and cold medicine. She was not having muscle aches. Of note she was previously hospitalized for cellulitis, this has resolved with a course of antibiotics and use of support dressings.  She was also noted to have transaminitis at the time of admission in a pattern which may be consistent with alcoholic hepatitis, she was holding off on further workup of this until she has orange card coverage.    Temp > 38*C and heart rate > 90 so she meets SIRS criteria, the source of infection is not completely clear at this time but the prominent feature being right upper quadrant pain. Presentation is concerning for cholecystitis vs. Choledocholithiasis vs. Hepatitis. Pancreatitis and Cholangitis are also on the differential.   - STAT CBC, CMET, Lipase  - Hep B and C testing   - transported to the ED for right upper quadrant ultrasound  - schedule for appointment to follow up with Mary Anderson

## 2017-08-29 NOTE — Progress Notes (Signed)
Report called to ED Charge RN Elliot Gurney(Woody). Patient escorted by RN to ED via pt transport chair. Kinnie FeilL. Brock Larmon, RN, BSN

## 2017-08-29 NOTE — ED Triage Notes (Signed)
Pt sent to ED from IM clinic for RUQ abdominal pain x 1 week, urinary symptoms (dysuria, dark urine, not emptying bladder), productive cough with white sputum x 2 weeks. Dr. Obie DredgeBlum requesting pt has cbc, cmet, lipase, hep b and c testing, and RUQ US.

## 2017-08-29 NOTE — Progress Notes (Signed)
   CC: abdominal pain   HPI:  MaryMary Anderson is a 52 y.o. with PMH of hypertension who presents for fever and abdominal pain. Please see the assessment and plans for the status of the patient chronic medical problems.   Past Medical History:  Diagnosis Date  . Hypertension    Review of Systems: Refer to history of present illness and assessment and plans for pertinent review of systems, all others reviewed and negative  Physical Exam:  Vitals:   08/29/17 1502  BP: (!) 151/86  Pulse: (!) 101  Temp: (!) 101.6 F (38.7 C)  TempSrc: Oral  SpO2: 100%  Weight: 178 lb 12.8 oz (81.1 kg)  Height: 5\' 4"  (1.626 m)   General: tired appearing, she appears diaphoretic  HEENT: scleral icterus can be appreciated with retraction of her eyelid, yellowing can be seen over the posterior palate, no yellowing of the frenulum Gastrointestinal: delayed bowel sounds, tenderness to palpation of the right upper quadrant, positive murphys sign, no suprapubic tenderness  Skin: warm to touch, no jaundice of the skin   Assessment & Plan:   SIRS She is reporting symptoms of right upper quadrant abdominal pain for the past week. She's also having a sensation of difficulty completely emptying her bladder, dysuria, darkening of her urine, and  loose bowel movements. She denies flank pain or chills, nausea or vomiting, decreased appetite.  For the past two weeks she's had cough productive of white sputum, this has been progressively getting better over the past few days. The cough was associated with very mild sinus pressure, rhinnorhea, and sore throat all of which have resolved with cough drops and cold medicine. She was not having muscle aches. Of note she was previously hospitalized for cellulitis, this has resolved with a course of antibiotics and use of support dressings.  She was also noted to have transaminitis at the time of admission in a pattern which may be consistent with alcoholic hepatitis, she was  holding off on further workup of this until she has orange card coverage.    Temp > 38*C and heart rate > 90 so she meets SIRS criteria, the source of infection is not completely clear at this time but the prominent feature being right upper quadrant pain. Presentation is concerning for cholecystitis vs. Choledocholithiasis vs. Hepatitis. Pancreatitis and Cholangitis are also on the differential.   - STAT CBC, CMET, Lipase  - Hep B and C testing   - transported to the ED for right upper quadrant ultrasound  - schedule for appointment to follow up with Mary Anderson   See Encounters Tab for problem based charting.  Patient seen with Dr. Oswaldo DoneVincent

## 2017-08-30 ENCOUNTER — Inpatient Hospital Stay (HOSPITAL_COMMUNITY): Payer: Self-pay

## 2017-08-30 ENCOUNTER — Other Ambulatory Visit (HOSPITAL_COMMUNITY): Payer: Self-pay

## 2017-08-30 DIAGNOSIS — B182 Chronic viral hepatitis C: Secondary | ICD-10-CM

## 2017-08-30 DIAGNOSIS — I10 Essential (primary) hypertension: Secondary | ICD-10-CM

## 2017-08-30 LAB — CBC WITH DIFFERENTIAL/PLATELET
Basophils Absolute: 0 10*3/uL (ref 0.0–0.1)
Basophils Relative: 0 %
EOS ABS: 0.1 10*3/uL (ref 0.0–0.7)
Eosinophils Relative: 1 %
HCT: 35.5 % — ABNORMAL LOW (ref 36.0–46.0)
HEMOGLOBIN: 12.2 g/dL (ref 12.0–15.0)
LYMPHS ABS: 1.2 10*3/uL (ref 0.7–4.0)
Lymphocytes Relative: 14 %
MCH: 34.8 pg — ABNORMAL HIGH (ref 26.0–34.0)
MCHC: 34.4 g/dL (ref 30.0–36.0)
MCV: 101.1 fL — ABNORMAL HIGH (ref 78.0–100.0)
MONO ABS: 2.1 10*3/uL — AB (ref 0.1–1.0)
MONOS PCT: 24 %
NEUTROS PCT: 61 %
Neutro Abs: 5.4 10*3/uL (ref 1.7–7.7)
Platelets: 79 10*3/uL — ABNORMAL LOW (ref 150–400)
RBC: 3.51 MIL/uL — ABNORMAL LOW (ref 3.87–5.11)
RDW: 14.3 % (ref 11.5–15.5)
WBC: 8.7 10*3/uL (ref 4.0–10.5)

## 2017-08-30 LAB — COMPREHENSIVE METABOLIC PANEL
ALBUMIN: 2.7 g/dL — AB (ref 3.5–5.0)
ALK PHOS: 82 U/L (ref 38–126)
ALK PHOS: 84 U/L (ref 38–126)
ALT: 100 U/L — ABNORMAL HIGH (ref 14–54)
ALT: 94 U/L — ABNORMAL HIGH (ref 14–54)
ANION GAP: 10 (ref 5–15)
ANION GAP: 9 (ref 5–15)
AST: 104 U/L — ABNORMAL HIGH (ref 15–41)
AST: 111 U/L — ABNORMAL HIGH (ref 15–41)
Albumin: 2.8 g/dL — ABNORMAL LOW (ref 3.5–5.0)
BILIRUBIN TOTAL: 2.6 mg/dL — AB (ref 0.3–1.2)
BUN: 20 mg/dL (ref 6–20)
BUN: 22 mg/dL — ABNORMAL HIGH (ref 6–20)
CALCIUM: 7.8 mg/dL — AB (ref 8.9–10.3)
CALCIUM: 8 mg/dL — AB (ref 8.9–10.3)
CO2: 22 mmol/L (ref 22–32)
CO2: 22 mmol/L (ref 22–32)
Chloride: 100 mmol/L — ABNORMAL LOW (ref 101–111)
Chloride: 101 mmol/L (ref 101–111)
Creatinine, Ser: 1.44 mg/dL — ABNORMAL HIGH (ref 0.44–1.00)
Creatinine, Ser: 1.48 mg/dL — ABNORMAL HIGH (ref 0.44–1.00)
GFR calc non Af Amer: 40 mL/min — ABNORMAL LOW (ref >60)
GFR calc non Af Amer: 41 mL/min — ABNORMAL LOW (ref >60)
GFR, EST AFRICAN AMERICAN: 46 mL/min — AB (ref >60)
GFR, EST AFRICAN AMERICAN: 47 mL/min — AB (ref >60)
GLUCOSE: 136 mg/dL — AB (ref 65–99)
Glucose, Bld: 124 mg/dL — ABNORMAL HIGH (ref 65–99)
Potassium: 3.8 mmol/L (ref 3.5–5.1)
Potassium: 4.1 mmol/L (ref 3.5–5.1)
SODIUM: 131 mmol/L — AB (ref 135–145)
Sodium: 133 mmol/L — ABNORMAL LOW (ref 135–145)
TOTAL PROTEIN: 6.4 g/dL — AB (ref 6.5–8.1)
TOTAL PROTEIN: 6.8 g/dL (ref 6.5–8.1)
Total Bilirubin: 2.7 mg/dL — ABNORMAL HIGH (ref 0.3–1.2)

## 2017-08-30 LAB — CBC
HEMATOCRIT: 35.2 % — AB (ref 36.0–46.0)
HEMOGLOBIN: 12 g/dL (ref 12.0–15.0)
MCH: 34.6 pg — AB (ref 26.0–34.0)
MCHC: 34.1 g/dL (ref 30.0–36.0)
MCV: 101.4 fL — ABNORMAL HIGH (ref 78.0–100.0)
Platelets: 77 10*3/uL — ABNORMAL LOW (ref 150–400)
RBC: 3.47 MIL/uL — ABNORMAL LOW (ref 3.87–5.11)
RDW: 14.3 % (ref 11.5–15.5)
WBC: 9.4 10*3/uL (ref 4.0–10.5)

## 2017-08-30 LAB — PROTIME-INR
INR: 1.25
Prothrombin Time: 15.6 seconds — ABNORMAL HIGH (ref 11.4–15.2)

## 2017-08-30 LAB — URINALYSIS, ROUTINE W REFLEX MICROSCOPIC
Bilirubin, UA: NEGATIVE
Glucose, UA: NEGATIVE
KETONES UA: NEGATIVE
Nitrite, UA: POSITIVE — AB
PH UA: 5 (ref 5.0–7.5)
SPEC GRAV UA: 1.014 (ref 1.005–1.030)
Urobilinogen, Ur: 0.2 mg/dL (ref 0.2–1.0)

## 2017-08-30 LAB — MICROSCOPIC EXAMINATION
Casts: NONE SEEN /lpf
EPITHELIAL CELLS (NON RENAL): NONE SEEN /HPF (ref 0–10)
RBC, UA: >30 /hpf — AB (ref 0–?)
WBC, UA: >30 /hpf — AB (ref 0–?)

## 2017-08-30 LAB — ECHOCARDIOGRAM COMPLETE
Height: 64 in
WEIGHTICAEL: 2860.69 [oz_av]

## 2017-08-30 LAB — LIPASE, BLOOD: Lipase: 44 U/L (ref 11–51)

## 2017-08-30 LAB — HEPATITIS B SURFACE ANTIGEN: Hepatitis B Surface Ag: NEGATIVE

## 2017-08-30 LAB — HEPATITIS B CORE ANTIBODY, TOTAL: HEP B C TOTAL AB: NEGATIVE

## 2017-08-30 LAB — HEPATITIS C ANTIBODY: Hep C Virus Ab: >11 s/co ratio — ABNORMAL HIGH (ref 0.0–0.9)

## 2017-08-30 LAB — HEPATITIS B SURFACE ANTIBODY,QUALITATIVE: HEP B SURFACE AB, QUAL: NONREACTIVE

## 2017-08-30 MED ORDER — ACETAMINOPHEN 500 MG PO TABS: 500.0000 mg | ORAL_TABLET | Freq: Four times a day (QID) | ORAL | Status: DC | PRN

## 2017-08-30 MED ORDER — INFLUENZA VAC SPLIT QUAD 0.5 ML IM SUSY
0.5000 mL | PREFILLED_SYRINGE | INTRAMUSCULAR | Status: AC
  Administered 2017-09-01: 0.5 mL via INTRAMUSCULAR
  Filled 2017-08-30: qty 0.5

## 2017-08-30 MED ORDER — FOLIC ACID 1 MG PO TABS
1.0000 mg | ORAL_TABLET | Freq: Every day | ORAL | Status: DC
  Administered 2017-08-30 – 2017-09-02 (×4): 1 mg via ORAL
  Filled 2017-08-30 (×4): qty 1

## 2017-08-30 MED ORDER — TECHNETIUM TC 99M MEBROFENIN IV KIT
5.0000 | PACK | Freq: Once | INTRAVENOUS | Status: AC | PRN
Start: 2017-08-30 — End: 2017-08-30
  Administered 2017-08-30: 5 via INTRAVENOUS

## 2017-08-30 MED ORDER — SODIUM CHLORIDE 0.9 % IV SOLN
2.0000 g | INTRAVENOUS | Status: DC
  Filled 2017-08-30: qty 20

## 2017-08-30 MED ORDER — SODIUM CHLORIDE 0.9 % IV SOLN
INTRAVENOUS | Status: DC
  Administered 2017-08-30 (×2): via INTRAVENOUS

## 2017-08-30 MED ORDER — SODIUM CHLORIDE 0.9 % IV SOLN
INTRAVENOUS | Status: DC
  Administered 2017-08-31: 09:00:00 via INTRAVENOUS

## 2017-08-30 MED ORDER — BOOST / RESOURCE BREEZE PO LIQD CUSTOM
1.0000 | Freq: Two times a day (BID) | ORAL | Status: DC
  Administered 2017-08-30 – 2017-09-02 (×6): 1 via ORAL

## 2017-08-30 MED ORDER — MORPHINE SULFATE (PF) 4 MG/ML IV SOLN
2.0000 mg | INTRAVENOUS | Status: DC | PRN
  Administered 2017-08-30: 2 mg via INTRAVENOUS
  Filled 2017-08-30: qty 1

## 2017-08-30 MED ORDER — HYDROCHLOROTHIAZIDE 25 MG PO TABS: 12.5000 mg | ORAL_TABLET | Freq: Every day | ORAL | Status: DC

## 2017-08-30 MED ORDER — SODIUM CHLORIDE 0.9 % IV SOLN
1.0000 g | INTRAVENOUS | Status: DC
  Administered 2017-08-30: 1 g via INTRAVENOUS
  Filled 2017-08-30: qty 10

## 2017-08-30 MED ORDER — HEPARIN SODIUM (PORCINE) 5000 UNIT/ML IJ SOLN: 5000.0000 [IU] | Freq: Three times a day (TID) | INTRAMUSCULAR | Status: DC

## 2017-08-30 MED ORDER — IBUPROFEN 200 MG PO TABS: 600.0000 mg | ORAL_TABLET | Freq: Four times a day (QID) | ORAL | Status: DC | PRN

## 2017-08-30 MED ORDER — MORPHINE SULFATE (PF) 4 MG/ML IV SOLN: 2.0000 mg | INTRAVENOUS | Status: DC | PRN

## 2017-08-30 MED ORDER — ONDANSETRON HCL 4 MG/2ML IJ SOLN
4.0000 mg | Freq: Three times a day (TID) | INTRAMUSCULAR | Status: DC | PRN
  Administered 2017-08-30: 4 mg via INTRAVENOUS
  Filled 2017-08-30: qty 2

## 2017-08-30 MED ORDER — METRONIDAZOLE IN NACL 5-0.79 MG/ML-% IV SOLN
500.0000 mg | Freq: Three times a day (TID) | INTRAVENOUS | Status: DC
  Administered 2017-08-30: 500 mg via INTRAVENOUS
  Filled 2017-08-30 (×2): qty 100

## 2017-08-30 MED ORDER — LISINOPRIL 20 MG PO TABS: 20.0000 mg | ORAL_TABLET | Freq: Every day | ORAL | Status: DC

## 2017-08-30 MED ORDER — ACETAMINOPHEN 325 MG PO TABS: 650.0000 mg | ORAL_TABLET | Freq: Four times a day (QID) | ORAL | Status: DC | PRN

## 2017-08-30 MED ORDER — METRONIDAZOLE IN NACL 5-0.79 MG/ML-% IV SOLN: 500.0000 mg | Freq: Three times a day (TID) | INTRAVENOUS | Status: DC

## 2017-08-30 MED ORDER — ACETAMINOPHEN 500 MG PO TABS
500.0000 mg | ORAL_TABLET | Freq: Four times a day (QID) | ORAL | Status: AC | PRN
  Administered 2017-08-30 – 2017-09-01 (×3): 500 mg via ORAL
  Filled 2017-08-30 (×4): qty 1

## 2017-08-30 NOTE — Progress Notes (Signed)
  HIDA negative, patient does not need any surgical intervention. Recommend further workup of hepatocellular dysfunction. Please call if we can be of any further assistance.  Brooke A Meuth

## 2017-08-30 NOTE — Progress Notes (Signed)
Initial Nutrition Assessment  DOCUMENTATION CODES:   Obesity unspecified  INTERVENTION:   -Boost Breeze po BID, each supplement provides 250 kcal and 9 grams of protein  NUTRITION DIAGNOSIS:   Inadequate oral intake related to altered GI function as evidenced by per patient/family report.  GOAL:   Patient will meet greater than or equal to 90% of their needs  MONITOR:   PO intake, Supplement acceptance, Labs, Weight trends, Skin, I & O's  REASON FOR ASSESSMENT:   Malnutrition Screening Tool    ASSESSMENT:   52 yo F with ~1-2 wks of RUQ abdominal pain, fever, ta50chycardia, + Murphy's sign and US evidence supporting cholecystitis. Although the differential includes choledocholithiasis vs. hepatitis, pancreatitis, as well as cholangitis, given her evaluation thus far, I believe she the primary issue to be cholecystitis.  Pt admitted with acute cholecystitis. Plan for HIDA scan today.   Spoke with pt and husband at bedside. Pt reports a general decline in health over the past month, which started when she was hospitalized with cellulitis. Pt shares that she has not been able to eat as she usually does related to stomach pain. She shares she has been consuming 2 meals per day of mainly easy to digest foods, such as soup and crackers or grilled chicken salad. She is very eager to eat.  She reports her usual weight is around 200#. She suspects most of her weight loss occurred in the past month due to acute illness. Noted pt has experienced a 5.3% wt loss within the past month, which is significant for time frame, however, suspect some degree of wt loss may be related to dehydration.   Discussed importance of good meal and supplement intake to promote healing. Pt amenable to supplements once diet was advanced (noted pt was advanced to a regular diet after RD visit). Will trial Boost Breeze supplement due to low fat content.   Labs reviewed: Na: 133.  NUTRITION - FOCUSED PHYSICAL  EXAM:    Most Recent Value  Orbital Region  No depletion  Upper Arm Region  No depletion  Thoracic and Lumbar Region  No depletion  Buccal Region  No depletion  Temple Region  No depletion  Clavicle Bone Region  No depletion  Clavicle and Acromion Bone Region  No depletion  Scapular Bone Region  No depletion  Dorsal Hand  No depletion  Patellar Region  No depletion  Anterior Thigh Region  No depletion  Posterior Calf Region  No depletion  Edema (RD Assessment)  None  Hair  Reviewed  Eyes  Reviewed  Mouth  Reviewed  Skin  Reviewed  Nails  Reviewed       Diet Order:  Diet regular Room service appropriate? Yes; Fluid consistency: Thin  EDUCATION NEEDS:   Education needs have been addressed  Skin:  Skin Assessment: Reviewed RN Assessment  Last BM:  08/29/17  Height:   Ht Readings from Last 1 Encounters:  08/30/17 5\' 4"  (1.626 m)    Weight:   Wt Readings from Last 1 Encounters:  08/30/17 178 lb 12.7 oz (81.1 kg)    Ideal Body Weight:  54.4 kg  BMI:  Body mass index is 30.69 kg/m.  Estimated Nutritional Needs:   Kcal:  1650-1850  Protein:  70-85 grams  Fluid:  1.6-1.8 L    Lindell Renfrew A. Mayford KnifeWilliams, RD, LDN, CDE Pager: [phone] After hours Pager: [phone]

## 2017-08-30 NOTE — H&P (Signed)
Date: 08/30/2017               Patient Name:  Mary Anderson MRN: [id number]  DOB: [birth year] Age / Sex: 52 y.o., female   PCP: Burna Cash, MD         Medical Service: Internal Medicine Teaching Service         Attending Physician: Dr. Levert Feinstein, MD    First Contact: Dr. Minda Meo Pager: [phone]  Second Contact: Dr. Vincente Liberty Pager: [phone]       After Hours (After 5p/  First Contact Pager: [phone]  weekends / holidays): Second Contact Pager: [phone]   Chief Complaint: RUQ abdominal pain  History of Present Illness: Mary Anderson is a 52 yo F with previously elevated transaminases of known etiology and HTN who presents with progressively worsening RUQ abdominal pain for 1-2 wks. This is not improved with medication or other as she has not attempted self treatment. It is not made worse with PO intake but is worsened by lying on her right side. She has associated decreased appetite, diarrhea and dysuria. She denied fever, chills, nausea, vomiting, myalgias, cough, headaches, visual changes, or chest pain. She had presented to the IMTS on 08/29/2017 for evaluation of her elevated transaminases. At that visit it was noted that she was febrile, with associated tachycardia prompting confirmation of SIRS criteria, as such she was sent to the ED for rapid evaluation as her presentation was concerning for cholecystitis vs. Choledocholithiasis vs. Hepatitis, pancreatitis, and cholangitis potentially.   ED Course: [place] RUQ revealed cholelithiasis, with gallbladder wall thickening concerning for acute cholecystitis. There was no associated leukocytosis, although she was noted to be febrile. CBC indicated a stable at 12.2 Hgb with MCV chronically elevated >101 and a platelet count 79K. UA revealed moderate leuks, but no bacteria or nitrites although there was moderate Hgb w/o RBC's which was not concerning for UTI. CMP was concerning for hyponatremia as well as an ARI with a Cr of 1.44 up form 0.63  a month prior. Lipase was normal. CXR was unremarkable. Surgery was consulted for the cholelithiasis and probable cholecystitis, they agreed to see evaluate the patient following admission with recommendations left.     Meds:  Current Meds  Medication Sig  . hydrochlorothiazide (HYDRODIURIL) 12.5 MG tablet Take 1 tablet (12.5 mg total) by mouth daily.  Marland Kitchen ibuprofen (ADVIL,MOTRIN) 200 MG tablet Take 200-400 mg by mouth every 6 (six) hours as needed for mild pain.   Marland Kitchen lisinopril (PRINIVIL,ZESTRIL) 20 MG tablet Take 1 tablet (20 mg total) by mouth daily.   Allergies: Allergies as of 08/29/2017  . (No Known Allergies)   Past Medical History:  Diagnosis Date  . Hypertension    Family History:  Mother-colon Ca, deceased. Also with cholecystectomy and HTN Brother with HTN  Social History:  Denied illicit drugs or tobacco Attested to 2-3 glasses of wine every other day on average   Review of Systems: A complete ROS was negative except as per HPI.   Physical Exam: Blood pressure 129/66, pulse 85, temperature 98.5 F (36.9 C), temperature source Oral, resp. rate 18, last menstrual period 04/22/2011, SpO2 99 %. Physical Exam  Constitutional: She is oriented to person, place, and time. She appears well-developed and well-nourished. No distress.  HENT:  Head: Normocephalic and atraumatic.  Eyes: Conjunctivae and EOM are normal. Pupils are equal, round, and reactive to light. Scleral icterus is present.  Neck: Normal range of motion. Neck supple.  Cardiovascular: Normal rate  and regular rhythm.  No murmur heard. Pulmonary/Chest: Effort normal and breath sounds normal. No stridor. No respiratory distress.  Abdominal: Soft. Bowel sounds are normal. She exhibits no distension. There is no hepatomegaly. There is tenderness in the right upper quadrant. There is guarding and positive Murphy's sign. There is no rigidity and no rebound.  Musculoskeletal: She exhibits no edema or tenderness.    Neurological: She is alert and oriented to person, place, and time.  Skin: Skin is warm. Capillary refill takes less than 2 seconds. She is not diaphoretic.  Jaundice  Psychiatric: She has a normal mood and affect.  Nursing note and vitals reviewed.  EKG: personally reviewed my interpretation is n/a  CXR: personally reviewed my interpretation is no acute cardiopulmonary process. No pleural effusion, focal consolidation or interstitial edema noted.   Assessment & Plan by Problem: Active Problems:   Acute cholecystitis  Assessment: 52 yo F with ~1-2 wks of RUQ abdominal pain, fever, tachycardia, + Murphy's sign and US evidence supporting cholecystitis. Although the differential includes choledocholithiasis vs. hepatitis, pancreatitis, as well as cholangitis, given her evaluation thus far, I believe she the primary issue to be cholecystitis.   RUQ abdominal pain, Cholecystitis: Patient with RUQ abdominal pain, cholelithiasis and evidence of gallbladder wall thickening as well as supporting physical exam with positive Murphy's sign and signs of inflammation with a fever.  -Surgery consulted, will appreciate their input -NPO with sips w/ meds and ice chips -IV fluids -Pain control with morphine 2mg  IV q4 hour PRN for severe pain -Continue to follow hepatic evaluation -Continue Ceftriaxone 2 G Q24 hours -Continue metronidazole IV 500mg  Q 8 hours -Holding NSAIDS due to renal injury -Zofran for nausea -P-time/INR in am in prep for potential surgery -Trend fever, WBCs, vitals for sepsis watch   HTN: Patient mildly hypotensive during her admission again most likely from acute decreased PO intake. Holding home antihypertensives. Will restart as her pressure improves.   Elevated liver enzymes: Initially presented to the clinic today for evaluation of her elevated liver enzymes.  -Hep panel ordered, B & C  -CMP in am -May need GI consult to evaluate etiology   Acute renal injury: Serum  Cr increased to 1.07 and on repeat to 1.44 from 0.63 1 month prior. This is most likely due to dehydration from poor appetite and decreased PO intake although other causes can not be ruled out at this time.  -Continue fluids -Repeat CMP this am -Will adjust fluids as indicated and continue to monitor if progress fails to occur.  Diet: NPO Code: Full Fluids: 16625ml/hr normal saline VTE ppx: SCD's Dispo: Admit patient to Inpatient with expected length of stay greater than 2 midnights.  Signed: Lanelle BalHarbrecht, Pete Merten, MD 08/30/2017, 2:39 AM  Pager: Pager# [phone]

## 2017-08-30 NOTE — Progress Notes (Signed)
  Echocardiogram 2D Echocardiogram has been performed.  Mary Anderson  Mary Anderson 08/30/2017, 3:09 PM

## 2017-08-30 NOTE — Consult Note (Signed)
Reason for Consult:epigastric pain, gallstones Referring Physician: Dr Leonides Schanz  Mary Anderson is an 52 y.o. female.  HPI: 52 year old Caucasian female with a history of hypertension and lower extremity cellulitis presented to internal medicine teaching service yesterday with complaints of pain in her epigastrium mainly with coughing.  She states that she has had a productive cough for about 2 weeks and had pain in her upper abdomen lower chest area.  She denies any fevers or chills.  But apparently she had a fever in the clinic and was mildly tachycardic and was felt to have a Sirs picture and was sent to the emergency room.  She states that she has been eating and drinking normally.  She reports a normal appetite.  She does endorse some dysuria, frequency and urgency and a discolored urine.  She states that she is not been drinking for about 4-5 months since she was told her LFTs were elevated.  The discomfort does not radiate to her back or shoulders. Nothing exacerbates her discomfort other than coughing  She has had persistently elevated LFTs mainly bilirubin and transaminases since August 2018.  She is also found to have thrombocytopenia of unknown etiology.  She states that she would drink 3-4 glasses of wine per week  Ultrasound showed gallstones and minimal wall thickening.  No pericholecystic fluid.  There is also a nodular appearance to the liver suggestive of cirrhosis.  On a remote CT scan from several years ago there is evidence of splenomegaly  Past Medical History:  Diagnosis Date  . Hypertension     History reviewed. No pertinent surgical history.  Family History  Problem Relation Age of Onset  . Hypertension Mother     Social History:  reports that  has never smoked. she has never used smokeless tobacco. She reports that she drinks alcohol. She reports that she does not use drugs.  Allergies: No Known Allergies  Medications: I have reviewed the patient's current  medications.  Results for orders placed or performed during the hospital encounter of 08/29/17 (from the past 48 hour(s))  I-Stat beta hCG blood, ED     Status: None   Collection Time: 08/29/17  6:04 PM  Result Value Ref Range   I-stat hCG, quantitative <5.0 <5 mIU/mL   Comment 3            Comment:   GEST. AGE      CONC.  (mIU/mL)   <=1 WEEK        5 - 50     2 WEEKS       50 - 500     3 WEEKS       100 - 10,000     4 WEEKS     1,000 - 30,000        FEMALE AND NON-PREGNANT FEMALE:     LESS THAN 5 mIU/mL   I-Stat CG4 Lactic Acid, ED     Status: None   Collection Time: 08/29/17  6:06 PM  Result Value Ref Range   Lactic Acid, Venous 0.94 0.5 - 1.9 mmol/L  Urinalysis, Routine w reflex microscopic     Status: Abnormal   Collection Time: 08/29/17 11:00 PM  Result Value Ref Range   Color, Urine AMBER (A) YELLOW    Comment: BIOCHEMICALS MAY BE AFFECTED BY COLOR   APPearance TURBID (A) CLEAR   Specific Gravity, Urine 1.017 1.005 - 1.030   pH 5.0 5.0 - 8.0   Glucose, UA 50 (A) NEGATIVE mg/dL  Hgb urine dipstick MODERATE (A) NEGATIVE   Bilirubin Urine NEGATIVE NEGATIVE   Ketones, ur NEGATIVE NEGATIVE mg/dL   Protein, ur 100 (A) NEGATIVE mg/dL   Nitrite NEGATIVE NEGATIVE   Leukocytes, UA MODERATE (A) NEGATIVE   RBC / HPF 6-30 0 - 5 RBC/hpf   WBC, UA 6-30 0 - 5 WBC/hpf   Bacteria, UA NONE SEEN NONE SEEN   Squamous Epithelial / LPF 0-5 (A) NONE SEEN   WBC Clumps PRESENT    Mucus PRESENT     Comment: Performed at [facility], [place] [address], [place], [place] [address]  CBC with Differential     Status: Abnormal   Collection Time: 08/29/17 11:42 PM  Result Value Ref Range   WBC 8.7 4.0 - 10.5 K/uL   RBC 3.51 (L) 3.87 - 5.11 MIL/uL   Hemoglobin 12.2 12.0 - 15.0 g/dL   HCT 35.5 (L) 36.0 - 46.0 %   MCV 101.1 (H) 78.0 - 100.0 fL   MCH 34.8 (H) 26.0 - 34.0 pg   MCHC 34.4 30.0 - 36.0 g/dL   RDW 14.3 11.5 - 15.5 %   Platelets 79 (L) 150 - 400 K/uL    Comment: CONSISTENT WITH  PREVIOUS RESULT   Neutrophils Relative % 61 %   Neutro Abs 5.4 1.7 - 7.7 K/uL   Lymphocytes Relative 14 %   Lymphs Abs 1.2 0.7 - 4.0 K/uL   Monocytes Relative 24 %   Monocytes Absolute 2.1 (H) 0.1 - 1.0 K/uL   Eosinophils Relative 1 %   Eosinophils Absolute 0.1 0.0 - 0.7 K/uL   Basophils Relative 0 %   Basophils Absolute 0.0 0.0 - 0.1 K/uL    Comment: Performed at [facility], [place] [address] [place], [place] [address]  Comprehensive metabolic panel     Status: Abnormal   Collection Time: 08/29/17 11:42 PM  Result Value Ref Range   Sodium 131 (L) 135 - 145 mmol/L   Potassium 3.8 3.5 - 5.1 mmol/L   Chloride 100 (L) 101 - 111 mmol/L   CO2 22 22 - 32 mmol/L   Glucose, Bld 124 (H) 65 - 99 mg/dL   BUN 20 6 - 20 mg/dL   Creatinine, Ser 1.44 (H) 0.44 - 1.00 mg/dL   Calcium 8.0 (L) 8.9 - 10.3 mg/dL   Total Protein 6.8 6.5 - 8.1 g/dL   Albumin 2.8 (L) 3.5 - 5.0 g/dL   AST 111 (H) 15 - 41 U/L   ALT 100 (H) 14 - 54 U/L   Alkaline Phosphatase 84 38 - 126 U/L   Total Bilirubin 2.7 (H) 0.3 - 1.2 mg/dL   GFR calc non Af Amer 41 (L) >60 mL/min   GFR calc Af Amer 47 (L) >60 mL/min    Comment: (NOTE) The eGFR has been calculated using the CKD EPI equation. This calculation has not been validated in all clinical situations. eGFR's persistently <60 mL/min signify possible Chronic Kidney Disease.    Anion gap 9 5 - 15    Comment: Performed at [facility] [address] [place], [place] [address]  Lipase, blood     Status: None   Collection Time: 08/29/17 11:42 PM  Result Value Ref Range   Lipase 44 11 - 51 U/L    Comment: Performed at [facility] [address], [place], [place] [address]  CBC     Status: Abnormal   Collection Time: 08/30/17  2:16 AM  Result Value  Ref Range   WBC 9.4 4.0 - 10.5 K/uL   RBC 3.47 (L) 3.87 - 5.11 MIL/uL   Hemoglobin 12.0 12.0 - 15.0 g/dL   HCT 35.2 (L) 36.0 - 46.0 %   MCV 101.4 (H) 78.0 - 100.0 fL   MCH 34.6 (H) 26.0 - 34.0 pg    MCHC 34.1 30.0 - 36.0 g/dL   RDW 14.3 11.5 - 15.5 %   Platelets 77 (L) 150 - 400 K/uL    Comment: CONSISTENT WITH PREVIOUS RESULT Performed at [facility] [address], [place], [place] [address]   Comprehensive metabolic panel     Status: Abnormal   Collection Time: 08/30/17  2:16 AM  Result Value Ref Range   Sodium 133 (L) 135 - 145 mmol/L   Potassium 4.1 3.5 - 5.1 mmol/L   Chloride 101 101 - 111 mmol/L   CO2 22 22 - 32 mmol/L   Glucose, Bld 136 (H) 65 - 99 mg/dL   BUN 22 (H) 6 - 20 mg/dL   Creatinine, Ser 1.48 (H) 0.44 - 1.00 mg/dL   Calcium 7.8 (L) 8.9 - 10.3 mg/dL   Total Protein 6.4 (L) 6.5 - 8.1 g/dL   Albumin 2.7 (L) 3.5 - 5.0 g/dL   AST 104 (H) 15 - 41 U/L   ALT 94 (H) 14 - 54 U/L   Alkaline Phosphatase 82 38 - 126 U/L   Total Bilirubin 2.6 (H) 0.3 - 1.2 mg/dL   GFR calc non Af Amer 40 (L) >60 mL/min   GFR calc Af Amer 46 (L) >60 mL/min    Comment: (NOTE) The eGFR has been calculated using the CKD EPI equation. This calculation has not been validated in all clinical situations. eGFR's persistently <60 mL/min signify possible Chronic Kidney Disease.    Anion gap 10 5 - 15    Comment: Performed at [facility] [address], [place], [place] [address]  Protime-INR     Status: Abnormal   Collection Time: 08/30/17  2:16 AM  Result Value Ref Range   Prothrombin Time 15.6 (H) 11.4 - 15.2 seconds   INR 1.25     Comment: Performed at [facility] [address] [place], [place] [address]    Dg Chest 2 View  Result Date: 08/29/2017 CLINICAL DATA:  Productive cough. EXAM: CHEST  2 VIEW COMPARISON:  12/29/2006. FINDINGS: The lungs are clear without focal pneumonia, edema, pneumothorax or pleural effusion. The cardiopericardial silhouette is within normal limits for size. The visualized bony structures of the thorax are intact. IMPRESSION: No active cardiopulmonary disease. Electronically Signed   By: Misty Stanley M.D.   On: 08/29/2017 18:48   US  Abdomen Limited Ruq  Result Date: 08/29/2017 CLINICAL DATA:  Right upper quadrant pain EXAM: ULTRASOUND ABDOMEN LIMITED RIGHT UPPER QUADRANT COMPARISON:  None. FINDINGS: Gallbladder: Multiple layering gallstones, the largest measuring 1.2 cm. Mild gallbladder wall thickening measuring up to 6 mm. The patient was tender over the gallbladder during the study. Common bile duct: Diameter: The normal caliber, 3 mm Liver: Heterogeneous echotexture. Subtle nodularity to the liver surface raises the possibility of early cirrhosis. No focal abnormality. Portal vein is patent on color Doppler imaging with normal direction of blood flow towards the liver. IMPRESSION: Heterogeneous echotexture within the liver with subtle nodularity to the contours suggesting early cirrhosis. Recommend clinical correlation. Cholelithiasis. There is gallbladder wall thickening. The patient was tender over the gallbladder during the study. Findings are concerning for acute  cholecystitis. Electronically Signed   By: Rolm Baptise M.D.   On: 08/29/2017 19:12    Review of Systems  Constitutional: Negative for chills, diaphoresis and weight loss.  HENT: Negative for nosebleeds.   Eyes: Negative for blurred vision.  Respiratory: Positive for cough and sputum production. Negative for shortness of breath.   Cardiovascular: Negative for chest pain, palpitations, orthopnea and PND.       Denies DOE  Gastrointestinal: Negative for diarrhea, heartburn, nausea and vomiting.  Genitourinary: Positive for dysuria, frequency and urgency. Negative for hematuria.  Musculoskeletal: Negative.   Skin: Negative for itching and rash.  Neurological: Negative for dizziness, focal weakness, seizures, loss of consciousness, weakness and headaches.       Denies TIAs, amaurosis fugax  Endo/Heme/Allergies: Does not bruise/bleed easily.  Psychiatric/Behavioral: The patient is not nervous/anxious.    Blood pressure (!) 130/59, pulse (!) 101, temperature 99.5  F (37.5 C), temperature source Oral, resp. rate 17, height _0  (1.626 m), weight 81.1 kg (178 lb 12.7 oz), last menstrual period 04/22/2011, SpO2 99 %. Physical Exam  Vitals reviewed. Constitutional: She is oriented to person, place, and time. She appears well-developed and well-nourished. No distress.  HENT:  Head: Normocephalic and atraumatic.  Right Ear: External ear normal.  Left Ear: External ear normal.  Eyes: Conjunctivae are normal. Scleral icterus is present.  Neck: Normal range of motion. Neck supple. No tracheal deviation present. No thyromegaly present.  Cardiovascular: Normal rate and regular rhythm.  Murmur heard.  Systolic murmur is present with a grade of 3/6. Respiratory: Effort normal and breath sounds normal. No stridor. No respiratory distress. She has no wheezes.  GI: Soft. She exhibits no distension. There is no rigidity, no rebound and no guarding.  Mild RUQ/epigastric TTP  Musculoskeletal: She exhibits no edema or tenderness.  Lymphadenopathy:    She has no cervical adenopathy.  Neurological: She is alert and oriented to person, place, and time. She exhibits normal muscle tone.  Skin: Skin is warm and dry. No rash noted. She is not diaphoretic. No erythema. No pallor.  Psychiatric: She has a normal mood and affect. Her behavior is normal. Judgment and thought content normal.    Assessment/Plan: Gallstones Elevated Tbili, transaminases Thrombocytopenia Nodularity to liver surface on ultrasound imaging suggestive of cirrhosis Radiological evidence of splenomegaly on prior CT Acute kidney injury Dysuria Hypoalbuminemia Cardiac murmur  While she does have some tenderness on exam her clinical history is not consistent with cholecystitis.  Moreover her chronically elevated transaminases and T bilirubin, splenomegaly, nodular appearance of her liver, thrombocytopenia is more concerning for cirrhosis  Check nuclear medicine scan of gallbladder Continue  empiric antibiotics Follow-up hepatitis panel May need gastroenterology consult Management of acute kidney injury per primary team Our day team will follow up on HIDA results NPO xcept meds for HIDA scan  Leighton Ruff. Redmond Pulling, MD, FACS General, Bariatric, & Minimally Invasive Surgery [facility]   Greer Pickerel 08/30/2017, 7:12 AM

## 2017-08-30 NOTE — Progress Notes (Signed)
Internal Medicine Clinic Attending  I saw and evaluated the patient.  I personally confirmed the key portions of the history and exam documented by Dr. Obie DredgeBlum and I reviewed pertinent patient test results.  The assessment, diagnosis, and plan were formulated together and I agree with the documentation in the resident's note.  The patient presents with a classic triad of fever, subtle jaundice, and right upper quadrant pain. She also has a positive Murphy sign on exam. I think the diagnosis here is acute cholecystitis. Because it was the end of the afternoon, we referred her to the ED to await confirmatory ultrasound and then admission to the teaching service for antibiotics and operative management.

## 2017-08-30 NOTE — Progress Notes (Signed)
Pharmacy Antibiotic Note  Mary Anderson is a 52 y.o. female admitted on 08/29/2017 with acute cholecystitis .  Pharmacy has been consulted for Ceftriaxone dosing. WBC WNL. Surgery to see.   Plan: Ceftriaxone 2g IV q24h Trend WBC, temp F/U surgery plans  Temp (24hrs), Avg:100 F (37.8 C), Min:98.3 F (36.8 C), Max:102.9 F (39.4 C)  Recent Labs  Lab 08/29/17 1640 08/29/17 1806 08/29/17 2342  WBC 7.3  --  8.7  CREATININE 1.07*  --  1.44*  LATICACIDVEN  --  0.94  --     Estimated Creatinine Clearance: 47.1 mL/min (A) (by C-G formula based on SCr of 1.44 mg/dL (H)).    No Known Allergies  Mary Anderson, Mary Anderson 08/30/2017 2:19 AM

## 2017-08-30 NOTE — Progress Notes (Addendum)
IMTS NIGHT FLOAT INTERVAL PROGRESS NOTE  Paged by RN at ~10:15pm that patient had fever to 103.1 and was complaining of pain. Went to bedside to evaluate patient. Upon entering the room, patient was sleeping comfortably in bed. Partner at bedside. She endorsed subjective fevers and epigastric abdominal pain that did not radiate to her back. She denied nausea or emesis, as well as right upper quadrant abdominal pain. She denied urinary symptoms and stated that her dysuria and cloudy urine had resolved since being in the hospital.  Exam GEN: Resting in bed in NAD CV: Tachycardic, regular rhythm. Systolic murmur, best heard at RUSB. PULM: CTAB, no wheezes ABD: Soft, tender to palpation in epigastric area, non-tender in other quadrants, non-distended, normal bowel sounds SKIN: Warm to the touch  Assessment/Plan Will continue to monitor temperature, possibly residual fever from UTI that has now been treated. No evidence of acute cholecystitis on RUQ U/S done earlier today. We are limited in our anti-pyretics given AKI and transaminitis, however daily dose of <2g of acetaminophen is acceptable. With epigastric pain and chronic alcohol use, could also consider PUD vs chronic pancreatitis vs alcoholic gastropathy on the differential. - Continue ceftriaxone - Monitor fever curve - BCx ordered on admission pending - Acetaminophen 500mg  q6h PRN for fever x3 doses - IV morphine 2mg  q4h PRN for severe pain - Consider CT abd/pelvis, however would need to be cautious given AKI  Scherrie GerlachJennifer Kelvin Burpee, MD Internal Medicine, PGY-1

## 2017-08-30 NOTE — Progress Notes (Signed)
   Subjective:  Patient states she has RUQ pain for the past week or so, not worsened by food. Denies N/V/D. Endorses dysuria, urgency, and sensation of incomplete voiding. Patient has a history of ETOH use. States she quit ETOH use about 3-4 months ago. Prior to that she states she would drink 3-4 glasses of wine about 3 days of the week.   Objective:  Vital signs in last 24 hours: Vitals:   08/30/17 0130 08/30/17 0145 08/30/17 0236 08/30/17 0340  BP: 137/74 129/69 129/66 (!) 130/59  Pulse: 83 82 85 (!) 101  Resp:    17  Temp:    99.5 F (37.5 C)  TempSrc:    Oral  SpO2: 100% 99%  99%  Weight:    178 lb 12.7 oz (81.1 kg)  Height:    5\' 4"  (1.626 m)   General: Laying in bed comfortably, NAD HEENT: [place]/AT, EOMI, no scleral icterus, PERRL Cardiac: RRR, systolic murmur present Pulm: normal effort, CTAB Abd: soft, non tender, non distended, BS normal Ext: extremities well perfused, no peripheral edema Neuro: alert and oriented X3, cranial nerves II-XII grossly intact   Assessment/Plan:  Active Problems:   Acute cholecystitis  RUQ abdominal pain, Elevated LFTs: [facility] with RUQ abdominal pain, cholelithiasis and evidence of gallbladder wall thickening, as well as supporting physical exam with positive Murphy's sign. Surgery consulted and started the patient on abx and ordered a HIDA scan. HIDA scan performed which showed no evidence of acute cholecystitis. HIDA scan did show delayed blood pool clearance of tracer and mild parenchymal tracer retention within the liver suggesting hepatocellular dysfunction. Patient has a history of elevated LFTs. AST 104, ALT 94, and T bili 2.6.  Acute hepatitis panel still pending.  -Surgery consulted, appreciate recs -IV fluids 125 cc/hr -Continue Ceftriaxone 2 G Q24 hours -Continue metronidazole IV 500mg  Q 8 hours -Pain control with morphine 2mg  IV q4 hour PRN for severe pain -Zofran for nausea -Hep panel ordered, B & C  - pending will follow up -Trend fever, WBCs, vitals  -Blood cultures obtained and pending   HTN: Patient mildly hypotensive during her admission again most likely from acute decreased PO intake. Patient remains normotensive.  -Holding home antihypertensives.  -Will restart as her pressure improves.   AKI Serum Cr 1.48 from baseline of ~0.6 1 month prior. This is most likely due to dehydration from poor appetite and decreased PO intake although other causes can not be ruled out at this time.  -Continue fluids 125 cc/hr NS -Repeat CMP in am  UTI, dysuria -UA dirty -Continue Ceftriaxone   Heart Murmur -Obtaining ECHO - will f/u results  Dispo: Anticipated discharge in approximately 1-2 day(s).   Toney RakesLacroce, Treavon Castilleja J, MD 08/30/2017, 1:35 PM Pager: [phone]

## 2017-08-30 NOTE — Progress Notes (Addendum)
Case discussed with Dr Elesa MassedWard Chart reviewed Will see later this am in consultation  Has elevated Tbili, ast/alt since 01/2017 No wbc Now worsening thrombocytopenia Elevated Cr Fever U/s shows GS, MILD wall thickening, possible cirrhosis Very remote CT showed splenomegaly  Given plts, liver appearance on u/s, splenomegaly, several mo h/o elevated LFT - question pt's reported alcohol use  rec admit to teaching service Start empiric abx for GB coverage NPO x meds/ice chips for now Agree with checking hepatitis panel Follow liver/kidney function Check coags in AM May need HIDA   Mary SellaEric M. Andrey CampanileWilson, MD, FACS FASMBS General, Bariatric, & Minimally Invasive Surgery [facility], [place]

## 2017-08-31 DIAGNOSIS — R509 Fever, unspecified: Secondary | ICD-10-CM

## 2017-08-31 DIAGNOSIS — E860 Dehydration: Secondary | ICD-10-CM

## 2017-08-31 LAB — HEPATITIS PANEL, ACUTE
HCV Ab: >11 s/co ratio — ABNORMAL HIGH (ref 0.0–0.9)
Hep A IgM: NEGATIVE
Hep B C IgM: NEGATIVE
Hepatitis B Surface Ag: NEGATIVE

## 2017-08-31 LAB — COMPREHENSIVE METABOLIC PANEL
ALT: 65 U/L — AB (ref 14–54)
AST: 73 U/L — AB (ref 15–41)
Albumin: 2 g/dL — ABNORMAL LOW (ref 3.5–5.0)
Alkaline Phosphatase: 69 U/L (ref 38–126)
Anion gap: 5 (ref 5–15)
BILIRUBIN TOTAL: 1.9 mg/dL — AB (ref 0.3–1.2)
BUN: 18 mg/dL (ref 6–20)
CO2: 23 mmol/L (ref 22–32)
CREATININE: 1.05 mg/dL — AB (ref 0.44–1.00)
Calcium: 7.1 mg/dL — ABNORMAL LOW (ref 8.9–10.3)
Chloride: 106 mmol/L (ref 101–111)
GFR calc Af Amer: >60 mL/min (ref >60)
GFR, EST NON AFRICAN AMERICAN: 60 mL/min — AB (ref >60)
GLUCOSE: 135 mg/dL — AB (ref 65–99)
Potassium: 3.6 mmol/L (ref 3.5–5.1)
Sodium: 134 mmol/L — ABNORMAL LOW (ref 135–145)
Total Protein: 5.2 g/dL — ABNORMAL LOW (ref 6.5–8.1)

## 2017-08-31 LAB — CBC
HEMATOCRIT: 29.2 % — AB (ref 36.0–46.0)
Hemoglobin: 10 g/dL — ABNORMAL LOW (ref 12.0–15.0)
MCH: 34.4 pg — ABNORMAL HIGH (ref 26.0–34.0)
MCHC: 34.2 g/dL (ref 30.0–36.0)
MCV: 100.3 fL — AB (ref 78.0–100.0)
PLATELETS: 63 10*3/uL — AB (ref 150–400)
RBC: 2.91 MIL/uL — AB (ref 3.87–5.11)
RDW: 14.5 % (ref 11.5–15.5)
WBC: 7.3 10*3/uL (ref 4.0–10.5)

## 2017-08-31 LAB — VITAMIN D 25 HYDROXY (VIT D DEFICIENCY, FRACTURES): Vit D, 25-Hydroxy: 13.4 ng/mL — ABNORMAL LOW (ref 30.0–100.0)

## 2017-08-31 LAB — URINE CULTURE

## 2017-08-31 MED ORDER — SODIUM CHLORIDE 0.9 % IV SOLN
INTRAVENOUS | Status: AC
  Administered 2017-08-31: 22:00:00 via INTRAVENOUS

## 2017-08-31 MED ORDER — SODIUM CHLORIDE 0.9 % IV SOLN
3.0000 g | Freq: Four times a day (QID) | INTRAVENOUS | Status: DC
  Administered 2017-08-31 – 2017-09-02 (×8): 3 g via INTRAVENOUS
  Filled 2017-08-31 (×10): qty 3

## 2017-08-31 NOTE — Progress Notes (Signed)
Will reschedule Flu vaccine due today because of a febrile episode last night.

## 2017-08-31 NOTE — Progress Notes (Signed)
   Subjective:  Patient febrile overnight. States she is not doing well this AM. Had fever overnight and had sweats after receiving tylenol. Denies acute changes in abdominal pain. States she had recurrence of epigastric abdominal pain, which has been occurring for approximately 2 weeks duration. Denies N/V/D. Tolerating food. Denies dysuria or difficulty voiding.  Objective:  Vital signs in last 24 hours: Vitals:   08/30/17 1409 08/30/17 2116 08/31/17 0003 08/31/17 0507  BP: 131/71 (!) 126/59  125/71  Pulse: (!) 108 (!) 106  78  Resp:  16  16  Temp: 100.3 F (37.9 C) (!) 103.1 F (39.5 C) 99.5 F (37.5 C) 98.3 F (36.8 C)  TempSrc: Axillary Oral Oral Oral  SpO2: 97% 98%  100%  Weight:      Height:       General: Laying in bed comfortably, NAD HEENT: [place]/AT, no scleral icterus Cardiac: RRR, systolic murmur present Pulm: normal effort, CTAB Abd: soft, non tender, non distended, BS normal Ext: extremities well perfused, no peripheral edema Neuro: alert and oriented X3, cranial nerves II-XII grossly intact   Assessment/Plan:  Principal Problem:   Transaminitis Active Problems:   Hepatitis C antibody test positive  Hepatobiliary dysfunction LFTs improved today: AST 73, ALT 65, T bili 1.9. Acute hepatitis panel returned positive for Hep C ab.  RUQ ultrasound revealed heterogeneous echotexture within the liver with subtle nodularity to the contours suggesting early cirrhosis. Will follow up with CT abdomen pelvis, but will wait until creatinine improves. -Continue IV fluids 125 cc/hr -HCV RNA viral load pending -Pain control with morphine 2mg  IV q4 hour PRN for severe pain -Zofran for nausea -CT abdomen pelvis likely tomorrow if creatinine at baseline  Recurrent Fevers Patient febrile to 103 last night. Likely due to UTI but others causes such as bacteremia cannot be ruled out at this time. Blood cultures still pending. Patient remains hemodynamically stable, without  significant leukocytosis. ECHO with no signs of valvular vegetations.  -f/u blood cultures -tylenol PRN -Start Unasyn 3 grams q6 hours  HTN Patient remains normotensive.  -Will continue to hold home antihypertensives.  -will continue to monitor  AKI Serum Cr 1.05, trending toward patient's baseline. Likely 2/2 pre-renal to dehydration as her creatinine has responded to IVF.  -Continue fluids 125 cc/hr NS -Repeat CMP in am  UTI, dysuria Urine cx grew E.Coli. Susceptibilities pending. Received 2x ceftriaxone.  -Start Unasyn in setting of recurrent fevers -f/u susceptibilities   Heart Murmur ECHO EF 60-65%, no valvular abnormalities.    Dispo: Anticipated discharge in approximately 1-2 day(s).   Toney RakesLacroce, Samantha J, MD 08/31/2017, 6:24 AM Pager: [phone]

## 2017-08-31 NOTE — Progress Notes (Signed)
Pharmacy Antibiotic Note  Mary Anderson is a 52 y.o. female admitted on 08/29/2017 with acute cholecystitis .  Pharmacy has been consulted for Unasyn dosing.   Covering for E. coli UTI. Sensitive to Unasyn.  Plan: Start Unasyn 3g IV Q6h Monitor clinical picture, renal function F/U C&S, abx deescalation / LOT   Temp (24hrs), Avg:100.3 F (37.9 C), Min:98.3 F (36.8 C), Max:103.1 F (39.5 C)  Recent Labs  Lab 08/29/17 1640 08/29/17 1806 08/29/17 2342 08/30/17 0216 08/31/17 0344  WBC 7.3  --  8.7 9.4 7.3  CREATININE 1.07*  --  1.44* 1.48* 1.05*  LATICACIDVEN  --  0.94  --   --   --     Estimated Creatinine Clearance: 64.6 mL/min (A) (by C-G formula based on SCr of 1.05 mg/dL (H)).    No Known Allergies  Armandina StammerBATCHELDER,Daksha Koone J 08/31/2017 12:11 PM

## 2017-09-01 ENCOUNTER — Inpatient Hospital Stay (HOSPITAL_COMMUNITY): Payer: Self-pay

## 2017-09-01 ENCOUNTER — Encounter (HOSPITAL_COMMUNITY): Payer: Self-pay | Admitting: Radiology

## 2017-09-01 DIAGNOSIS — N12 Tubulo-interstitial nephritis, not specified as acute or chronic: Secondary | ICD-10-CM

## 2017-09-01 DIAGNOSIS — K7460 Unspecified cirrhosis of liver: Secondary | ICD-10-CM

## 2017-09-01 DIAGNOSIS — R197 Diarrhea, unspecified: Secondary | ICD-10-CM

## 2017-09-01 LAB — CBC
HCT: 31.6 % — ABNORMAL LOW (ref 36.0–46.0)
Hemoglobin: 11.1 g/dL — ABNORMAL LOW (ref 12.0–15.0)
MCH: 35 pg — AB (ref 26.0–34.0)
MCHC: 35.1 g/dL (ref 30.0–36.0)
MCV: 99.7 fL (ref 78.0–100.0)
PLATELETS: 62 10*3/uL — AB (ref 150–400)
RBC: 3.17 MIL/uL — ABNORMAL LOW (ref 3.87–5.11)
RDW: 14.4 % (ref 11.5–15.5)
WBC: 3.8 10*3/uL — ABNORMAL LOW (ref 4.0–10.5)

## 2017-09-01 LAB — COMPREHENSIVE METABOLIC PANEL
ALT: 59 U/L — AB (ref 14–54)
AST: 77 U/L — AB (ref 15–41)
Albumin: 2 g/dL — ABNORMAL LOW (ref 3.5–5.0)
Alkaline Phosphatase: 63 U/L (ref 38–126)
Anion gap: 5 (ref 5–15)
BUN: 13 mg/dL (ref 6–20)
CHLORIDE: 110 mmol/L (ref 101–111)
CO2: 21 mmol/L — AB (ref 22–32)
Calcium: 7.4 mg/dL — ABNORMAL LOW (ref 8.9–10.3)
Creatinine, Ser: 0.79 mg/dL (ref 0.44–1.00)
GFR calc Af Amer: >60 mL/min (ref >60)
GFR calc non Af Amer: >60 mL/min (ref >60)
GLUCOSE: 102 mg/dL — AB (ref 65–99)
Potassium: 3.5 mmol/L (ref 3.5–5.1)
SODIUM: 136 mmol/L (ref 135–145)
Total Bilirubin: 1.5 mg/dL — ABNORMAL HIGH (ref 0.3–1.2)
Total Protein: 5.5 g/dL — ABNORMAL LOW (ref 6.5–8.1)

## 2017-09-01 MED ORDER — IOPAMIDOL (ISOVUE-300) INJECTION 61%
INTRAVENOUS | Status: AC
  Administered 2017-09-01: 100 mL
  Filled 2017-09-01: qty 100

## 2017-09-01 NOTE — Progress Notes (Signed)
   Subjective:  Continues to feel better. Still with some chills and T-max overnight 100.3 yesterday evening. Hasn't had any abdominal pain and is tolerating PO intake. Does not she's been having diarrhea for the past 2 days but denies pain, cramping, color change or particularly foul odor. Asked good questions about cirrhosis and how to avoid its progression.    Objective:  Vital signs in last 24 hours: Vitals:   08/31/17 0507 08/31/17 1427 08/31/17 2055 09/01/17 0444  BP: 125/71 128/79 (!) 146/78 137/68  Pulse: 78 88 95 77  Resp: 16 17 17 17   Temp: 98.3 F (36.8 C) 98.6 F (37 C) 100.3 F (37.9 C) 98.3 F (36.8 C)  TempSrc: Oral Oral Oral Oral  SpO2: 100% 100% 100% 98%  Weight:      Height:       General: Laying in bed comfortably, NAD. Looks less jaundiced.  HEENT: [place]/AT, no scleral icterus Cardiac: RRR, systolic murmur present Pulm: normal effort, CTAB Abd: soft, non tender, non distended, BS normal Ext: extremities well perfused, no peripheral edema Neuro: alert and oriented X3, cranial nerves II-XII grossly intact  Assessment/Plan:  Principal Problem:   Transaminitis Active Problems:   Hepatitis C antibody test positive  Hepatocellular dysfunction, Hepatitis C, Cirrhosis w/ hx of EtOH Use CT abdomen/pelvis this morning demonstrated cirrhosis but no focal liver lesion. CT also demonstrated pyelonephritis of left kidney. HepC viral load pending, she will need referral to [facility] for treatment. Unfortunately, she continues to have family members present hindering discussion about HepC diagnosis.  -HCV RNA viral load pending -Has not required prn pain medication >48 hours, will dc morphine -Zofran for nausea  Pyelonephritis, E.coli urine culture CT today with pyelonephritis of left kidney which certainly was on our differential given her recent UTI and high fever. Patients fever curve much improved with T-max 100.3 over past 24 hours. Blood cultures so far with no growth  to date and ECHO did not demonstrate valvular vegetations. Urine culture with E.Coli which is pansensitive. She was broadened to Unasyn yesterday due to poor response to Ceftriaxone although I expect we can transition her to PO soon.   -f/u blood cultures -tylenol PRN -Unasyn 3 grams q6 hours  HTN Patient remains normotensive.  -Will continue to hold home antihypertensives and monitor.   AKI Resolved.  Heart Murmur ECHO EF 60-65%, no valvular abnormalities.    Dispo: Anticipated discharge in approximately 1 day(s).   Mary Karl, DO 09/01/2017, 8:00 AM Pager: [phone]

## 2017-09-02 DIAGNOSIS — F1099 Alcohol use, unspecified with unspecified alcohol-induced disorder: Secondary | ICD-10-CM

## 2017-09-02 DIAGNOSIS — K7689 Other specified diseases of liver: Secondary | ICD-10-CM

## 2017-09-02 DIAGNOSIS — R161 Splenomegaly, not elsewhere classified: Secondary | ICD-10-CM

## 2017-09-02 DIAGNOSIS — R011 Cardiac murmur, unspecified: Secondary | ICD-10-CM

## 2017-09-02 DIAGNOSIS — B1920 Unspecified viral hepatitis C without hepatic coma: Secondary | ICD-10-CM

## 2017-09-02 DIAGNOSIS — B9620 Unspecified Escherichia coli [E. coli] as the cause of diseases classified elsewhere: Secondary | ICD-10-CM

## 2017-09-02 DIAGNOSIS — N390 Urinary tract infection, site not specified: Secondary | ICD-10-CM

## 2017-09-02 DIAGNOSIS — D696 Thrombocytopenia, unspecified: Secondary | ICD-10-CM

## 2017-09-02 DIAGNOSIS — K7030 Alcoholic cirrhosis of liver without ascites: Secondary | ICD-10-CM

## 2017-09-02 DIAGNOSIS — N12 Tubulo-interstitial nephritis, not specified as acute or chronic: Secondary | ICD-10-CM

## 2017-09-02 DIAGNOSIS — Z79899 Other long term (current) drug therapy: Secondary | ICD-10-CM

## 2017-09-02 DIAGNOSIS — I10 Essential (primary) hypertension: Secondary | ICD-10-CM

## 2017-09-02 DIAGNOSIS — N179 Acute kidney failure, unspecified: Secondary | ICD-10-CM

## 2017-09-02 DIAGNOSIS — K8080 Other cholelithiasis without obstruction: Secondary | ICD-10-CM

## 2017-09-02 LAB — COMPREHENSIVE METABOLIC PANEL
ALK PHOS: 68 U/L (ref 38–126)
ALT: 79 U/L — AB (ref 14–54)
ANION GAP: 6 (ref 5–15)
AST: 126 U/L — ABNORMAL HIGH (ref 15–41)
Albumin: 2.2 g/dL — ABNORMAL LOW (ref 3.5–5.0)
BILIRUBIN TOTAL: 1.6 mg/dL — AB (ref 0.3–1.2)
BUN: 13 mg/dL (ref 6–20)
CALCIUM: 8 mg/dL — AB (ref 8.9–10.3)
CO2: 22 mmol/L (ref 22–32)
CREATININE: 0.78 mg/dL (ref 0.44–1.00)
Chloride: 109 mmol/L (ref 101–111)
GFR calc Af Amer: >60 mL/min (ref >60)
Glucose, Bld: 175 mg/dL — ABNORMAL HIGH (ref 65–99)
Potassium: 3.6 mmol/L (ref 3.5–5.1)
Sodium: 137 mmol/L (ref 135–145)
TOTAL PROTEIN: 6.1 g/dL — AB (ref 6.5–8.1)

## 2017-09-02 LAB — CBC
HCT: 35.9 % — ABNORMAL LOW (ref 36.0–46.0)
Hemoglobin: 12.2 g/dL (ref 12.0–15.0)
MCH: 34.8 pg — ABNORMAL HIGH (ref 26.0–34.0)
MCHC: 34 g/dL (ref 30.0–36.0)
MCV: 102.3 fL — ABNORMAL HIGH (ref 78.0–100.0)
PLATELETS: 78 10*3/uL — AB (ref 150–400)
RBC: 3.51 MIL/uL — ABNORMAL LOW (ref 3.87–5.11)
RDW: 14.9 % (ref 11.5–15.5)
WBC: 3.2 10*3/uL — ABNORMAL LOW (ref 4.0–10.5)

## 2017-09-02 MED ORDER — AMOXICILLIN-POT CLAVULANATE 875-125 MG PO TABS: 1.0000 | ORAL_TABLET | Freq: Two times a day (BID) | ORAL | 0 refills | Status: AC

## 2017-09-02 NOTE — Progress Notes (Signed)
Medicine attending discharge note: I personally examined this patient on the day of discharge and I attest to the accuracy of the discharge evaluation and plan as recorded in the final progress note by resident physician Dr. Landis MartinsSamantha Mary Anderson.  52 year old woman noted to have abnormal liver functions and thrombocytopenia since August 2018.  A remote CT scan in October 2007 showed a normal liver but splenomegaly.  She presented to the outpatient clinic on the day of the current admission with complaints of intermittent right upper quadrant tenderness, dysuria, frequency, sensation that she was not completely emptying her bladder.  She was febrile 101.6. Abdominal exam was unremarkable with no tenderness, mass, or organomegaly. She was mildly dehydrated.  White count normal at 8700 with 61% neutrophils. Signs of chronic liver dysfunction with bilirubin 2.5, albumin 2.8, prothrombin time 15.6 seconds, AST 123, ALT 106, normal alkaline phosphatase 84. Urine analysis with 3+ leukocytes, positive nitrite. She was started on antibiotics to cover both urinary tract infection and possibility of acute cholecystitis.  She received parenteral hydration and renal function normalized. Abdominal ultrasound showed nonobstructing gallstones.  No gallbladder wall thickening.  This was subsequently confirmed with a CT scan.  Nuclear medicine liver scan negative for acute cholecystitis.  She was seen in consultation by surgery but not felt to be in need of surgery. Urine grew E. coli sensitive to multiple antibiotics including ceftriaxone.  She received 4 days of parenteral antibiotics and will be discharged to complete a one-week course. CT scan done primarily to reevaluate her liver did show nodular changes in the liver without focal abnormalities and confirmed splenomegaly now at 20 cm in maximum dimension.  Hepatitis C screening was positive.  Quantitative viral load is pending.  Hepatitis a and B-. She denied prior  blood transfusions, any contaminated needle use, or previous hepatitis exposures.  Diagnosis of hepatitis C discussed with her and her husband.  She will be referred to our outpatient infectious disease center for further management. CT also suggested left pyelonephritis.  Disposition: Condition stable at time of discharge She will follow-up in our general medicine clinic and at the infectious disease center. There were no complications

## 2017-09-02 NOTE — Progress Notes (Addendum)
   Subjective:  Patient seen and examined. No acute events overnight, pt remained afebrile overnight. Denies abdominal pain, nausea, vomiting, or dysuria. Endorses diarrhea, but this has been going on for several days. Discussed hepatitis C diagnosis with the patient. Denies current or previous IV drug use or blood transfusions. No known exposures that she can recall. Patient is stable for discharge today, she is agreeable. [facility] follow up appointment .   Objective:  Vital signs in last 24 hours: Vitals:   09/01/17 0444 09/01/17 1427 09/01/17 2210 09/02/17 0510  BP: 137/68 116/64 133/75 (!) 107/56  Pulse: 77 70 71 63  Resp: 17 18 17 18   Temp: 98.3 F (36.8 C) 98.4 F (36.9 C) 98 F (36.7 C) 98 F (36.7 C)  TempSrc: Oral Oral    SpO2: 98% 100% 99% 100%  Weight:      Height:       General: Laying in bed comfortably, NAD HEENT: /AT, no scleral icterus Cardiac: RRR, systolic murmur present Pulm: normal effort, CTAB Abd: soft, non tender, non distended, BS normal, no CVA tenderness Ext: extremities well perfused, no peripheral edema Neuro: alert and oriented X3, cranial nerves II-XII grossly intact  Assessment/Plan:  Principal Problem:   Pyelonephritis Active Problems:   Transaminitis   Hepatitis C antibody test positive  Hepatocellular dysfunction, Hepatitis C, Cirrhosis w/ hx of EtOH Use CT abdomen/pelvis this morning demonstrated cirrhosis but no focal liver lesion. CT also demonstrated pyelonephritis of left kidney. HepC viral load pending, will need referral to [facility] for treatment. Discussed HepC diagnosis, patient denies previous exposure to blood transfusions or IV drugs use.  -HCV RNA viral load pending -Will place referral to ID clinic on discharge  Pyelonephritis, E.coli urine culture CT with pyelonephritis of left kidney which certainly was on our differential given her recent UTI and high fever. Patients fever curve much  improved, she has been afebrile for 24 hours.  Blood cultures NGTD and ECHO did not demonstrate valvular vegetations. Urine culture with E.Coli which is pansensitive. Responded well to Unasyn. Today is the 4th day IV abx. Will transition to PO Augmentin at discharge for a total treatment duration of 10 days.  -tylenol PRN -Unasyn 3 grams q6 hours -Will discharge with 6 more days of PO Augmentin  HTN Patient remains normotensive.  -Will continue home antihypertensives at discharge  AKI Resolved.  Heart Murmur ECHO EF 60-65%, no valvular abnormalities.    Dispo: Anticipated discharge today.   Toney RakesLacroce, Priseis Cratty J, MD 09/02/2017, 6:38 AM Pager: [phone]

## 2017-09-02 NOTE — Care Management Note (Signed)
Case Management Note  Patient Details  Name: Pearlie Oysterammy C Gerling MRN: [id number] Date of Birth: 02/10/1966  Subjective/Objective:                    Action/Plan:  [facility] Patient has no help or assistance with prescriptions at present. Provided and explained MATCH letter. Patient voiced understanding.  Expected Discharge Date:  09/02/17               Expected Discharge Plan:  Home/Self Care  In-House Referral:     Discharge planning Services  CM Consult, MATCH Program, Medication Assistance  Post Acute Care Choice:  NA Choice offered to:  Patient  DME Arranged:  N/A DME Agency:  NA  HH Arranged:  NA HH Agency:  NA  Status of Service:  Completed, signed off  If discussed at Long Length of Stay Meetings, dates discussed:    Additional Comments:  Kingsley PlanWile, Jaelen Soth Marie, RN 09/02/2017, 11:10 AM

## 2017-09-03 LAB — HEPATITIS C GENOTYPE

## 2017-09-03 LAB — HCV RNA QUANT RFLX ULTRA OR GENOTYP
HCV RNA Qnt(log copy/mL): 6.064 log10 IU/mL
HEPATITIS C QUANTITATION: 1160000 [IU]/mL

## 2017-09-03 NOTE — Discharge Summary (Addendum)
Name: Mary Anderson MRN: [id number] DOB: May 15, 1966 52 y.o. PCP: Burna Cash, MD  Date of Admission: 08/29/2017 10:36 PM Date of Discharge: 09/02/2017 Attending Physician: Cephas Darby, MD  Discharge Diagnosis: 1. Pyelonephritis Principal Problem:   Pyelonephritis Active Problems:   Transaminitis   Hepatitis C antibody test positive   Discharge Medications: Allergies as of 09/02/2017   No Known Allergies     Medication List    TAKE these medications   amoxicillin-clavulanate 875-125 MG tablet Commonly known as:  AUGMENTIN Take 1 tablet by mouth 2 (two) times daily for 7 days.   hydrochlorothiazide 12.5 MG tablet Commonly known as:  HYDRODIURIL Take 1 tablet (12.5 mg total) by mouth daily.   ibuprofen 200 MG tablet Commonly known as:  ADVIL,MOTRIN Take 200-400 mg by mouth every 6 (six) hours as needed for mild pain.   lisinopril 20 MG tablet Commonly known as:  PRINIVIL,ZESTRIL Take 1 tablet (20 mg total) by mouth daily.       Disposition and follow-up:   Mary Anderson was discharged from [facility] in Good condition.  At the hospital follow up visit please address:  1.  Pyelonephritis -Urine culture grew E. Coli, pan-sensitive; treated with four days of IV antibiotics (ceftriaxone x 1 day, followed by 3 days of Unasyn) -CT abdomen revealed left pyelonephritis  -Patient discharged with 7 days of Augmentin (for a total of 10 days abx), please address completion of antibiotic course -Blood cultures, no growth to date  Hepatitic C, Cirrhosis -Acute hepatitis panel positive for Hepatitis C antibodies - patient aware -Hep C viral load pending, please follow up -CTabdomen/pelvis demonstrated cirrhosis but no focal liver lesion -Patient referred to [facility] for treatment - please address follow up and referral if necessary  Cholelithiasis  -RUQ ultrasound with multiple layering gallstones. Mild with gallbladder wall thickening and  tenderness over the gallbladder during the study, concerning for acute cholecystitis. -HIDA scan was performed and was negative for acute cholecystitis -Please address outpatient referral to general surgery for elective cholecystectomy   Hypertension -Patient had low to normal blood pressures during admission, all BP meds were held -Instructed to resume lisinopril 20 mg and HCTZ 12.5 mg at discharge.   Systolic Murmur -ECHO performed, no valvular abnormalities, no systolic dysfunction; EF 60-65%  2.  Labs / imaging needed at time of follow-up: none  3.  Pending labs/ test needing follow-up: HCV RNA viral load  Follow-up Appointments: Follow-up Information    [facility]. Go on 09/10/2017.   Why:  Appointment scheduled for 2:45 on Tuesday, March 12th.  Contact information: [address] [place] [address] [phone]          Hospital Course by problem list: Principal Problem:   Pyelonephritis Active Problems:   Transaminitis   Hepatitis C antibody test positive   1. Cholelithiasis, AKI, Pyelonephritis, Hepatitic C, Cirrhosis Mary Anderson was admitted to [facility] and the Internal Medicine Teaching service for progressively worsening RUQ pain, as well as abnormal liver function tests. Upon initial presentation, the patient was febrile and her presentation was concerning for acute cholecystitis. She also had acute kidney injury with an elevated creatinine 1.4. RUQ ultrasound performed in the [facility], revealed cholelithiasis and gall bladder wall thickening. General surgery was consulted and a HIDA scan was performed, which did not show evidence of acute cholecystitis. She also was complaining of dysuria and urine analysis was indicative of a urinary tract infection. She was started  on Ceftriaxone and monitored for improvement. She continued to get recurrent fevers and her IV antibiotic was broadened to unasyn.   Given her elevated liver  enzymes, an acute hepatitis panel was obtained and was positive for hepatitis C antibodies, viral load pending. After several days of IV fluids, renal function returned to baseline and a CT abdomen/pelvis was performed. CT scan revealed liver changes consistent with cirrhosis, as well as evidence of left pyelonephritis. Urine culture grew E. Coli, which was pan-sensitive. She was treated with four days of IV antibiotics (ceftriaxone x 1 day, followed by 3 days of Unasyn). Patient discharged with 7 days of Augmentin (for a total of 10 days abx). [facility].  Hypertension Mary Anderson has a history of hypertension. She had low to normal blood pressures during admission. Blood pressure medications were not resumed during hospitalization. Instructed to resume lisinopril 20 mg and HCTZ 12.5 mg at discharge.   Discharge Vitals:   BP (!) 107/56   Pulse 63   Temp 98 F (36.7 C)   Resp 18   Ht 5\' 4"  (1.626 m)   Wt 178 lb 12.7 oz (81.1 kg)   LMP 04/22/2011   SpO2 100%   BMI 30.69 kg/m   Pertinent Labs, Studies, and Procedures:   US Abdomen, RUQ IMPRESSION: Heterogeneous echotexture within the liver with subtle nodularity to the contours suggesting early cirrhosis. Recommend clinical correlation. Cholelithiasis. There is gallbladder wall thickening. The patient was tender over the gallbladder during the study. Findings are concerning for acute cholecystitis.  NUCLEAR MEDICINE HEPATOBILIARY IMAGING IMPRESSION: Patent biliary tree. No scintigraphic evidence acute cholecystitis. Delayed blood pool clearance of tracer and mild parenchymal tracer retention within the liver suggesting hepatocellular dysfunction, recommend correlation with LFTs.  CT Abdomen Pelvis W Contrast IMPRESSION: Heterogeneous perfusion of the left kidney, nonspecific but favoring pyelonephritis. Cholelithiasis, without convincing associated findings to suggest acute cholecystitis. Cirrhosis.   No focal hepatic lesion is seen. Stigmata of portal hypertension, including splenomegaly, perigastric varices, and small volume abdominopelvic ascites. Portal vein is patent. Small right pleural effusion.  Transthoracic Echocardiogram Study Conclusions - Left ventricle: The cavity size was normal. Systolic function was   normal. The estimated ejection fraction was in the range of 60%   to 65%. Wall motion was normal; there were no regional wall   motion abnormalities. The study is not technically sufficient to   allow evaluation of LV diastolic function. - Aortic valve: Transvalvular velocity was within the normal range.   There was no stenosis. There was no regurgitation. - Mitral valve: Transvalvular velocity was within the normal range.   There was no evidence for stenosis. There was trivial   regurgitation. - Right ventricle: The cavity size was normal. Wall thickness was   normal. Systolic function was normal. - Right atrium: The atrium was dilated. - Atrial septum: No defect or patent foramen ovale was identified. - Tricuspid valve: There was trivial regurgitation. - Pulmonary arteries: PA peak pressure: 38 mm Hg (S).  CBC Latest Ref Rng & Units 09/02/2017 09/01/2017 08/31/2017  WBC 4.0 - 10.5 K/uL 3.2(L) 3.8(L) 7.3  Hemoglobin 12.0 - 15.0 g/dL 16.112.2 11.1(L) 10.0(L)  Hematocrit 36.0 - 46.0 % 35.9(L) 31.6(L) 29.2(L)  Platelets 150 - 400 K/uL 78(L) 62(L) 63(L)   CMP Latest Ref Rng & Units 09/02/2017 09/01/2017 08/31/2017  Glucose 65 - 99 mg/dL 096(E175(H) 454(U102(H) 981(X135(H)  BUN 6 - 20 mg/dL 13 13 18   Creatinine 0.44 - 1.00 mg/dL 9.140.78 7.820.79 9.56(O1.05(H)  Sodium 135 - 145 mmol/L 137 136 134(L)  Potassium 3.5 - 5.1 mmol/L 3.6 3.5 3.6  Chloride 101 - 111 mmol/L 109 110 106  CO2 22 - 32 mmol/L 22 21(L) 23  Calcium 8.9 - 10.3 mg/dL 8.0(L) 7.4(L) 7.1(L)  Total Protein 6.5 - 8.1 g/dL 6.1(L) 5.5(L) 5.2(L)  Total Bilirubin 0.3 - 1.2 mg/dL 1.6(X) 0.9(U) 1.9(H)  Alkaline Phos 38 - 126 U/L 68 63 69  AST 15 -  41 U/L 126(H) 77(H) 73(H)  ALT 14 - 54 U/L 79(H) 59(H) 65(H)    Discharge Instructions: Discharge Instructions    Ambulatory referral to Infectious Disease   Complete by:  As directed    Hepatitis C positive.   Call MD for:  persistant nausea and vomiting   Complete by:  As directed    Call MD for:  severe uncontrolled pain   Complete by:  As directed    Call MD for:  temperature >100.4   Complete by:  As directed    Diet - low sodium heart healthy   Complete by:  As directed    Discharge instructions   Complete by:  As directed    Please print patient work note from Letters tab and provide with discharge paper work.    Ms. Vanvleck,   You had a urinary tract infection that was complicated by an infection in your kidney called pyelonephritis. This was likely the cause of your high fevers. You received IV antibiotics for 4 days. I have prescribed you 6 more days of antibiotics, for a total of 10 days. The antibiotic I prescribed you is called Augmentin. Please take 875 mg of Augmentin twice daily for a total of six days. I have prescribed you 13 pills. Take one pill this evening when you get home from the hospital. Starting tomorrow take one pill in the morning and one pill 12 hours later until you have completed the course.  You were also found to have abnormal liver function tests and at some point you were exposed to the Hepatitis C virus. This virus along with alcohol use can cause scarring of the liver called cirrhosis. I have referred to infectious disease specialist to start treatment for your hepatitis C.   Imaging of your abdomen also showed that you have stones in your gallbladder. It is possible that you abdominal pain was caused by these stones. You may want to ask for referral to see a surgeon and have your gallbladder removed, as these stones put you at risk for gallbladder infection.   I have made you an appointment in clinic to follow up this hospitalization, it is on March  12 at 2:45 PM.   Increase activity slowly   Complete by:  As directed       Signed: Toney Rakes, MD 09/03/2017, 10:32 AM   Pager: [phone]

## 2017-09-04 LAB — CULTURE, BLOOD (ROUTINE X 2)
CULTURE: NO GROWTH
Culture: NO GROWTH
SPECIAL REQUESTS: ADEQUATE
Special Requests: ADEQUATE

## 2017-09-10 ENCOUNTER — Encounter: Payer: Self-pay | Admitting: Internal Medicine

## 2017-09-10 ENCOUNTER — Ambulatory Visit: Payer: Self-pay | Admitting: Internal Medicine

## 2017-09-10 VITALS — BP 141/90 | HR 56 | Temp 98.0°F | Wt 181.5 lb

## 2017-09-10 DIAGNOSIS — N12 Tubulo-interstitial nephritis, not specified as acute or chronic: Secondary | ICD-10-CM

## 2017-09-10 DIAGNOSIS — I10 Essential (primary) hypertension: Secondary | ICD-10-CM

## 2017-09-10 DIAGNOSIS — K8020 Calculus of gallbladder without cholecystitis without obstruction: Secondary | ICD-10-CM

## 2017-09-10 DIAGNOSIS — R011 Cardiac murmur, unspecified: Secondary | ICD-10-CM

## 2017-09-10 DIAGNOSIS — R768 Other specified abnormal immunological findings in serum: Secondary | ICD-10-CM

## 2017-09-10 DIAGNOSIS — B1920 Unspecified viral hepatitis C without hepatic coma: Secondary | ICD-10-CM

## 2017-09-10 NOTE — Assessment & Plan Note (Signed)
She has positive viral load with genotype 1a. She has her first ID appointment tomorrow at 3:30 PM to start treatment for hep C.  She was advised to go for her ID appointment.

## 2017-09-10 NOTE — Progress Notes (Signed)
   CC: For hospital follow-up-being admitted for pyelonephritis.  HPI:  Mary Anderson is a 52 y.o. with past medical history significant for hypertension came to the clinic for her hospital follow-up. She was admitted from August 29, 2017 until September 02, 2017, where she was diagnosed with left pyelonephritis, urine culture grew pansensitive E. Coli, blood cultures remain negative, she was initially treated with ceftriaxone followed by Unasyn and discharge home on Augmentin.  She completed her antibiotics this morning.  She denies any more urinary symptoms and feeling well.  During hospitalization she was having abnormal liver function which prompted further investigation and she was diagnosed with hepatitis C with positive viral load and genotype 1 a.  She has her ID appointment tomorrow at 3:30 PM. CT abdomen done during hospitalization shows liver cirrhosis and gallstones, HIDA scan was negative for any cholecystitis.  Patient has no complaints today and would like to continue her work get orange card.  Past Medical History:  Diagnosis Date  . Hypertension    Review of Systems: Negative except mentioned in HPI.  Physical Exam:  Vitals:   09/10/17 1449  BP: (!) 154/81  Pulse: 62  Temp: 98 F (36.7 C)  TempSrc: Oral  SpO2: 100%  Weight: 181 lb 8 oz (82.3 kg)    General: Vital signs reviewed.  Patient is well-developed and well-nourished, in no acute distress and cooperative with exam.  Head: Normocephalic and atraumatic. Eyes: EOMI, conjunctivae normal, no scleral icterus.  Cardiovascular: RRR,  systolic murmur. Pulmonary/Chest: Clear to auscultation bilaterally, no wheezes, rales, or rhonchi. Abdominal: Soft, non-tender, non-distended, BS +, no masses, organomegaly, or guarding present.  Extremities: No lower extremity edema bilaterally,  pulses symmetric and intact bilaterally. No cyanosis or clubbing. Skin: Warm, dry and intact. No rashes or erythema. Psychiatric:  Normal mood and affect. speech and behavior is normal. Cognition and memory are normal.  Assessment & Plan:   See Encounters Tab for problem based charting.  Patient discussed with Dr. Sandre Kittyaines.

## 2017-09-10 NOTE — Assessment & Plan Note (Signed)
Current CT scan shows gallstones.  HIDA scan was negative for any cholecystitis.  Patient is asymptomatic currently. She will wait to get an orange card before getting a referral for general surgery.  She was advised to contact clinic if she develop any symptoms. We will send her for general surgery evaluation once she had orange card.

## 2017-09-10 NOTE — Assessment & Plan Note (Signed)
Symptoms has been resolved and she completed her antibiotic course.

## 2017-09-10 NOTE — Assessment & Plan Note (Signed)
BP Readings from Last 3 Encounters:  09/10/17 (!) 141/90  09/02/17 (!) 107/56  08/29/17 (!) 151/86   Blood pressure was little elevated today, on initial check it was 154/81 which improved to 141/90 on recheck. During her hospital stay her blood pressure was soft to within normal limit and her antihypertensive were held.  Her lisinopril and HCTZ was restarted on discharge.  -We will continue current management with lisinopril 20 mg daily and HCTZ 12.5 mg daily.  We will reevaluate during next follow-up visit.

## 2017-09-10 NOTE — Patient Instructions (Addendum)
Thank you for visiting clinic today. I am glad you are doing well. Please go for your appointment with infectious disease for your hepatitis C treatment. Try working on your orange card-once you have it we will refer you to general surgery for your gallstones.  Meanwhile if your symptoms get worse please contact our clinic. Your blood pressure was little high today-we are not making any changes to your medication at this time, we will reevaluate during your next follow-up visit with PCP in 1 month.

## 2017-09-11 ENCOUNTER — Ambulatory Visit (INDEPENDENT_AMBULATORY_CARE_PROVIDER_SITE_OTHER): Payer: Self-pay | Admitting: Internal Medicine

## 2017-09-11 ENCOUNTER — Encounter: Payer: Self-pay | Admitting: Internal Medicine

## 2017-09-11 DIAGNOSIS — R161 Splenomegaly, not elsewhere classified: Secondary | ICD-10-CM

## 2017-09-11 DIAGNOSIS — B182 Chronic viral hepatitis C: Secondary | ICD-10-CM

## 2017-09-11 DIAGNOSIS — D696 Thrombocytopenia, unspecified: Secondary | ICD-10-CM

## 2017-09-11 DIAGNOSIS — K7460 Unspecified cirrhosis of liver: Secondary | ICD-10-CM

## 2017-09-11 MED ORDER — SOFOSBUVIR-VELPATASVIR 400-100 MG PO TABS: 1.0000 | ORAL_TABLET | Freq: Every day | ORAL | 5 refills | Status: DC
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
## 2017-09-12 NOTE — Progress Notes (Signed)
Internal Medicine Clinic Attending  Case discussed with Dr. Nelson ChimesAmin  at the time of the visit.  We reviewed the resident's history and exam and pertinent patient test results.  I agree with the assessment, diagnosis, and plan of care documented in the resident's note.  Doing better after treatment for pyelonephritis. Will see ID clinic for treatment of HepC.   To be clear, there is no need to refer to surgery for asymptomatic gallstones. Only about a quarter of patients will develop symptoms over the next decade or so. If she develops symptoms or complications of gallstones, would need referral at that point for possible cholecystectomy.  Anne ShutterAlexander N Raines, MD

## 2017-10-31 ENCOUNTER — Ambulatory Visit: Payer: Self-pay

## 2017-11-22 ENCOUNTER — Telehealth: Payer: Self-pay | Admitting: Pharmacist Clinician (PhC)/ Clinical Pharmacy Specialist

## 2017-11-22 NOTE — Telephone Encounter (Signed)
[employer] needs 2 pay stubs. Only one was sent it. And if the other person in the household contribute any income.

## 2017-12-02 ENCOUNTER — Other Ambulatory Visit: Payer: Self-pay | Admitting: Pharmacist Clinician (PhC)/ Clinical Pharmacy Specialist

## 2017-12-02 MED ORDER — SOFOSBUVIR-VELPATASVIR 400-100 MG PO TABS: 1.0000 | ORAL_TABLET | Freq: Every day | ORAL | 5 refills | Status: DC

## 2017-12-24 ENCOUNTER — Telehealth: Payer: Self-pay | Admitting: Pharmacy Technician

## 2017-12-24 NOTE — Telephone Encounter (Signed)
Left multiple voicemails between 11/27/17 and 12/24/17 needing income information to be approved for Hep C medication through [employer].  She cannot be approved without the information.  Over a month of trying to reach her.  Her home phone number rings without a voicemail.

## 2018-01-20 ENCOUNTER — Other Ambulatory Visit: Payer: Self-pay | Admitting: Internal Medicine

## 2018-02-24 ENCOUNTER — Telehealth: Payer: Self-pay | Admitting: Pharmacy Technician

## 2018-02-24 NOTE — Telephone Encounter (Signed)
Cannot reach for copay assistance.  Left multiple voicemails.

## 2018-07-23 ENCOUNTER — Other Ambulatory Visit: Payer: Self-pay | Admitting: Internal Medicine

## 2018-07-28 NOTE — Telephone Encounter (Signed)
Sent prescription for 1 month only. Needs appointment with me within the next 4 weeks. If no available spots in my schedule should at least be seen in [place] for hypertension follow up. Will not refill medications again if she is not seen.

## 2018-08-22 ENCOUNTER — Encounter: Payer: Self-pay | Admitting: Internal Medicine

## 2018-08-26 ENCOUNTER — Encounter: Payer: Self-pay | Admitting: Internal Medicine

## 2018-11-07 ENCOUNTER — Encounter: Payer: Self-pay | Admitting: Internal Medicine

## 2018-11-07 ENCOUNTER — Other Ambulatory Visit: Payer: Self-pay

## 2018-11-09 ENCOUNTER — Telehealth: Payer: Self-pay | Admitting: Internal Medicine

## 2018-11-09 NOTE — Telephone Encounter (Signed)
Called patient x3 times for telehealth appointment. No answer. Left a voice message to return call.

## 2019-01-23 ENCOUNTER — Emergency Department (HOSPITAL_COMMUNITY)
Admission: EM | Admit: 2019-01-23 | Discharge: 2019-01-23 | Disposition: A | Discharge status: Home / Self Care | Payer: HRSA Program | Attending: Emergency Medicine | Admitting: Emergency Medicine

## 2019-01-23 ENCOUNTER — Other Ambulatory Visit: Payer: Self-pay

## 2019-01-23 DIAGNOSIS — Z20828 Contact with and (suspected) exposure to other viral communicable diseases: Secondary | ICD-10-CM | POA: Diagnosis not present

## 2019-01-23 DIAGNOSIS — I10 Essential (primary) hypertension: Secondary | ICD-10-CM | POA: Diagnosis not present

## 2019-01-23 DIAGNOSIS — Z79899 Other long term (current) drug therapy: Secondary | ICD-10-CM | POA: Diagnosis not present

## 2019-01-23 DIAGNOSIS — Z20822 Contact with and (suspected) exposure to covid-19: Secondary | ICD-10-CM

## 2019-01-23 NOTE — ED Provider Notes (Signed)
[facility]   CSN: [id number] Arrival date & time: 01/23/19  1426     History   Chief Complaint Chief Complaint  Patient presents with  . Needs covid test    HPI Mary Anderson is a 53 y.o. female who presents for a COVID test.  Patient states that she works at [employer] in [place] and multiple employees have had COVID.  She was pulled from her job today at 1:00 and told she needed to come to the emergency department for COVID testing and she cannot come back to work until she has this done.  Patient denies any symptoms.  No fever, cough, shortness of breath.     HPI  Past Medical History:  Diagnosis Date  . Hypertension     Patient Active Problem List   Diagnosis Date Noted  . Cirrhosis (HCC) 09/11/2017  . Splenomegaly 09/11/2017  . Thrombocytopenia (HCC) 09/11/2017  . Calculus of gallbladder without cholecystitis without obstruction 09/10/2017  . Chronic hepatitis C (HCC) 08/30/2017  . Hypertension 07/31/2017    No past surgical history on file.   OB History   No obstetric history on file.      Home Medications    Prior to Admission medications   Medication Sig Start Date End Date Taking? Authorizing Provider  hydrochlorothiazide (HYDRODIURIL) 12.5 MG tablet TAKE 1 TABLET BY MOUTH EVERY DAY 07/28/18   Burna CashSantos-Sanchez, Idalys, MD  ibuprofen (ADVIL,MOTRIN) 200 MG tablet Take 200-400 mg by mouth every 6 (six) hours as needed for mild pain.     [provider]  lisinopril (PRINIVIL,ZESTRIL) 20 MG tablet TAKE 1 TABLET BY MOUTH EVERY DAY 07/28/18   Burna CashSantos-Sanchez, Idalys, MD  Sofosbuvir-Velpatasvir (EPCLUSA) 400-100 MG TABS Take 1 tablet by mouth daily with breakfast. 12/02/17   Nicholes CalamityPham, Minh Q, RPH-CPP    Family History Family History  Problem Relation Age of Onset  . Hypertension Mother     Social History Social History   Tobacco Use  . Smoking status: Never Smoker  . Smokeless tobacco: Never Used   Substance Use Topics  . Alcohol use: Yes  . Drug use: No     Allergies   Patient has no known allergies.   Review of Systems Review of Systems  Constitutional: Negative for fever.  Respiratory: Negative for shortness of breath.      Physical Exam Updated Vital Signs BP (!) 160/92 (BP Location: Right Arm)   Pulse 72   Temp 97.9 F (36.6 C) (Oral)   Resp 16   LMP 04/22/2011   SpO2 95%   Physical Exam Vitals signs and nursing note reviewed.  Constitutional:      General: She is not in acute distress.    Appearance: Normal appearance. She is well-developed.  HENT:     Head: Normocephalic and atraumatic.  Eyes:     General: No scleral icterus.       Right eye: No discharge.        Left eye: No discharge.     Conjunctiva/sclera: Conjunctivae normal.     Pupils: Pupils are equal, round, and reactive to light.  Neck:     Musculoskeletal: Normal range of motion.  Cardiovascular:     Rate and Rhythm: Normal rate.  Pulmonary:     Effort: Pulmonary effort is normal. No respiratory distress.  Abdominal:     General: There is no distension.  Skin:    General: Skin is warm and dry.  Neurological:  Mental Status: She is alert and oriented to person, place, and time.  Psychiatric:        Behavior: Behavior normal.      ED Treatments / Results  Labs (all labs ordered are listed, but only abnormal results are displayed) Labs Reviewed  NOVEL CORONAVIRUS, NAA (HOSPITAL ORDER, SEND-OUT TO REF LAB)    EKG None  Radiology No results found.  Procedures Procedures (including critical care time)  Medications Ordered in ED Medications - No data to display   Initial Impression / Assessment and Plan / ED Course  I have reviewed the triage vital signs and the nursing notes.  Pertinent labs & imaging results that were available during my care of the patient were reviewed by me and considered in my medical decision making (see chart for details).  53 year old  female presents for COVID testing. She is asymptomatic. Send-out test was utilized. She was advised to f/u on results in the next 3-4 days.  Final Clinical Impressions(s) / ED Diagnoses   Final diagnoses:  Close Exposure to Covid-19 Virus    ED Discharge Orders    None       Recardo Evangelist, PA-C 01/23/19 1630    Virgel Manifold, MD 01/24/19 1538

## 2019-01-23 NOTE — ED Triage Notes (Signed)
Pt sent by employer for covid testing. Pt has no sx and no complaints but told that she could not come back to work until she had covid test. States that covid is going around at work.

## 2019-01-24 LAB — NOVEL CORONAVIRUS, NAA (HOSP ORDER, SEND-OUT TO REF LAB; TAT 18-24 HRS): SARS-CoV-2, NAA: NOT DETECTED

## 2019-04-10 ENCOUNTER — Other Ambulatory Visit: Payer: Self-pay

## 2019-04-10 DIAGNOSIS — Z20822 Contact with and (suspected) exposure to covid-19: Secondary | ICD-10-CM

## 2019-04-11 LAB — NOVEL CORONAVIRUS, NAA: SARS-CoV-2, NAA: NOT DETECTED

## 2019-06-01 ENCOUNTER — Encounter: Payer: Self-pay | Admitting: Internal Medicine

## 2019-10-26 ENCOUNTER — Encounter: Payer: Self-pay | Admitting: *Deleted

## 2020-01-14 ENCOUNTER — Encounter: Payer: Self-pay | Admitting: Student

## 2021-10-20 ENCOUNTER — Ambulatory Visit (HOSPITAL_COMMUNITY)
Admission: EM | Admit: 2021-10-20 | Discharge: 2021-10-20 | Disposition: A | Discharge status: Home / Self Care | Payer: Self-pay | Attending: Family Medicine | Admitting: Family Medicine

## 2021-10-20 ENCOUNTER — Ambulatory Visit (INDEPENDENT_AMBULATORY_CARE_PROVIDER_SITE_OTHER): Payer: Self-pay

## 2021-10-20 DIAGNOSIS — W19XXXA Unspecified fall, initial encounter: Secondary | ICD-10-CM

## 2021-10-20 DIAGNOSIS — J189 Pneumonia, unspecified organism: Secondary | ICD-10-CM

## 2021-10-20 DIAGNOSIS — R0782 Intercostal pain: Secondary | ICD-10-CM

## 2021-10-20 MED ORDER — DOXYCYCLINE HYCLATE 100 MG PO CAPS
100.0000 mg | ORAL_CAPSULE | Freq: Two times a day (BID) | ORAL | 0 refills | Status: DC
Start: 2021-10-20 — End: 2021-10-30

## 2021-10-20 MED ORDER — PREDNISONE 20 MG PO TABS: 20.0000 mg | ORAL_TABLET | Freq: Every day | ORAL | 0 refills | Status: DC

## 2021-10-20 MED ORDER — PROMETHAZINE-DM 6.25-15 MG/5ML PO SYRP: 5.0000 mL | ORAL_SOLUTION | Freq: Three times a day (TID) | ORAL | 0 refills | Status: DC | PRN

## 2021-10-20 NOTE — ED Triage Notes (Signed)
C/o right arm pain after fall 3 days ago.  ?

## 2021-10-20 NOTE — ED Provider Notes (Addendum)
?[place] ? ? ? ?CSN: [id number] ?Arrival date & time: 10/20/21  1737 ? ? ?  ? ?History   ?Chief Complaint ?No chief complaint on file. ? ? ?HPI ?Mary Anderson is a 56 y.o. female.  ? ?HPI ?Patient presents today with a complaint of right small pain immediately below the axilla which she developed 3 days ago after sustaining a fall at home.  Patient reports tripping while walking on the sidewalk and landing with her full weightbearing onto her left side.  Unrelated patient also reports that she has had over a few weeks of a persistent cough which is occasionally productive, wheezing, and nasal congestion.  She denies any known fever.  Denies any overt shortness of breath but endorses fatigue.  Patient reports she is a non-smoker although is exposed to second hand smoke from her husband. ?Past Medical History:  ?Diagnosis Date  ? Hypertension   ? ? ?Patient Active Problem List  ? Diagnosis Date Noted  ? Cirrhosis ([place]) 09/11/2017  ? Splenomegaly 09/11/2017  ? Thrombocytopenia ([place]) 09/11/2017  ? Calculus of gallbladder without cholecystitis without obstruction 09/10/2017  ? Chronic hepatitis C ([place]) 08/30/2017  ? Hypertension 07/31/2017  ? ? ?No past surgical history on file. ? ?OB History   ?No obstetric history on file. ?  ? ? ? ?Home Medications   ? ?Prior to Admission medications   ?Medication Sig Start Date End Date Taking? Authorizing Provider  ?hydrochlorothiazide (HYDRODIURIL) 12.5 MG tablet TAKE 1 TABLET BY MOUTH EVERY DAY 07/28/18   Welford Roche, MD  ?ibuprofen (ADVIL,MOTRIN) 200 MG tablet Take 200-400 mg by mouth every 6 (six) hours as needed for mild pain.     [provider]  ?lisinopril (PRINIVIL,ZESTRIL) 20 MG tablet TAKE 1 TABLET BY MOUTH EVERY DAY 07/28/18   Welford Roche, MD  ?Sofosbuvir-Velpatasvir (EPCLUSA) 400-100 MG TABS Take 1 tablet by mouth daily with breakfast. 12/02/17   Dinah Beers, RPH-CPP  ? ? ?Family History ?Family History  ?Problem Relation Age of  Onset  ? Hypertension Mother   ? ? ?Social History ?Social History  ? ?Tobacco Use  ? Smoking status: Never  ? Smokeless tobacco: Never  ?Substance Use Topics  ? Alcohol use: Yes  ? Drug use: No  ? ? ? ?Allergies   ?Patient has no known allergies. ? ? ?Review of Systems ?Review of Systems ?Pertinent negatives listed in HPI  ? ?Physical Exam ?Triage Vital Signs ?ED Triage Vitals  ?Enc Vitals Group  ?   BP 10/20/21 1826 (!) 166/112  ?   Pulse Rate 10/20/21 1827 92  ?   Resp 10/20/21 1826 18  ?   Temp 10/20/21 1827 97.8 ?F (36.6 ?C)  ?   Temp Source 10/20/21 1827 Oral  ?   SpO2 10/20/21 1826 100 %  ?   Weight --   ?   Height --   ?   Head Circumference --   ?   Peak Flow --   ?   Pain Score --   ?   Pain Loc --   ?   Pain Edu? --   ?   Excl. in [place]? --   ? ?No data found. ? ?Updated Vital Signs ?BP (!) 166/112 (BP Location: Left Arm)   Pulse 92   Temp 97.8 ?F (36.6 ?C) (Oral)   Resp 17   LMP 04/22/2011   SpO2 100%  ? ?Visual Acuity ?Right Eye Distance:   ?Left Eye Distance:   ?Bilateral  Distance:   ? ?Right Eye Near:   ?Left Eye Near:    ?Bilateral Near:    ? ?Physical Exam ?Constitutional:   ?   Appearance: Normal appearance.  ?HENT:  ?   Nose: Nose normal.  ?Eyes:  ?   Extraocular Movements: Extraocular movements intact.  ?   Conjunctiva/sclera: Conjunctivae normal.  ?   Pupils: Pupils are equal, round, and reactive to light.  ?Cardiovascular:  ?   Rate and Rhythm: Normal rate and regular rhythm.  ?Pulmonary:  ?   Breath sounds: Wheezing and rhonchi present.  ?Chest:  ?   Chest wall: Tenderness present.  ? ? ?Skin: ?   Capillary Refill: Capillary refill takes less than 2 seconds.  ?Neurological:  ?   General: No focal deficit present.  ?   Mental Status: She is alert and oriented to person, place, and time.  ?Psychiatric:     ?   Mood and Affect: Mood normal.     ?   Behavior: Behavior normal.  ? ?UC Treatments / Results  ?Labs ?(all labs ordered are listed, but only abnormal results are displayed) ?Labs Reviewed  - No data to display ? ?EKG ? ? ?Radiology ?DG Ribs Unilateral W/Chest Right ? ?Result Date: 10/20/2021 ?CLINICAL DATA:  Status post fall 3 days ago with pain under the right axilla. EXAM: RIGHT RIBS AND CHEST - 3+ VIEW COMPARISON:  August 29, 2017 FINDINGS: No fracture or other bone lesions are seen involving the ribs. There is no evidence of pneumothorax or pleural effusion. There is opacification of the right lung base. Heart size and mediastinal contours are within normal limits. IMPRESSION: 1. No acute right-sided rib fracture or other bone lesions. 2. Opacification of the right lung base which may represent atelectasis and/or pneumonia. Electronically Signed   By: Virgina Norfolk M.D.   On: 10/20/2021 19:38   ? ?Procedures ?Procedures (including critical care time) ? ?Medications Ordered in UC ?Medications - No data to display ? ?Initial Impression / Assessment and Plan / UC Course  ?I have reviewed the triage vital signs and the nursing notes. ? ?Pertinent labs & imaging results that were available during my care of the patient were reviewed by me and considered in my medical decision making (see chart for details). ? ?  ?Pneumonia of right upper lobe due infectious ?Prednisone 20 mg daily x 5 days. ?Doxycycline 100 mg BID x 10 days. ?Promethazine-DM TID PRN  ?Strict return precautions if symptoms worsen or do not improve. ?Final Clinical Impressions(s) / UC Diagnoses  ? ?Final diagnoses:  ?Pneumonia of right upper lobe due to infectious organism  ? ?Discharge Instructions   ?None ?  ? ?ED Prescriptions   ? ? Medication Sig Dispense Auth. Provider  ? predniSONE (DELTASONE) 20 MG tablet Take 1 tablet (20 mg total) by mouth daily with breakfast. 5 tablet Scot Jun, FNP  ? doxycycline (VIBRAMYCIN) 100 MG capsule Take 1 capsule (100 mg total) by mouth 2 (two) times daily. 20 capsule Scot Jun, FNP  ? promethazine-dextromethorphan (PROMETHAZINE-DM) 6.25-15 MG/5ML syrup Take 5 mLs by mouth 3  (three) times daily as needed for cough. 180 mL Scot Jun, FNP  ? ?  ? ?PDMP not reviewed this encounter. ?  ?Scot Jun, FNP ?10/20/21 2048 ? ?  ?Scot Jun, FNP ?10/20/21 2049 ? ?

## 2021-10-26 ENCOUNTER — Encounter (HOSPITAL_COMMUNITY): Payer: Self-pay | Admitting: Emergency Medicine

## 2021-10-26 ENCOUNTER — Emergency Department (HOSPITAL_COMMUNITY): Payer: Self-pay

## 2021-10-26 ENCOUNTER — Other Ambulatory Visit: Payer: Self-pay

## 2021-10-26 ENCOUNTER — Inpatient Hospital Stay (HOSPITAL_COMMUNITY)
Admission: EM | Admit: 2021-10-26 | Discharge: 2021-10-30 | DRG: 291 | Disposition: A | Discharge status: Home / Self Care | Payer: Self-pay | Attending: Internal Medicine | Admitting: Internal Medicine

## 2021-10-26 ENCOUNTER — Ambulatory Visit (INDEPENDENT_AMBULATORY_CARE_PROVIDER_SITE_OTHER): Payer: Self-pay

## 2021-10-26 ENCOUNTER — Ambulatory Visit (HOSPITAL_COMMUNITY)
Admission: EM | Admit: 2021-10-26 | Discharge: 2021-10-26 | Disposition: A | Discharge status: Home / Self Care | Payer: Self-pay | Attending: Emergency Medicine | Admitting: Emergency Medicine

## 2021-10-26 DIAGNOSIS — J918 Pleural effusion in other conditions classified elsewhere: Principal | ICD-10-CM

## 2021-10-26 DIAGNOSIS — I119 Hypertensive heart disease without heart failure: Secondary | ICD-10-CM

## 2021-10-26 DIAGNOSIS — D539 Nutritional anemia, unspecified: Secondary | ICD-10-CM

## 2021-10-26 DIAGNOSIS — K7011 Alcoholic hepatitis with ascites: Secondary | ICD-10-CM | POA: Diagnosis present

## 2021-10-26 DIAGNOSIS — Z20822 Contact with and (suspected) exposure to covid-19: Secondary | ICD-10-CM | POA: Diagnosis present

## 2021-10-26 DIAGNOSIS — K7031 Alcoholic cirrhosis of liver with ascites: Secondary | ICD-10-CM

## 2021-10-26 DIAGNOSIS — R161 Splenomegaly, not elsewhere classified: Secondary | ICD-10-CM | POA: Diagnosis present

## 2021-10-26 DIAGNOSIS — Z91199 Patient's noncompliance with other medical treatment and regimen due to unspecified reason: Secondary | ICD-10-CM

## 2021-10-26 DIAGNOSIS — R0602 Shortness of breath: Secondary | ICD-10-CM

## 2021-10-26 DIAGNOSIS — R0600 Dyspnea, unspecified: Secondary | ICD-10-CM

## 2021-10-26 DIAGNOSIS — J90 Pleural effusion, not elsewhere classified: Secondary | ICD-10-CM

## 2021-10-26 DIAGNOSIS — K766 Portal hypertension: Secondary | ICD-10-CM | POA: Diagnosis present

## 2021-10-26 DIAGNOSIS — R609 Edema, unspecified: Secondary | ICD-10-CM

## 2021-10-26 DIAGNOSIS — K7460 Unspecified cirrhosis of liver: Secondary | ICD-10-CM | POA: Diagnosis present

## 2021-10-26 DIAGNOSIS — Z91148 Patient's other noncompliance with medication regimen for other reason: Secondary | ICD-10-CM

## 2021-10-26 DIAGNOSIS — I10 Essential (primary) hypertension: Secondary | ICD-10-CM

## 2021-10-26 DIAGNOSIS — E8809 Other disorders of plasma-protein metabolism, not elsewhere classified: Secondary | ICD-10-CM

## 2021-10-26 DIAGNOSIS — Z8249 Family history of ischemic heart disease and other diseases of the circulatory system: Secondary | ICD-10-CM

## 2021-10-26 DIAGNOSIS — D696 Thrombocytopenia, unspecified: Secondary | ICD-10-CM

## 2021-10-26 DIAGNOSIS — I160 Hypertensive urgency: Secondary | ICD-10-CM | POA: Diagnosis present

## 2021-10-26 DIAGNOSIS — R7989 Other specified abnormal findings of blood chemistry: Secondary | ICD-10-CM

## 2021-10-26 DIAGNOSIS — Z79899 Other long term (current) drug therapy: Secondary | ICD-10-CM

## 2021-10-26 DIAGNOSIS — R601 Generalized edema: Secondary | ICD-10-CM

## 2021-10-26 DIAGNOSIS — I110 Hypertensive heart disease with heart failure: Principal | ICD-10-CM | POA: Diagnosis present

## 2021-10-26 DIAGNOSIS — E876 Hypokalemia: Secondary | ICD-10-CM

## 2021-10-26 DIAGNOSIS — F1020 Alcohol dependence, uncomplicated: Secondary | ICD-10-CM

## 2021-10-26 DIAGNOSIS — I5033 Acute on chronic diastolic (congestive) heart failure: Secondary | ICD-10-CM | POA: Diagnosis present

## 2021-10-26 DIAGNOSIS — B1920 Unspecified viral hepatitis C without hepatic coma: Secondary | ICD-10-CM | POA: Diagnosis present

## 2021-10-26 DIAGNOSIS — B182 Chronic viral hepatitis C: Secondary | ICD-10-CM | POA: Diagnosis present

## 2021-10-26 LAB — CBC
HCT: 41 % (ref 36.0–46.0)
Hemoglobin: 13.6 g/dL (ref 12.0–15.0)
MCH: 35.9 pg — ABNORMAL HIGH (ref 26.0–34.0)
MCHC: 33.2 g/dL (ref 30.0–36.0)
MCV: 108.2 fL — ABNORMAL HIGH (ref 80.0–100.0)
Platelets: 76 10*3/uL — ABNORMAL LOW (ref 150–400)
RBC: 3.79 MIL/uL — ABNORMAL LOW (ref 3.87–5.11)
RDW: 14.6 % (ref 11.5–15.5)
WBC: 6.9 10*3/uL (ref 4.0–10.5)
nRBC: 0 % (ref 0.0–0.2)

## 2021-10-26 LAB — I-STAT BETA HCG BLOOD, ED (MC, WL, AP ONLY): I-stat hCG, quantitative: <5 m[IU]/mL (ref <5)

## 2021-10-26 LAB — HEPATIC FUNCTION PANEL
ALT: 75 U/L — ABNORMAL HIGH (ref 0–44)
AST: 98 U/L — ABNORMAL HIGH (ref 15–41)
Albumin: 2.8 g/dL — ABNORMAL LOW (ref 3.5–5.0)
Alkaline Phosphatase: 104 U/L (ref 38–126)
Bilirubin, Direct: 1.2 mg/dL — ABNORMAL HIGH (ref 0.0–0.2)
Indirect Bilirubin: 2.1 mg/dL — ABNORMAL HIGH (ref 0.3–0.9)
Total Bilirubin: 3.3 mg/dL — ABNORMAL HIGH (ref 0.3–1.2)
Total Protein: 7.5 g/dL (ref 6.5–8.1)

## 2021-10-26 LAB — BASIC METABOLIC PANEL
Anion gap: 7 (ref 5–15)
BUN: 14 mg/dL (ref 6–20)
CO2: 30 mmol/L (ref 22–32)
Calcium: 8.6 mg/dL — ABNORMAL LOW (ref 8.9–10.3)
Chloride: 106 mmol/L (ref 98–111)
Creatinine, Ser: 0.85 mg/dL (ref 0.44–1.00)
GFR, Estimated: >60 mL/min (ref >60)
Glucose, Bld: 137 mg/dL — ABNORMAL HIGH (ref 70–99)
Potassium: 3.4 mmol/L — ABNORMAL LOW (ref 3.5–5.1)
Sodium: 143 mmol/L (ref 135–145)

## 2021-10-26 LAB — RESP PANEL BY RT-PCR (FLU A&B, COVID) ARPGX2
Influenza A by PCR: NEGATIVE
Influenza B by PCR: NEGATIVE
SARS Coronavirus 2 by RT PCR: NEGATIVE

## 2021-10-26 LAB — PROTIME-INR
INR: 1.3 — ABNORMAL HIGH (ref 0.8–1.2)
Prothrombin Time: 16.3 seconds — ABNORMAL HIGH (ref 11.4–15.2)

## 2021-10-26 LAB — LACTIC ACID, PLASMA: Lactic Acid, Venous: 1.6 mmol/L (ref 0.5–1.9)

## 2021-10-26 LAB — TYPE AND SCREEN
ABO/RH(D): O POS
Antibody Screen: NEGATIVE

## 2021-10-26 LAB — TROPONIN I (HIGH SENSITIVITY)
Troponin I (High Sensitivity): 31 ng/L — ABNORMAL HIGH (ref <18)
Troponin I (High Sensitivity): 29 ng/L — ABNORMAL HIGH (ref <18)

## 2021-10-26 LAB — LACTATE DEHYDROGENASE: LDH: 402 U/L — ABNORMAL HIGH (ref 98–192)

## 2021-10-26 LAB — AMMONIA: Ammonia: 16 umol/L (ref 9–35)

## 2021-10-26 LAB — HIV ANTIBODY (ROUTINE TESTING W REFLEX): HIV Screen 4th Generation wRfx: NONREACTIVE

## 2021-10-26 MED ORDER — CARVEDILOL 3.125 MG PO TABS: 3.1250 mg | ORAL_TABLET | Freq: Two times a day (BID) | ORAL | Status: DC

## 2021-10-26 MED ORDER — POTASSIUM CHLORIDE CRYS ER 20 MEQ PO TBCR
40.0000 meq | EXTENDED_RELEASE_TABLET | Freq: Once | ORAL | Status: AC
  Administered 2021-10-26: 40 meq via ORAL
  Filled 2021-10-26: qty 2

## 2021-10-26 MED ORDER — LORAZEPAM 1 MG PO TABS
1.0000 mg | ORAL_TABLET | ORAL | Status: AC | PRN
  Administered 2021-10-27: 1 mg via ORAL
  Filled 2021-10-26: qty 1

## 2021-10-26 MED ORDER — LORAZEPAM 2 MG/ML IJ SOLN
1.0000 mg | INTRAMUSCULAR | Status: AC | PRN
  Administered 2021-10-26: 1 mg via INTRAVENOUS
  Filled 2021-10-26: qty 1

## 2021-10-26 MED ORDER — ALBUTEROL SULFATE HFA 108 (90 BASE) MCG/ACT IN AERS: 2.0000 | INHALATION_SPRAY | RESPIRATORY_TRACT | Status: DC | PRN

## 2021-10-26 MED ORDER — THIAMINE HCL 100 MG/ML IJ SOLN
100.0000 mg | Freq: Every day | INTRAMUSCULAR | Status: DC
  Filled 2021-10-26: qty 2

## 2021-10-26 MED ORDER — THIAMINE HCL 100 MG PO TABS
100.0000 mg | ORAL_TABLET | Freq: Every day | ORAL | Status: DC
  Administered 2021-10-26 – 2021-10-30 (×5): 100 mg via ORAL
  Filled 2021-10-26 (×5): qty 1

## 2021-10-26 MED ORDER — FOLIC ACID 1 MG PO TABS
1.0000 mg | ORAL_TABLET | Freq: Every day | ORAL | Status: DC
  Administered 2021-10-26 – 2021-10-30 (×5): 1 mg via ORAL
  Filled 2021-10-26 (×5): qty 1

## 2021-10-26 MED ORDER — HYDRALAZINE HCL 20 MG/ML IJ SOLN
10.0000 mg | Freq: Once | INTRAMUSCULAR | Status: AC
  Administered 2021-10-26: 10 mg via INTRAVENOUS
  Filled 2021-10-26: qty 1

## 2021-10-26 MED ORDER — ADULT MULTIVITAMIN W/MINERALS CH
1.0000 | ORAL_TABLET | Freq: Every day | ORAL | Status: DC
  Administered 2021-10-26 – 2021-10-30 (×5): 1 via ORAL
  Filled 2021-10-26 (×5): qty 1

## 2021-10-26 MED ORDER — SODIUM CHLORIDE 0.9% FLUSH
3.0000 mL | Freq: Two times a day (BID) | INTRAVENOUS | Status: DC
  Administered 2021-10-27 – 2021-10-30 (×6): 3 mL via INTRAVENOUS

## 2021-10-26 MED ORDER — LISINOPRIL 10 MG PO TABS
10.0000 mg | ORAL_TABLET | Freq: Every day | ORAL | Status: DC
  Administered 2021-10-26 – 2021-10-27 (×2): 10 mg via ORAL
  Filled 2021-10-26 (×2): qty 1

## 2021-10-26 MED ORDER — ACETAMINOPHEN 325 MG PO TABS
650.0000 mg | ORAL_TABLET | ORAL | Status: DC | PRN
  Administered 2021-10-27 – 2021-10-29 (×4): 650 mg via ORAL
  Filled 2021-10-26 (×4): qty 2

## 2021-10-26 MED ORDER — ONDANSETRON HCL 4 MG/2ML IJ SOLN: 4.0000 mg | Freq: Four times a day (QID) | INTRAMUSCULAR | Status: DC | PRN

## 2021-10-26 MED ORDER — SODIUM CHLORIDE 0.9% FLUSH: 3.0000 mL | INTRAVENOUS | Status: DC | PRN

## 2021-10-26 MED ORDER — ENOXAPARIN SODIUM 40 MG/0.4ML IJ SOSY: 40.0000 mg | PREFILLED_SYRINGE | INTRAMUSCULAR | Status: DC

## 2021-10-26 MED ORDER — ALBUTEROL SULFATE (2.5 MG/3ML) 0.083% IN NEBU: 2.5000 mg | INHALATION_SOLUTION | RESPIRATORY_TRACT | Status: DC | PRN

## 2021-10-26 MED ORDER — CARVEDILOL 6.25 MG PO TABS
6.2500 mg | ORAL_TABLET | Freq: Two times a day (BID) | ORAL | Status: DC
  Administered 2021-10-26 – 2021-10-30 (×8): 6.25 mg via ORAL
  Filled 2021-10-26 (×3): qty 1
  Filled 2021-10-26: qty 2
  Filled 2021-10-26 (×4): qty 1

## 2021-10-26 MED ORDER — SPIRONOLACTONE 100 MG PO TABS
100.0000 mg | ORAL_TABLET | Freq: Every day | ORAL | Status: DC
  Administered 2021-10-26 – 2021-10-30 (×5): 100 mg via ORAL
  Filled 2021-10-26 (×6): qty 1

## 2021-10-26 MED ORDER — FUROSEMIDE 10 MG/ML IJ SOLN
40.0000 mg | Freq: Two times a day (BID) | INTRAMUSCULAR | Status: DC
  Administered 2021-10-26 – 2021-10-29 (×6): 40 mg via INTRAVENOUS
  Filled 2021-10-26 (×6): qty 4

## 2021-10-26 MED ORDER — IOHEXOL 300 MG/ML  SOLN
100.0000 mL | Freq: Once | INTRAMUSCULAR | Status: AC | PRN
  Administered 2021-10-26: 100 mL via INTRAVENOUS

## 2021-10-26 MED ORDER — SODIUM CHLORIDE 0.9 % IV SOLN
250.0000 mL | INTRAVENOUS | Status: DC | PRN
Start: 2021-10-26 — End: 2021-10-30

## 2021-10-26 NOTE — ED Provider Notes (Addendum)
?[place] ? ? ? ?CSN: [id number] ?Arrival date & time: 10/26/21  [id number] ? ? ?  ? ?History   ?Chief Complaint ?No chief complaint on file. ? ? ?HPI ?Mary Anderson is a 56 y.o. female.  She reports increasing shortness of breath for the last 2 days.  She was seen in this urgent care on 10/20/2021 and diagnosed with right lower pneumonia.  She has been taking her doxycycline as prescribed.  She has finished the prednisone that was prescribed.  Shortness of breath just started 2 days ago.  She denies fever or chills.  She denies wheezing.  She denies peripheral edema.  Does have a history of hypertension but is not currently taking any blood pressure medicine.   ? ?HPI ? ?Past Medical History:  ?Diagnosis Date  ? Hypertension   ? ? ?Patient Active Problem List  ? Diagnosis Date Noted  ? Cirrhosis ([place]) 09/11/2017  ? Splenomegaly 09/11/2017  ? Thrombocytopenia ([place]) 09/11/2017  ? Calculus of gallbladder without cholecystitis without obstruction 09/10/2017  ? Chronic hepatitis C ([place]) 08/30/2017  ? Hypertension 07/31/2017  ? ? ?History reviewed. No pertinent surgical history. ? ?OB History   ?No obstetric history on file. ?  ? ? ? ?Home Medications   ? ?Prior to Admission medications   ?Medication Sig Start Date End Date Taking? Authorizing Provider  ?doxycycline (VIBRAMYCIN) 100 MG capsule Take 1 capsule (100 mg total) by mouth 2 (two) times daily. 10/20/21   Scot Jun, FNP  ?hydrochlorothiazide (HYDRODIURIL) 12.5 MG tablet TAKE 1 TABLET BY MOUTH EVERY DAY 07/28/18   Welford Roche, MD  ?ibuprofen (ADVIL,MOTRIN) 200 MG tablet Take 200-400 mg by mouth every 6 (six) hours as needed for mild pain.     [provider]  ?lisinopril (PRINIVIL,ZESTRIL) 20 MG tablet TAKE 1 TABLET BY MOUTH EVERY DAY 07/28/18   Welford Roche, MD  ?predniSONE (DELTASONE) 20 MG tablet Take 1 tablet (20 mg total) by mouth daily with breakfast. 10/20/21   Scot Jun, FNP  ?promethazine-dextromethorphan  (PROMETHAZINE-DM) 6.25-15 MG/5ML syrup Take 5 mLs by mouth 3 (three) times daily as needed for cough. 10/20/21   Scot Jun, FNP  ?Sofosbuvir-Velpatasvir (EPCLUSA) 400-100 MG TABS Take 1 tablet by mouth daily with breakfast. 12/02/17   Dinah Beers, RPH-CPP  ? ? ?Family History ?Family History  ?Problem Relation Age of Onset  ? Hypertension Mother   ? ? ?Social History ?Social History  ? ?Tobacco Use  ? Smoking status: Never  ? Smokeless tobacco: Never  ?Substance Use Topics  ? Alcohol use: Yes  ? Drug use: No  ? ? ? ?Allergies   ?Patient has no known allergies. ? ? ?Review of Systems ?Review of Systems ? ? ?Physical Exam ?Triage Vital Signs ?ED Triage Vitals  ?Enc Vitals Group  ?   BP 10/26/21 0940 (!) 219/100  ?   Pulse Rate 10/26/21 0940 76  ?   Resp 10/26/21 0940 (!) 22  ?   Temp 10/26/21 0940 98.3 ?F (36.8 ?C)  ?   Temp Source 10/26/21 0940 Oral  ?   SpO2 10/26/21 0940 97 %  ?   Weight --   ?   Height --   ?   Head Circumference --   ?   Peak Flow --   ?   Pain Score 10/26/21 0939 8  ?   Pain Loc --   ?   Pain Edu? --   ?   Excl. in [place]? --   ? ?  No data found. ? ?Updated Vital Signs ?BP (!) 219/100 (BP Location: Right Arm) Comment: no longer taking HTN medications  Pulse 76   Temp 98.3 ?F (36.8 ?C) (Oral)   Resp (!) 22   LMP 04/22/2011   SpO2 97%  ? ?Visual Acuity ?Right Eye Distance:   ?Left Eye Distance:   ?Bilateral Distance:   ? ?Right Eye Near:   ?Left Eye Near:    ?Bilateral Near:    ? ?Physical Exam ?Constitutional:   ?   General: She is not in acute distress. ?   Appearance: Normal appearance. She is not ill-appearing.  ?Cardiovascular:  ?   Rate and Rhythm: Normal rate and regular rhythm.  ?   Comments: Bilateral lower extremity pitting edema up to her knees ?Pulmonary:  ?   Breath sounds: Decreased air movement present. Examination of the right-upper field reveals decreased breath sounds. Examination of the right-middle field reveals decreased breath sounds. Examination of the right-lower  field reveals decreased breath sounds. Decreased breath sounds present. No wheezing or rhonchi.  ?   Comments: Patient can only speak a few words before needing to take a breath. ?Musculoskeletal:  ?   Right lower leg: 3+ Edema present.  ?   Left lower leg: 3+ Edema present.  ?Neurological:  ?   Mental Status: She is alert.  ? ? ? ?UC Treatments / Results  ?Labs ?(all labs ordered are listed, but only abnormal results are displayed) ?Labs Reviewed - No data to display ? ?EKG ? ? ?Radiology ?DG Chest 2 View ? ?Result Date: 10/26/2021 ?CLINICAL DATA:  Shortness of breath, recent diagnosis of pneumonia. EXAM: CHEST - 2 VIEW COMPARISON:  October 20, 2021 FINDINGS: The heart size and mediastinal contours are partially obscured but appear unchanged and within normal limits. Increased size of the large right pleural effusion with adjacent consolidation. Possible minimally displaced fracture of the right scapula. IMPRESSION: Increased size of the large right pleural effusion with adjacent consolidation, differential consideration does include pneumonia with parapneumonic effusion. However suggest further evaluation with chest CT to assess for alternate etiologies of the findings including malignancy or trauma given patient's recent history of fall and possible minimally displaced right scapular fracture. Electronically Signed   By: Dahlia Bailiff M.D.   On: 10/26/2021 10:58   ? ?Procedures ?Procedures (including critical care time) ? ?Medications Ordered in UC ?Medications - No data to display ? ?Initial Impression / Assessment and Plan / UC Course  ?I have reviewed the triage vital signs and the nursing notes. ? ?Pertinent labs & imaging results that were available during my care of the patient were reviewed by me and considered in my medical decision making (see chart for details). ? ?  ?Reviewed CXR results with pt. She has called her husband to transport her to ED for further care.  ? ?Final Clinical Impressions(s) / UC  Diagnoses  ? ?Final diagnoses:  ?Dyspnea, unspecified type  ?3+ pitting edema  ?Pleural effusion  ?Hypertension, unspecified type  ? ? ? ?Discharge Instructions   ? ?  ?Please go directly to ER for further care.  ? ? ? ? ?ED Prescriptions   ?None ?  ? ?PDMP not reviewed this encounter. ?  ?Carvel Getting, NP ?10/26/21 1120 ? ?  ?Carvel Getting, NP ?10/26/21 1131 ? ?

## 2021-10-26 NOTE — ED Triage Notes (Signed)
Pt reports was seen Friday here for PNA. Pt reports that tried to go to work this morning for the first time but gets SOB with any type of exertion.  ?

## 2021-10-26 NOTE — ED Notes (Signed)
Patient reports that she would like to ambulate to the restroom and feels comfortable doing so instead of the purewick she was educated on  ?

## 2021-10-26 NOTE — H&P (Signed)
?History and Physical  ? ? ?Mary Oysterammy C Routson WJX:[id number] DOB: 04/01/1966 DOA: 10/26/2021 ? ?PCP: Pcp, No (Confirm with patient/family/NH records and if not entered, [facility]) ?Patient coming from: [facility] ? ?I have personally briefly reviewed patient's [facility] ? ?Chief Complaint: SOB, swelling ? ?HPI: Mary Anderson is a 56 y.o. 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 ? ?Patient had mechanical fall about 10 days ago, when she fell on her right side.  Since then she has developed right-sided rib cage pain SOS increasing shortness of breath and exploiting.  No cough, no wheezing, no fever or chills.  Pain is localized right rib cage, worsening with deep breath or upper body movement.  She has been taking NSAIDs with some relief.  Then, 6 days ago, she went to urgent care, when she was told she had a pneumonia and started on doxycycline.  Her shortness of breath symptoms has not improved after completion of 7 days course of doxycycline and decided to come in.  She again denies any cough, no fever or chills. ? ?ED Course: Blood pressure significantly high with SBP> 200.  Patient denies any chest pain or headache.  Chest x-ray showed large right-sided pleural effusion.  CT confirmed large right-sided pleural effusion, liver cirrhosis. ? ?Blood work WBC 3.9, K3.4, creatinine 0.8, AST 90, ALT 75, Ruben 3.3. ? ?Review of Systems: As per HPI otherwise 14 point review of systems negative.  ? ? ?Past Medical History:  ?Diagnosis Date  ? Hypertension   ? ? ?History reviewed. No pertinent surgical history. ? ? reports that she has never smoked. She has never used smokeless tobacco. She reports current alcohol use. She reports that she does not use  drugs. ? ?No Known Allergies ? ?Family History  ?Problem Relation Age of Onset  ? Hypertension Mother   ? ? ? ?Prior to Admission medications   ?Medication Sig Start Date End Date Taking? Authorizing Provider  ?acetaminophen (TYLENOL) 500 MG tablet Take 500 mg by mouth daily.   Yes [provider]  ?doxycycline (VIBRAMYCIN) 100 MG capsule Take 1 capsule (100 mg total) by mouth 2 (two) times daily. 10/20/21  Yes Bing NeighborsHarris, Kimberly S, FNP  ?ibuprofen (ADVIL,MOTRIN) 200 MG tablet Take 200-400 mg by mouth daily as needed (pain).   Yes [provider]  ?predniSONE (DELTASONE) 20 MG tablet Take 1 tablet (20 mg total) by mouth daily with breakfast. 10/20/21  Yes Bing NeighborsHarris, Kimberly S, FNP  ?promethazine-dextromethorphan (PROMETHAZINE-DM) 6.25-15 MG/5ML syrup Take 5 mLs by mouth 3 (three) times daily as needed for cough. 10/20/21  Yes Bing NeighborsHarris, Kimberly S, FNP  ?hydrochlorothiazide (HYDRODIURIL) 12.5 MG tablet TAKE 1 TABLET BY MOUTH EVERY DAY ?Patient not taking: Reported on 10/26/2021 07/28/18   Burna CashSantos-Sanchez, Idalys, MD  ?lisinopril (PRINIVIL,ZESTRIL) 20 MG tablet TAKE 1 TABLET BY MOUTH EVERY DAY ?Patient not taking: Reported on 10/26/2021 07/28/18   Burna CashSantos-Sanchez, Idalys, MD  ?Sofosbuvir-Velpatasvir (EPCLUSA) 400-100 MG TABS Take 1 tablet by mouth daily with breakfast. ?Patient not taking: Reported on 10/26/2021 12/02/17   Nicholes CalamityPham, Minh Q, RPH-CPP  ? ? ?Physical Exam: ?Vitals:  ? 10/26/21 1430 10/26/21 1445 10/26/21 1500 10/26/21 1515  ?BP: (!) 198/95 (!) 192/95 (!) 189/80 (!) 180/102  ?Pulse: 87 91 83 85  ?Resp: (!) 23 17 (!)  21 (!) 24  ?Temp:      ?SpO2: 97% 98% 98% 96%  ?Weight:      ?Height:      ? ? ?Constitutional: NAD, calm, comfortable ?Vitals:  ? 10/26/21 1430 10/26/21 1445 10/26/21 1500 10/26/21 1515  ?BP: (!) 198/95 (!) 192/95 (!) 189/80 (!) 180/102  ?Pulse: 87 91 83 85  ?Resp: (!) 23 17 (!) 21 (!) 24  ?Temp:      ?SpO2: 97% 98% 98% 96%  ?Weight:      ?Height:      ? ?Eyes: PERRL, lids and conjunctivae  normal ?ENMT: Mucous membranes are moist. Posterior pharynx clear of any exudate or lesions.Normal dentition.  ?Neck: normal, supple, no masses, no thyromegaly ?Respiratory: Diminished breathing sounds on the right side, no wheezing, no crackles. Normal respiratory effort. No accessory muscle use.  ?Cardiovascular: Regular rate and rhythm, no murmurs / rubs / gallops. No extremity edema. 2+ pedal pulses. No carotid bruits.  ?Abdomen: no tenderness, no masses palpated. No hepatosplenomegaly. Bowel sounds positive.  Positive ascites sign ?Musculoskeletal: no clubbing / cyanosis. No joint deformity upper and lower extremities. Good ROM, no contractures. Normal muscle tone.  ?Skin: no rashes, lesions, ulcers. No induration ?Neurologic: CN 2-12 grossly intact. Sensation intact, DTR normal. Strength 5/5 in all 4.  ?Psychiatric: Normal judgment and insight. Alert and oriented x 3. Normal mood.  ? ? ? ?Labs on Admission: I have personally reviewed following labs and imaging studies ? ?CBC: ?Recent Labs  ?Lab 10/26/21 ?1152  ?WBC 6.9  ?HGB 13.6  ?HCT 41.0  ?MCV 108.2*  ?PLT 76*  ? ?Basic Metabolic Panel: ?Recent Labs  ?Lab 10/26/21 ?1152  ?NA 143  ?K 3.4*  ?CL 106  ?CO2 30  ?GLUCOSE 137*  ?BUN 14  ?CREATININE 0.85  ?CALCIUM 8.6*  ? ?GFR: ?Estimated Creatinine Clearance: 76.4 mL/min (by C-G formula based on SCr of 0.85 mg/dL). ?Liver Function Tests: ?Recent Labs  ?Lab 10/26/21 ?1309  ?AST 98*  ?ALT 75*  ?ALKPHOS 104  ?BILITOT 3.3*  ?PROT 7.5  ?ALBUMIN 2.8*  ? ?No results for input(s): LIPASE, AMYLASE in the last 168 hours. ?No results for input(s): AMMONIA in the last 168 hours. ?Coagulation Profile: ?Recent Labs  ?Lab 10/26/21 ?1309  ?INR 1.3*  ? ?Cardiac Enzymes: ?No results for input(s): CKTOTAL, CKMB, CKMBINDEX, TROPONINI in the last 168 hours. ?BNP (last 3 results) ?No results for input(s): PROBNP in the last 8760 hours. ?HbA1C: ?No results for input(s): HGBA1C in the last 72 hours. ?CBG: ?No results for input(s): GLUCAP  in the last 168 hours. ?Lipid Profile: ?No results for input(s): CHOL, HDL, LDLCALC, TRIG, CHOLHDL, LDLDIRECT in the last 72 hours. ?Thyroid Function Tests: ?No results for input(s): TSH, T4TOTAL, FREET4, T3FREE, THYROIDAB in the last 72 hours. ?Anemia Panel: ?No results for input(s): VITAMINB12, FOLATE, FERRITIN, TIBC, IRON, RETICCTPCT in the last 72 hours. ?Urine analysis: ?   ?Component Value Date/Time  ? COLORURINE AMBER (A) 08/29/2017 2300  ? APPEARANCEUR TURBID (A) 08/29/2017 2300  ? APPEARANCEUR Turbid (A) 08/29/2017 1527  ? LABSPEC 1.017 08/29/2017 2300  ? PHURINE 5.0 08/29/2017 2300  ? GLUCOSEU 50 (A) 08/29/2017 2300  ? HGBUR MODERATE (A) 08/29/2017 2300  ? BILIRUBINUR NEGATIVE 08/29/2017 2300  ? BILIRUBINUR Negative 08/29/2017 1532  ? BILIRUBINUR Negative 08/29/2017 1527  ? KETONESUR NEGATIVE 08/29/2017 2300  ? PROTEINUR 100 (A) 08/29/2017 2300  ? UROBILINOGEN 0.2 08/29/2017 1532  ? NITRITE NEGATIVE 08/29/2017 2300  ? LEUKOCYTESUR MODERATE (A) 08/29/2017 2300  ? LEUKOCYTESUR  3+ (A) 08/29/2017 1527  ? ? ?Radiological Exams on Admission: ?DG Chest 2 View ? ?Result Date: 10/26/2021 ?CLINICAL DATA:  Shortness of breath, recent diagnosis of pneumonia. EXAM: CHEST - 2 VIEW COMPARISON:  October 20, 2021 FINDINGS: The heart size and mediastinal contours are partially obscured but appear unchanged and within normal limits. Increased size of the large right pleural effusion with adjacent consolidation. Possible minimally displaced fracture of the right scapula. IMPRESSION: Increased size of the large right pleural effusion with adjacent consolidation, differential consideration does include pneumonia with parapneumonic effusion. However suggest further evaluation with chest CT to assess for alternate etiologies of the findings including malignancy or trauma given patient's recent history of fall and possible minimally displaced right scapular fracture. Electronically Signed   By: Maudry Mayhew M.D.   On: 10/26/2021  10:58  ? ?CT CHEST ABDOMEN PELVIS W CONTRAST ? ?Result Date: 10/26/2021 ?CLINICAL DATA:  Shortness of breath, polytrauma EXAM: CT CHEST, ABDOMEN, AND PELVIS WITH CONTRAST TECHNIQUE: Multidetector CT imaging of the

## 2021-10-26 NOTE — ED Notes (Signed)
Patient and family updated on the current plan of care and verbalize understating. Call bell in reach; patient ambulates to the bathroom at this time unassisted.  ?

## 2021-10-26 NOTE — ED Notes (Signed)
Patient swallows pills with ease patient verbalizes understanding of the plan of care.  Call bell in reach monitor is on and cycling ?

## 2021-10-26 NOTE — ED Provider Notes (Signed)
?[facility] ?Provider Note ? ? ?CSN: [id number] ?Arrival date & time: 10/26/21  1135 ? ?  ? ?History ? ?Chief Complaint  ?Patient presents with  ? Shortness of Breath  ? ? ?Mary Anderson is a 56 y.o. female.  She has a history of hypertension and cirrhosis.She had a mechanical fall about 10 days ago and struck her right lateral chest.  She was seen a few days after that at urgent care was told she had pneumonia and put on antibiotics.  She has had increased shortness of breath dyspnea on exertion along with continued pain of the right-sided chest wall.  She went back there today where they told her her pleural effusion has gotten bigger and that she needs to come to the emergency department.  She has had no fever and minimal cough.  She does have shortness of breath with exertion.  She also feels like she is more swollen in her legs and abdomen.  Denies abdominal pain.  Non-smoker and denies any recent alcohol ingestion.  Not on blood thinners. ? ?The history is provided by the patient.  ?Shortness of Breath ?Severity:  Moderate ?Onset quality:  Gradual ?Duration:  5 days ?Timing:  Intermittent ?Progression:  Worsening ?Chronicity:  New ?Relieved by:  Nothing ?Worsened by:  Activity ?Ineffective treatments:  Rest ?Associated symptoms: chest pain   ?Associated symptoms: no abdominal pain, no cough, no fever, no headaches, no hemoptysis, no neck pain, no rash, no sore throat and no sputum production   ?Risk factors: no tobacco use   ? ?  ? ?Home Medications ?Prior to Admission medications   ?Medication Sig Start Date End Date Taking? Authorizing Provider  ?doxycycline (VIBRAMYCIN) 100 MG capsule Take 1 capsule (100 mg total) by mouth 2 (two) times daily. 10/20/21   Bing NeighborsHarris, Kimberly S, FNP  ?hydrochlorothiazide (HYDRODIURIL) 12.5 MG tablet TAKE 1 TABLET BY MOUTH EVERY DAY 07/28/18   Burna CashSantos-Sanchez, Idalys, MD  ?ibuprofen (ADVIL,MOTRIN) 200 MG tablet Take 200-400 mg by mouth every 6  (six) hours as needed for mild pain.     [provider]  ?lisinopril (PRINIVIL,ZESTRIL) 20 MG tablet TAKE 1 TABLET BY MOUTH EVERY DAY 07/28/18   Burna CashSantos-Sanchez, Idalys, MD  ?predniSONE (DELTASONE) 20 MG tablet Take 1 tablet (20 mg total) by mouth daily with breakfast. 10/20/21   Bing NeighborsHarris, Kimberly S, FNP  ?promethazine-dextromethorphan (PROMETHAZINE-DM) 6.25-15 MG/5ML syrup Take 5 mLs by mouth 3 (three) times daily as needed for cough. 10/20/21   Bing NeighborsHarris, Kimberly S, FNP  ?Sofosbuvir-Velpatasvir (EPCLUSA) 400-100 MG TABS Take 1 tablet by mouth daily with breakfast. 12/02/17   Nicholes CalamityPham, Minh Q, RPH-CPP  ?   ? ?Allergies    ?Patient has no known allergies.   ? ?Review of Systems   ?Review of Systems  ?Constitutional:  Negative for fever.  ?HENT:  Negative for sore throat.   ?Respiratory:  Positive for shortness of breath. Negative for cough, hemoptysis and sputum production.   ?Cardiovascular:  Positive for chest pain and leg swelling.  ?Gastrointestinal:  Negative for abdominal pain.  ?Genitourinary:  Negative for dysuria.  ?Musculoskeletal:  Negative for neck pain.  ?Skin:  Negative for rash.  ?Neurological:  Negative for headaches.  ? ?Physical Exam ?Updated Vital Signs ?BP (!) 223/102   Pulse (!) 59   Temp 98.4 ?F (36.9 ?C)   Resp (!) 22   Ht 5\' 4"  (1.626 m)   Wt 81.6 kg   LMP 04/22/2011   SpO2 95%   BMI  30.88 kg/m?  ?Physical Exam ?Vitals and nursing note reviewed.  ?Constitutional:   ?   General: She is not in acute distress. ?   Appearance: She is well-developed.  ?HENT:  ?   Head: Normocephalic and atraumatic.  ?Eyes:  ?   Conjunctiva/sclera: Conjunctivae normal.  ?Cardiovascular:  ?   Rate and Rhythm: Normal rate and regular rhythm.  ?   Heart sounds: No murmur heard. ?Pulmonary:  ?   Effort: Pulmonary effort is normal. No respiratory distress.  ?   Breath sounds: Examination of the right-upper field reveals decreased breath sounds. Examination of the right-middle field reveals decreased breath sounds.  Examination of the right-lower field reveals decreased breath sounds. Decreased breath sounds present.  ?Chest:  ?   Chest wall: Tenderness (Right upper lateral chest) present.  ?Abdominal:  ?   Palpations: Abdomen is soft.  ?   Tenderness: There is no abdominal tenderness.  ?Musculoskeletal:     ?   General: No swelling.  ?   Cervical back: Neck supple.  ?   Right lower leg: No tenderness. Edema present.  ?   Left lower leg: No tenderness. Edema present.  ?Skin: ?   General: Skin is warm and dry.  ?   Capillary Refill: Capillary refill takes less than 2 seconds.  ?Neurological:  ?   General: No focal deficit present.  ?   Mental Status: She is alert and oriented to person, place, and time.  ?   Sensory: No sensory deficit.  ?   Motor: No weakness.  ? ? ?ED Results / Procedures / Treatments   ?Labs ?(all labs ordered are listed, but only abnormal results are displayed) ?Labs Reviewed  ?BASIC METABOLIC PANEL - Abnormal; Notable for the following components:  ?    Result Value  ? Potassium 3.4 (*)   ? Glucose, Bld 137 (*)   ? Calcium 8.6 (*)   ? All other components within normal limits  ?CBC - Abnormal; Notable for the following components:  ? RBC 3.79 (*)   ? MCV 108.2 (*)   ? MCH 35.9 (*)   ? Platelets 76 (*)   ? All other components within normal limits  ?PROTIME-INR - Abnormal; Notable for the following components:  ? Prothrombin Time 16.3 (*)   ? INR 1.3 (*)   ? All other components within normal limits  ?HEPATIC FUNCTION PANEL - Abnormal; Notable for the following components:  ? Albumin 2.8 (*)   ? AST 98 (*)   ? ALT 75 (*)   ? Total Bilirubin 3.3 (*)   ? Bilirubin, Direct 1.2 (*)   ? Indirect Bilirubin 2.1 (*)   ? All other components within normal limits  ?TROPONIN I (HIGH SENSITIVITY) - Abnormal; Notable for the following components:  ? Troponin I (High Sensitivity) 31 (*)   ? All other components within normal limits  ?TROPONIN I (HIGH SENSITIVITY) - Abnormal; Notable for the following components:  ?  Troponin I (High Sensitivity) 29 (*)   ? All other components within normal limits  ?RESP PANEL BY RT-PCR (FLU A&B, COVID) [id number]  ?CULTURE, BLOOD (ROUTINE X 2)  ?CULTURE, BLOOD (ROUTINE X 2)  ?LACTIC ACID, PLASMA  ?LACTIC ACID, PLASMA  ?LACTATE DEHYDROGENASE  ?HIV ANTIBODY (ROUTINE TESTING W REFLEX)  ?AMMONIA  ?AFP TUMOR MARKER  ?HEPATIC FUNCTION PANEL  ?BASIC METABOLIC PANEL  ?I-STAT BETA HCG BLOOD, ED ([facility], [facility], [facility] ONLY)  ?TYPE AND SCREEN  ? ? ?EKG ?EKG Interpretation ? ?Date/Time:  Thursday October 26 2021 11:39:34 EDT ?Ventricular Rate:  76 ?PR Interval:    ?QRS Duration: 84 ?QT Interval:  386 ?QTC Calculation: 434 ?R Axis:   -6 ?Text Interpretation: Sinus with PACs Cannot rule out Anterior infarct , age undetermined Abnormal ECG No previous ECGs available Confirmed by Meridee Score [phone]) on 10/26/2021 2:01:18 PM ? ?Radiology ?DG Chest 2 View ? ?Result Date: 10/26/2021 ?CLINICAL DATA:  Shortness of breath, recent diagnosis of pneumonia. EXAM: CHEST - 2 VIEW COMPARISON:  October 20, 2021 FINDINGS: The heart size and mediastinal contours are partially obscured but appear unchanged and within normal limits. Increased size of the large right pleural effusion with adjacent consolidation. Possible minimally displaced fracture of the right scapula. IMPRESSION: Increased size of the large right pleural effusion with adjacent consolidation, differential consideration does include pneumonia with parapneumonic effusion. However suggest further evaluation with chest CT to assess for alternate etiologies of the findings including malignancy or trauma given patient's recent history of fall and possible minimally displaced right scapular fracture. Electronically Signed   By: Maudry Mayhew M.D.   On: 10/26/2021 10:58  ? ?CT CHEST ABDOMEN PELVIS W CONTRAST ? ?Result Date: 10/26/2021 ?CLINICAL DATA:  Shortness of breath, polytrauma EXAM: CT CHEST, ABDOMEN, AND PELVIS WITH CONTRAST TECHNIQUE: Multidetector CT imaging of the chest,  abdomen and pelvis was performed following the standard protocol during bolus administration of intravenous contrast. RADIATION DOSE REDUCTION: This exam was performed according to the departmental dose-optimization p

## 2021-10-26 NOTE — ED Notes (Signed)
Pt. States that both lower extremities have been swelling up for about a week.  ?

## 2021-10-26 NOTE — ED Notes (Signed)
Pt. Has states that she has not taken her blood pressure medication for a few years (5-41yrs)  ?

## 2021-10-26 NOTE — ED Notes (Signed)
Patient is being discharged from the Urgent Care and sent to the Emergency Department via POV with family. Per Willeen Cass, NP, patient is in need of higher level of care due to SOB with exertion and bilat lower extremity edema. Patient is aware and verbalizes understanding of plan of care.  ?Vitals:  ? 10/26/21 0940  ?BP: (!) 219/100  ?Pulse: 76  ?Resp: (!) 22  ?Temp: 98.3 ?F (36.8 ?C)  ?SpO2: 97%  ?  ?

## 2021-10-26 NOTE — ED Triage Notes (Signed)
Pt here from UC, states that she was seen Friday and diagnosed with pneumonia. ShOB with exertion increased over the past 2 days. O2 sat 88% on RA upon arrival to the ED. Improved to 95% with rest and deep breathing.  ? ?Hx: HTN  ?

## 2021-10-26 NOTE — Discharge Instructions (Signed)
Please go directly to ER for further care.  ?

## 2021-10-27 ENCOUNTER — Inpatient Hospital Stay (HOSPITAL_COMMUNITY): Payer: Self-pay

## 2021-10-27 DIAGNOSIS — R7989 Other specified abnormal findings of blood chemistry: Secondary | ICD-10-CM

## 2021-10-27 DIAGNOSIS — I5031 Acute diastolic (congestive) heart failure: Secondary | ICD-10-CM

## 2021-10-27 DIAGNOSIS — J90 Pleural effusion, not elsewhere classified: Secondary | ICD-10-CM

## 2021-10-27 DIAGNOSIS — E8809 Other disorders of plasma-protein metabolism, not elsewhere classified: Secondary | ICD-10-CM

## 2021-10-27 DIAGNOSIS — K7031 Alcoholic cirrhosis of liver with ascites: Secondary | ICD-10-CM

## 2021-10-27 DIAGNOSIS — F1020 Alcohol dependence, uncomplicated: Secondary | ICD-10-CM

## 2021-10-27 DIAGNOSIS — B182 Chronic viral hepatitis C: Secondary | ICD-10-CM

## 2021-10-27 DIAGNOSIS — R601 Generalized edema: Secondary | ICD-10-CM

## 2021-10-27 DIAGNOSIS — F1021 Alcohol dependence, in remission: Secondary | ICD-10-CM

## 2021-10-27 HISTORY — PX: IR THORACENTESIS ASP PLEURAL SPACE W/IMG GUIDE: IMG5380

## 2021-10-27 HISTORY — PX: IR PARACENTESIS: IMG2679

## 2021-10-27 LAB — ECHOCARDIOGRAM COMPLETE
AR max vel: 1.64 cm2
AV Area VTI: 1.93 cm2
AV Area mean vel: 1.95 cm2
AV Mean grad: 9 mmHg
AV Peak grad: 24.4 mmHg
Ao pk vel: 2.47 m/s
Area-P 1/2: 3.83 cm2
Height: 64 in
S' Lateral: 3.1 cm
Weight: 2913.6 oz

## 2021-10-27 LAB — GRAM STAIN: Gram Stain: NONE SEEN

## 2021-10-27 LAB — GLUCOSE, PLEURAL OR PERITONEAL FLUID: Glucose, Fluid: 187 mg/dL

## 2021-10-27 LAB — HEPATIC FUNCTION PANEL
ALT: 57 U/L — ABNORMAL HIGH (ref 0–44)
AST: 68 U/L — ABNORMAL HIGH (ref 15–41)
Albumin: 2.3 g/dL — ABNORMAL LOW (ref 3.5–5.0)
Alkaline Phosphatase: 87 U/L (ref 38–126)
Bilirubin, Direct: 0.9 mg/dL — ABNORMAL HIGH (ref 0.0–0.2)
Indirect Bilirubin: 1.3 mg/dL — ABNORMAL HIGH (ref 0.3–0.9)
Total Bilirubin: 2.2 mg/dL — ABNORMAL HIGH (ref 0.3–1.2)
Total Protein: 6 g/dL — ABNORMAL LOW (ref 6.5–8.1)

## 2021-10-27 LAB — ALBUMIN, PLEURAL OR PERITONEAL FLUID
Albumin, Fluid: <1.5 g/dL
Albumin, Fluid: <1.5 g/dL

## 2021-10-27 LAB — BODY FLUID CELL COUNT WITH DIFFERENTIAL
Eos, Fluid: 0 %
Eos, Fluid: 0 %
Lymphs, Fluid: 54 %
Lymphs, Fluid: 54 %
Monocyte-Macrophage-Serous Fluid: 39 % — ABNORMAL LOW (ref 50–90)
Monocyte-Macrophage-Serous Fluid: 45 % — ABNORMAL LOW (ref 50–90)
Neutrophil Count, Fluid: 1 % (ref 0–25)
Neutrophil Count, Fluid: 7 % (ref 0–25)
Total Nucleated Cell Count, Fluid: 192 cu mm (ref 0–1000)
Total Nucleated Cell Count, Fluid: 278 cu mm (ref 0–1000)

## 2021-10-27 LAB — BASIC METABOLIC PANEL
Anion gap: 7 (ref 5–15)
BUN: 13 mg/dL (ref 6–20)
CO2: 24 mmol/L (ref 22–32)
Calcium: 8.2 mg/dL — ABNORMAL LOW (ref 8.9–10.3)
Chloride: 107 mmol/L (ref 98–111)
Creatinine, Ser: 0.66 mg/dL (ref 0.44–1.00)
GFR, Estimated: >60 mL/min (ref >60)
Glucose, Bld: 153 mg/dL — ABNORMAL HIGH (ref 70–99)
Potassium: 3.7 mmol/L (ref 3.5–5.1)
Sodium: 138 mmol/L (ref 135–145)

## 2021-10-27 LAB — LACTATE DEHYDROGENASE, PLEURAL OR PERITONEAL FLUID
LD, Fluid: 28 U/L — ABNORMAL HIGH (ref 3–23)
LD, Fluid: 47 U/L — ABNORMAL HIGH (ref 3–23)

## 2021-10-27 LAB — ABO/RH: ABO/RH(D): O POS

## 2021-10-27 MED ORDER — ALBUMIN HUMAN 25 % IV SOLN
25.0000 g | Freq: Once | INTRAVENOUS | Status: AC
  Administered 2021-10-27: 25 g via INTRAVENOUS
  Filled 2021-10-27: qty 100

## 2021-10-27 MED ORDER — LIDOCAINE HCL 1 % IJ SOLN
INTRAMUSCULAR | Status: AC
  Filled 2021-10-27: qty 20

## 2021-10-27 MED ORDER — HYDRALAZINE HCL 25 MG PO TABS
25.0000 mg | ORAL_TABLET | Freq: Three times a day (TID) | ORAL | Status: DC
  Administered 2021-10-27 – 2021-10-28 (×4): 25 mg via ORAL
  Filled 2021-10-27 (×4): qty 1

## 2021-10-27 NOTE — TOC CAGE-AID Note (Signed)
Transition of Care (TOC) - CAGE-AID Screening ? ? ?Patient Details  ?Name: Mary Anderson ?MRN: [id number] ?Date of Birth: 1966/04/15 ? ?Transition of Care (TOC) CM/SW Contact:    ?Kingsley Plan, RN ?Phone Number: ?10/27/2021, 10:09 AM ? ? ?Clinical Narrative: ? ? ? ?CAGE-AID Screening: ?Substance Abuse Screening unable to be completed due to: : Patient Refused (Patient declined at present) ? ?  ?  ?  ?  ?  ? ?Substance Abuse Education Offered: Yes (PAtient declined at present) ? ?  ? ? ? ? ? ? ?

## 2021-10-27 NOTE — Procedures (Signed)
PROCEDURE SUMMARY: ? ?Successful ultrasound guided paracentesis from the right upper quadrant.  ?Yielded 350 mL of clear, yellow fluid.  ?No immediate complications.  ?The patient tolerated the procedure well.  ? ?Specimen was sent for labs. ? ?EBL < 64mL ? ?If the patient eventually requires >/=2 paracenteses in a 30 day period, screening evaluation by the [facility] will be assessed. ? ? ?Loyce Dys, MS RD PA-C ? ? ? ?

## 2021-10-27 NOTE — Progress Notes (Signed)
Heart Failure Navigator Progress Note ? ?Assessed for Heart & Vascular TOC clinic readiness.  ?Patient does not meet criteria due to no benefit at this time..  ? ? ? ?Shandale Malak, BSN, RN ?Heart Failure Nurse Navigator ?Secure Chat Only   ?

## 2021-10-27 NOTE — Progress Notes (Signed)
Offered ETOH resources, patient declined states she is going to stop ETOH   ?

## 2021-10-27 NOTE — Procedures (Signed)
PROCEDURE SUMMARY: ? ?Successful US guided right diagnostic and therapeutic thoracentesis. ?Yielded 2.0 liters of clear, yellow fluid. ?Pt tolerated procedure well. ?No immediate complications. ? ?Specimen was sent for labs. ?CXR ordered. ? ?EBL < 5 mL ? ?Docia Barrier PA-C ?10/27/2021 ?4:15 PM ? ? ? ?

## 2021-10-27 NOTE — TOC Progression Note (Addendum)
Transition of Care (TOC) - Progression Note  ? ? ?Patient Details  ?Name: Mary Anderson ?MRN: [id number] ?Date of Birth: 13-Mar-1966 ? ?Transition of Care (TOC) CM/SW Contact  ?Kingsley Plan, RN ?Phone Number: ?10/27/2021, 8:37 AM ? ?Clinical Narrative:    ? ?Scheduled hospital follow up appointment with Georganna Skeans Nov 09, 2021 at 2:40 pm. Information placed on AVS.  ? ?NCM changed pharmacy to [facility]. Once discharge scripts determined and sent to [facility], [facility] will call patient with cost . If patient unable to afford , will see if patient eligible for MATCH. Patient can only use MATCH once a year.  ? ?Prescriptions post discharge can be filled at [facility]  ? ?Discussed above with Patient. Patient voiced understanding  ? ?Confirmed face sheet information. Patient from home with husband  ? ?Offered ETOH resources , patient declined, states she is going to stop ETOH  ? ?Financial Counselor referral called.   ? ?Expected Discharge Plan: Home/Self Care ?Barriers to Discharge: Continued Medical Work up ? ?Expected Discharge Plan and Services ?Expected Discharge Plan: Home/Self Care ?  ?  ?  ?  ?                ?  ?  ?  ?  ?  ?  ?  ?  ?  ?  ? ? ?Social Determinants of Health (SDOH) Interventions ?  ? ?Readmission Risk Interventions ?   ? View : No data to display.  ?  ?  ?  ? ? ?

## 2021-10-27 NOTE — Progress Notes (Signed)
?PROGRESS NOTE ? ?ANSLIE SPADAFORA IZT:[id number] DOB: [birth year] DOA: 10/26/2021 ?PCP: Pcp, No ? ? LOS: 1 day  ? ?Brief Narrative / Interim history: ?This is a 56 year old female with history of essential hypertension, on noncompliant with medication and has not been taking any prescriptions for the past year, lost to follow-up for the past 4 years, used to be seen in IM clinic here at [facility], has cirrhosis secondary to hep C as well as alcohol abuse, comes into the hospital with increasing shortness of breath, abdominal and leg swelling.  This is been going on for quite some time, but over the last couple weeks she has been getting worse to the point that she is barely able to get around.  She tells me she stopped drinking alcohol altogether about 2 weeks ago.  She went to urgent care about a week ago, was told that she has pneumonia and was given doxycycline.  Given lack of improvement she presented to the ED and was admitted.  She was found to be significantly hypertensive in the 200s systolic as well as have profuse anasarca along with a right-sided pleural effusion. ? ?Subjective / 24h Interval events: ?She is doing well this morning.  Feels like her breathing is improved some.  Denies any chest pain.  No nausea or vomiting. ? ?Assesement and Plan: ?Principal Problem: ?  Cirrhosis (HCC) ?Active Problems: ?  Hypertension ?  Chronic hepatitis C (HCC) ?  Thrombocytopenia (HCC) ?  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  Anasarca ?  Pleural effusion on right ?  Elevated LFTs ?  Hypoalbuminemia ?  EtOH dependence (HCC) ? ? ?Principal problem ?Hypertensive urgency-blood pressure still quite elevated this morning, add hydralazine to her regimen, continue carvedilol and lisinopril.  Likely in the setting of noncompliance with home medications.  Will optimize regimen prior to discharge ? ?Active problems ?Liver cirrhosis, hepatitis C, chronic EtOH use, anasarca-this appears to be decompensated  with ascites, right-sided pleural effusion as well as anasarca.  A 2D echo done shows normal EF 60-65%, grade 1 diastolic dysfunction.  RV was normal.  She was placed on furosemide, continue.  We will give albumin x1 today to assist with oncotic pressure.  Obtain thoracentesis and paracentesis per IR ? ?Elevated LFTs-due to # her liver disease, improving today ? ?Thrombocytopenia-due to liver disease, no bleeding, monitor platelets ? ?Scheduled Meds: ? carvedilol  6.25 mg Oral BID WC  ? folic acid  1 mg Oral Daily  ? furosemide  40 mg Intravenous Q12H  ? hydrALAZINE  25 mg Oral Q8H  ? lisinopril  10 mg Oral Daily  ? multivitamin with minerals  1 tablet Oral Daily  ? sodium chloride flush  3 mL Intravenous Q12H  ? spironolactone  100 mg Oral Daily  ? thiamine  100 mg Oral Daily  ? Or  ? thiamine  100 mg Intravenous Daily  ? ?Continuous Infusions: ? sodium chloride    ? ?PRN Meds:.sodium chloride, acetaminophen, albuterol, LORazepam **OR** LORazepam, ondansetron (ZOFRAN) IV, sodium chloride flush ? ?Diet Orders (From admission, onward)  ? ?  Start     Ordered  ? 10/26/21 1559  Diet Heart Room service appropriate? Yes; Fluid consistency: Thin; Fluid restriction: 1800 mL Fluid  Diet effective now       ?Question Answer Comment  ?Room service appropriate? Yes   ?Fluid consistency: Thin   ?Fluid restriction: 1800 mL Fluid   ?  ? 10/26/21 1600  ? ?  ?  ? ?  ? ? ?  DVT prophylaxis: Foot Pump Start: 10/26/21 1622 ? ? ?Lab Results  ?Component Value Date  ? PLT 76 (L) 10/26/2021  ? ? ?  Code Status: Full Code ? ?Family Communication: No family at bedside ? ?Status is: Inpatient ? ?Remains inpatient appropriate because: Poorly controlled blood pressure, severe anasarca and decompensated liver disease ? ? ?Level of care: Telemetry Medical ? ?Consultants:  ?none ? ?Procedures:  ?2D echo ? ?Microbiology  ?none ? ?Antimicrobials: ?none  ? ? ?Objective: ?Vitals:  ? 10/27/21 0000 10/27/21 0403 10/27/21 0505 10/27/21 1016  ?BP:   (!)  190/98 (!) 146/73  ?Pulse:   79 82  ?Resp:   17 20  ?Temp:   98.3 ?F (36.8 ?C) 98.3 ?F (36.8 ?C)  ?TempSrc:   Oral Oral  ?SpO2:   92% 93%  ?Weight: 82.6 kg 82.6 kg    ?Height:      ? ? ?Intake/Output Summary (Last 24 hours) at 10/27/2021 1114 ?Last data filed at 10/27/2021 [id number] ?Gross per 24 hour  ?Intake 360 ml  ?Output 1300 ml  ?Net -940 ml  ? ?Wt Readings from Last 3 Encounters:  ?10/27/21 82.6 kg  ?09/11/17 81.6 kg  ?09/10/17 82.3 kg  ? ? ?Examination: ? ?Constitutional: NAD ?Eyes: no scleral icterus ?ENMT: Mucous membranes are moist.  ?Neck: normal, supple ?Respiratory: Diminished at the bases, no wheezing.  Comfortable at rest ?Cardiovascular: Regular rate and rhythm, no murmurs / rubs / gallops.  2+ pitting lower extremity edema ?Abdomen: non distended, no tenderness. Bowel sounds positive.  ?Musculoskeletal: no clubbing / cyanosis.  ?Skin: no rashes, chronic venous stasis changes bilateral lower extremities ?Neurologic: Nonfocal ? ? ?Data Reviewed: I have independently reviewed following labs and imaging studies  ? ?CBC ?Recent Labs  ?Lab 10/26/21 ?1152  ?WBC 6.9  ?HGB 13.6  ?HCT 41.0  ?PLT 76*  ?MCV 108.2*  ?MCH 35.9*  ?MCHC 33.2  ?RDW 14.6  ? ? ?Recent Labs  ?Lab 10/26/21 ?1152 10/26/21 ?1309 10/26/21 ?1630 10/27/21 ?0101  ?NA 143  --   --  138  ?K 3.4*  --   --  3.7  ?CL 106  --   --  107  ?CO2 30  --   --  24  ?GLUCOSE 137*  --   --  153*  ?BUN 14  --   --  13  ?CREATININE 0.85  --   --  0.66  ?CALCIUM 8.6*  --   --  8.2*  ?AST  --  98*  --  68*  ?ALT  --  75*  --  57*  ?ALKPHOS  --  104  --  87  ?BILITOT  --  3.3*  --  2.2*  ?ALBUMIN  --  2.8*  --  2.3*  ?LATICACIDVEN  --  1.6  --   --   ?INR  --  1.3*  --   --   ?AMMONIA  --   --  16  --   ? ? ?------------------------------------------------------------------------------------------------------------------ ?No results for input(s): CHOL, HDL, LDLCALC, TRIG, CHOLHDL, LDLDIRECT in the last 72 hours. ? ?No results found for:  HGBA1C ?------------------------------------------------------------------------------------------------------------------ ?No results for input(s): TSH, T4TOTAL, T3FREE, THYROIDAB in the last 72 hours. ? ?Invalid input(s): FREET3 ? ?Cardiac Enzymes ?No results for input(s): CKMB, TROPONINI, MYOGLOBIN in the last 168 hours. ? ?Invalid input(s): CK ?------------------------------------------------------------------------------------------------------------------ ?No results found for: BNP ? ?CBG: ?No results for input(s): GLUCAP in the last 168 hours. ? ?Recent Results (from the past 240 hour(s))  ?Culture, blood (  routine x 2)     Status: None (Preliminary result)  ? Collection Time: 10/26/21 12:36 PM  ? Specimen: BLOOD  ?Result Value Ref Range Status  ? Specimen Description BLOOD LEFT ANTECUBITAL  Final  ? Special Requests   Final  ?  BOTTLES DRAWN AEROBIC AND ANAEROBIC Blood Culture results may not be optimal due to an excessive volume of blood received in culture bottles  ? Culture   Final  ?  NO GROWTH < 24 HOURS ?Performed at [facility], [address] [place], [place] [address] ?  ? Report Status PENDING  Incomplete  ?Resp Panel by RT-PCR (Flu A&B, Covid) Nasopharyngeal Swab     Status: None  ? Collection Time: 10/26/21 12:36 PM  ? Specimen: Nasopharyngeal Swab; Nasopharyngeal(NP) swabs in vial transport medium  ?Result Value Ref Range Status  ? SARS Coronavirus 2 by RT PCR NEGATIVE NEGATIVE Final  ?  Comment: (NOTE) ?SARS-CoV-2 target nucleic acids are NOT DETECTED. ? ?The SARS-CoV-2 RNA is generally detectable in upper respiratory ?specimens during the acute phase of infection. The lowest ?concentration of SARS-CoV-2 viral copies this assay can detect is ?138 copies/mL. A negative result does not preclude SARS-Cov-2 ?infection and should not be used as the sole basis for treatment or ?other patient management decisions. A negative result may occur with  ?improper specimen collection/handling,  submission of specimen other ?than nasopharyngeal swab, presence of viral mutation(s) within the ?areas targeted by this assay, and inadequate number of viral ?copies(<138 copies/mL). A negative result must be combined with ?clinical observa

## 2021-10-27 NOTE — Progress Notes (Signed)
Echocardiogram ?2D Echocardiogram has been performed. ? ?Mary Anderson ?10/27/2021, 8:45 AM ?

## 2021-10-28 LAB — COMPREHENSIVE METABOLIC PANEL
ALT: 46 U/L — ABNORMAL HIGH (ref 0–44)
AST: 57 U/L — ABNORMAL HIGH (ref 15–41)
Albumin: 2.3 g/dL — ABNORMAL LOW (ref 3.5–5.0)
Alkaline Phosphatase: 75 U/L (ref 38–126)
Anion gap: 4 — ABNORMAL LOW (ref 5–15)
BUN: 14 mg/dL (ref 6–20)
CO2: 30 mmol/L (ref 22–32)
Calcium: 8.3 mg/dL — ABNORMAL LOW (ref 8.9–10.3)
Chloride: 105 mmol/L (ref 98–111)
Creatinine, Ser: 0.9 mg/dL (ref 0.44–1.00)
GFR, Estimated: >60 mL/min (ref >60)
Glucose, Bld: 195 mg/dL — ABNORMAL HIGH (ref 70–99)
Potassium: 3.3 mmol/L — ABNORMAL LOW (ref 3.5–5.1)
Sodium: 139 mmol/L (ref 135–145)
Total Bilirubin: 1.6 mg/dL — ABNORMAL HIGH (ref 0.3–1.2)
Total Protein: 5.3 g/dL — ABNORMAL LOW (ref 6.5–8.1)

## 2021-10-28 LAB — CBC
HCT: 30.8 % — ABNORMAL LOW (ref 36.0–46.0)
Hemoglobin: 10.9 g/dL — ABNORMAL LOW (ref 12.0–15.0)
MCH: 37.3 pg — ABNORMAL HIGH (ref 26.0–34.0)
MCHC: 35.4 g/dL (ref 30.0–36.0)
MCV: 105.5 fL — ABNORMAL HIGH (ref 80.0–100.0)
Platelets: 62 10*3/uL — ABNORMAL LOW (ref 150–400)
RBC: 2.92 MIL/uL — ABNORMAL LOW (ref 3.87–5.11)
RDW: 14.6 % (ref 11.5–15.5)
WBC: 4.9 10*3/uL (ref 4.0–10.5)
nRBC: 0 % (ref 0.0–0.2)

## 2021-10-28 LAB — AFP TUMOR MARKER: AFP, Serum, Tumor Marker: 9.6 ng/mL — ABNORMAL HIGH (ref 0.0–9.2)

## 2021-10-28 LAB — PHOSPHORUS: Phosphorus: 4.9 mg/dL — ABNORMAL HIGH (ref 2.5–4.6)

## 2021-10-28 LAB — MAGNESIUM: Magnesium: 1.8 mg/dL (ref 1.7–2.4)

## 2021-10-28 MED ORDER — ALBUMIN HUMAN 25 % IV SOLN
25.0000 g | Freq: Once | INTRAVENOUS | Status: AC
  Administered 2021-10-28: 25 g via INTRAVENOUS
  Filled 2021-10-28: qty 100

## 2021-10-28 MED ORDER — HYDRALAZINE HCL 25 MG PO TABS
25.0000 mg | ORAL_TABLET | Freq: Two times a day (BID) | ORAL | Status: DC
  Administered 2021-10-28 – 2021-10-30 (×4): 25 mg via ORAL
  Filled 2021-10-28 (×4): qty 1

## 2021-10-28 NOTE — Progress Notes (Signed)
?PROGRESS NOTE ? ?Mary Anderson OFB:[id number] DOB: 1965/08/04 DOA: 10/26/2021 ?PCP: Pcp, No ? ? LOS: 2 days  ? ?Brief Narrative / Interim history: ?This is a 56 year old female with history of essential hypertension, on noncompliant with medication and has not been taking any prescriptions for the past year, lost to follow-up for the past 4 years, used to be seen in IM clinic here at [facility], has cirrhosis secondary to hep C as well as alcohol abuse, comes into the hospital with increasing shortness of breath, abdominal and leg swelling.  This is been going on for quite some time, but over the last couple weeks she has been getting worse to the point that she is barely able to get around.  She tells me she stopped drinking alcohol altogether about 2 weeks ago.  She went to urgent care about a week ago, was told that she has pneumonia and was given doxycycline.  Given lack of improvement she presented to the ED and was admitted.  She was found to be significantly hypertensive in the 200s systolic as well as have profuse anasarca along with a right-sided pleural effusion. ? ?Subjective / 24h Interval events: ?She did not like the thoracentesis in the paracentesis yesterday.  Breathing is better today.  No chest pain, no abdominal pain, no nausea or vomiting.  Still has swelling all over ? ?Assesement and Plan: ?Principal Problem: ?  Cirrhosis (HCC) ?Active Problems: ?  Hypertension ?  Chronic hepatitis C (HCC) ?  Thrombocytopenia (HCC) ?  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  Anasarca ?  Pleural effusion on right ?  Elevated LFTs ?  Hypoalbuminemia ?  EtOH dependence (HCC) ? ? ?Principal problem ?Hypertensive urgency-blood pressure trends much better.  Continue hydralazine, changed to twice daily to avoid hypotension in the setting of diuresis.  Continue Coreg. ? ?Active problems ?Liver cirrhosis, hepatitis C, chronic EtOH use, anasarca-this appears to be decompensated with ascites,  right-sided pleural effusion as well as anasarca.  A 2D echo done shows normal EF 60-65%, grade 1 diastolic dysfunction.  RV was normal.  She was placed on furosemide, continue.  Repeat albumin today.  Underwent thoracentesis and paracentesis on 4/28, fluid studies without concern for infection.  SAAG suggests pleural effusion and ascites due to her liver disease.  Potentially switch to p.o. diuretics tomorrow ? ?Elevated LFTs-due to # her liver disease, overall stable ? ?Thrombocytopenia-due to liver disease, no bleeding, continue to monitor platelets ? ?Scheduled Meds: ? carvedilol  6.25 mg Oral BID WC  ? folic acid  1 mg Oral Daily  ? furosemide  40 mg Intravenous Q12H  ? hydrALAZINE  25 mg Oral BID  ? multivitamin with minerals  1 tablet Oral Daily  ? sodium chloride flush  3 mL Intravenous Q12H  ? spironolactone  100 mg Oral Daily  ? thiamine  100 mg Oral Daily  ? Or  ? thiamine  100 mg Intravenous Daily  ? ?Continuous Infusions: ? sodium chloride    ? albumin human    ? ?PRN Meds:.sodium chloride, acetaminophen, albuterol, LORazepam **OR** LORazepam, ondansetron (ZOFRAN) IV, sodium chloride flush ? ?Diet Orders (From admission, onward)  ? ?  Start     Ordered  ? 10/26/21 1559  Diet Heart Room service appropriate? Yes; Fluid consistency: Thin; Fluid restriction: 1800 mL Fluid  Diet effective now       ?Question Answer Comment  ?Room service appropriate? Yes   ?Fluid consistency: Thin   ?  Fluid restriction: 1800 mL Fluid   ?  ? 10/26/21 1600  ? ?  ?  ? ?  ? ? ?DVT prophylaxis: Place TED hose Start: 10/28/21 0940 ?Foot Pump Start: 10/26/21 1622 ? ? ?Lab Results  ?Component Value Date  ? PLT 62 (L) 10/28/2021  ? ? ?  Code Status: Full Code ? ?Family Communication: No family at bedside ? ?Status is: Inpatient ? ?Remains inpatient appropriate because: Poorly controlled blood pressure, severe anasarca and decompensated liver disease ? ? ?Level of care: Telemetry Medical ? ?Consultants:  ?none ? ?Procedures:  ?2D  echo ? ?Microbiology  ?none ? ?Antimicrobials: ?none  ? ? ?Objective: ?Vitals:  ? 10/28/21 0404 10/28/21 0500 10/28/21 1610 10/28/21 0826  ?BP: 117/67  (!) 147/80 138/71  ?Pulse: 71  69 83  ?Resp: 18  18 18   ?Temp: 98.8 ?F (37.1 ?C)  98.5 ?F (36.9 ?C) 97.7 ?F (36.5 ?C)  ?TempSrc: Oral  Oral Oral  ?SpO2: 93%   94%  ?Weight:  74.7 kg    ?Height:      ? ? ?Intake/Output Summary (Last 24 hours) at 10/28/2021 0939 ?Last data filed at 10/28/2021 10/30/2021 ?Gross per 24 hour  ?Intake 300 ml  ?Output 2300 ml  ?Net -2000 ml  ? ? ?Wt Readings from Last 3 Encounters:  ?10/28/21 74.7 kg  ?09/11/17 81.6 kg  ?09/10/17 82.3 kg  ? ? ?Examination: ? ?Constitutional: NAD ?Eyes: Anicteric ?ENMT: mmm ?Neck: normal, supple ?Respiratory: Diminished at the bases, no wheezing ?Cardiovascular: RRR, no murmurs.  2+ pitting lower extremity edema ?Abdomen: Soft, NT, ND, bowel sounds positive ?Musculoskeletal: no clubbing / cyanosis.  ?Skin: no new rashes, chronic venous stasis changes bilateral lower extremities ?Neurologic: No focal deficits ? ? ?Data Reviewed: I have independently reviewed following labs and imaging studies  ? ?CBC ?Recent Labs  ?Lab 10/26/21 ?1152 10/28/21 ?0040  ?WBC 6.9 4.9  ?HGB 13.6 10.9*  ?HCT 41.0 30.8*  ?PLT 76* 62*  ?MCV 108.2* 105.5*  ?MCH 35.9* 37.3*  ?MCHC 33.2 35.4  ?RDW 14.6 14.6  ? ? ? ?Recent Labs  ?Lab 10/26/21 ?1152 10/26/21 ?1309 10/26/21 ?1630 10/27/21 ?0101 10/28/21 ?0040  ?NA 143  --   --  138 139  ?K 3.4*  --   --  3.7 3.3*  ?CL 106  --   --  107 105  ?CO2 30  --   --  24 30  ?GLUCOSE 137*  --   --  153* 195*  ?BUN 14  --   --  13 14  ?CREATININE 0.85  --   --  0.66 0.90  ?CALCIUM 8.6*  --   --  8.2* 8.3*  ?AST  --  98*  --  68* 57*  ?ALT  --  75*  --  57* 46*  ?ALKPHOS  --  104  --  87 75  ?BILITOT  --  3.3*  --  2.2* 1.6*  ?ALBUMIN  --  2.8*  --  2.3* 2.3*  ?MG  --   --   --   --  1.8  ?LATICACIDVEN  --  1.6  --   --   --   ?INR  --  1.3*  --   --   --   ?AMMONIA  --   --  16  --   --    ? ? ? ?------------------------------------------------------------------------------------------------------------------ ?No results for input(s): CHOL, HDL, LDLCALC, TRIG, CHOLHDL, LDLDIRECT in the last 72 hours. ? ?No results found for: HGBA1C ?------------------------------------------------------------------------------------------------------------------ ?  No results for input(s): TSH, T4TOTAL, T3FREE, THYROIDAB in the last 72 hours. ? ?Invalid input(s): FREET3 ? ?Cardiac Enzymes ?No results for input(s): CKMB, TROPONINI, MYOGLOBIN in the last 168 hours. ? ?Invalid input(s): CK ?------------------------------------------------------------------------------------------------------------------ ?No results found for: BNP ? ?CBG: ?No results for input(s): GLUCAP in the last 168 hours. ? ?Recent Results (from the past 240 hour(s))  ?Culture, blood (routine x 2)     Status: None (Preliminary result)  ? Collection Time: 10/26/21 12:36 PM  ? Specimen: BLOOD  ?Result Value Ref Range Status  ? Specimen Description BLOOD LEFT ANTECUBITAL  Final  ? Special Requests   Final  ?  BOTTLES DRAWN AEROBIC AND ANAEROBIC Blood Culture results may not be optimal due to an excessive volume of blood received in culture bottles  ? Culture   Final  ?  NO GROWTH 2 DAYS ?Performed at [facility], [address] [place], [place] [id number] ?  ? Report Status PENDING  Incomplete  ?Resp Panel by RT-PCR (Flu A&B, Covid) Nasopharyngeal Swab     Status: None  ? Collection Time: 10/26/21 12:36 PM  ? Specimen: Nasopharyngeal Swab; Nasopharyngeal(NP) swabs in vial transport medium  ?Result Value Ref Range Status  ? SARS Coronavirus 2 by RT PCR NEGATIVE NEGATIVE Final  ?  Comment: (NOTE) ?SARS-CoV-2 target nucleic acids are NOT DETECTED. ? ?The SARS-CoV-2 RNA is generally detectable in upper respiratory ?specimens during the acute phase of infection. The lowest ?concentration of SARS-CoV-2 viral copies this assay can detect is ?138  copies/mL. A negative result does not preclude SARS-Cov-2 ?infection and should not be used as the sole basis for treatment or ?other patient management decisions. A negative result may occur with  ?improper specimen collection/handling, s

## 2021-10-29 ENCOUNTER — Inpatient Hospital Stay (HOSPITAL_COMMUNITY): Payer: Self-pay

## 2021-10-29 DIAGNOSIS — D539 Nutritional anemia, unspecified: Secondary | ICD-10-CM

## 2021-10-29 DIAGNOSIS — E876 Hypokalemia: Secondary | ICD-10-CM

## 2021-10-29 LAB — CBC
HCT: 32.3 % — ABNORMAL LOW (ref 36.0–46.0)
Hemoglobin: 11.3 g/dL — ABNORMAL LOW (ref 12.0–15.0)
MCH: 37.2 pg — ABNORMAL HIGH (ref 26.0–34.0)
MCHC: 35 g/dL (ref 30.0–36.0)
MCV: 106.3 fL — ABNORMAL HIGH (ref 80.0–100.0)
Platelets: 60 10*3/uL — ABNORMAL LOW (ref 150–400)
RBC: 3.04 MIL/uL — ABNORMAL LOW (ref 3.87–5.11)
RDW: 14.9 % (ref 11.5–15.5)
WBC: 4.4 10*3/uL (ref 4.0–10.5)
nRBC: 0 % (ref 0.0–0.2)

## 2021-10-29 LAB — BASIC METABOLIC PANEL
Anion gap: 6 (ref 5–15)
BUN: 21 mg/dL — ABNORMAL HIGH (ref 6–20)
CO2: 30 mmol/L (ref 22–32)
Calcium: 8 mg/dL — ABNORMAL LOW (ref 8.9–10.3)
Chloride: 100 mmol/L (ref 98–111)
Creatinine, Ser: 1.05 mg/dL — ABNORMAL HIGH (ref 0.44–1.00)
GFR, Estimated: >60 mL/min (ref >60)
Glucose, Bld: 192 mg/dL — ABNORMAL HIGH (ref 70–99)
Potassium: 3.1 mmol/L — ABNORMAL LOW (ref 3.5–5.1)
Sodium: 136 mmol/L (ref 135–145)

## 2021-10-29 LAB — MAGNESIUM: Magnesium: 1.8 mg/dL (ref 1.7–2.4)

## 2021-10-29 MED ORDER — ALBUMIN HUMAN 25 % IV SOLN
25.0000 g | Freq: Once | INTRAVENOUS | Status: AC
  Administered 2021-10-29: 25 g via INTRAVENOUS
  Filled 2021-10-29: qty 100

## 2021-10-29 MED ORDER — POTASSIUM CHLORIDE CRYS ER 20 MEQ PO TBCR
40.0000 meq | EXTENDED_RELEASE_TABLET | ORAL | Status: AC
  Administered 2021-10-29 (×2): 40 meq via ORAL
  Filled 2021-10-29 (×2): qty 2

## 2021-10-29 MED ORDER — OXYCODONE HCL 5 MG PO TABS
5.0000 mg | ORAL_TABLET | Freq: Four times a day (QID) | ORAL | Status: DC | PRN
  Administered 2021-10-29 – 2021-10-30 (×3): 5 mg via ORAL
  Filled 2021-10-29 (×3): qty 1

## 2021-10-29 MED ORDER — FUROSEMIDE 40 MG PO TABS
40.0000 mg | ORAL_TABLET | Freq: Every day | ORAL | Status: DC
  Administered 2021-10-30: 40 mg via ORAL
  Filled 2021-10-29 (×2): qty 1

## 2021-10-29 NOTE — Plan of Care (Signed)
  Problem: Nutrition: Goal: Adequate nutrition will be maintained Outcome: Progressing   Problem: Pain Managment: Goal: General experience of comfort will improve Outcome: Progressing   Problem: Safety: Goal: Ability to remain free from injury will improve Outcome: Progressing   

## 2021-10-29 NOTE — Progress Notes (Signed)
?PROGRESS NOTE ? ?Mary Anderson [id number] DOB: 1965-08-08 DOA: 10/26/2021 ?PCP: Pcp, No ? ? LOS: 3 days  ? ?Brief Narrative / Interim history: ?This is a 56 year old female with history of essential hypertension, on noncompliant with medication and has not been taking any prescriptions for the past year, lost to follow-up for the past 4 years, used to be seen in IM clinic here at [facility], has cirrhosis secondary to hep C as well as alcohol abuse, comes into the hospital with increasing shortness of breath, abdominal and leg swelling.  This is been going on for quite some time, but over the last couple weeks she has been getting worse to the point that she is barely able to get around.  She tells me she stopped drinking alcohol altogether about 2 weeks ago.  She went to urgent care about a week ago, was told that she has pneumonia and was given doxycycline.  Given lack of improvement she presented to the ED and was admitted.  She was found to be significantly hypertensive in the 123456 systolic as well as have profuse anasarca along with a right-sided pleural effusion. ? ?Subjective / 24h Interval events: ?Appreciates her swelling has significantly improved.  Complains of pleuritic type right-sided chest pain this morning ? ?Assesement and Plan: ?Principal Problem: ?  Cirrhosis ([place]) ?Active Problems: ?  Hypertension ?  Chronic hepatitis C ([place]) ?  Thrombocytopenia ([place]) ?  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  Anasarca ?  Pleural effusion on right ?  Elevated LFTs ?  Hypoalbuminemia ?  EtOH dependence ([place]) ?  Hypokalemia ?  Macrocytic anemia ? ? ?Principal problem ?Hypertensive urgency-on admission, her systolic was in the A999333 range.  She was started on carvedilol, hydralazine and was diuresed with Lasix with significant improvement and currently blood pressure has normalized.  Continue Coreg, hydralazine, changed furosemide to p.o.  She is also on spironolactone ? ?Active  problems ?Liver cirrhosis, hepatitis C, chronic EtOH use, anasarca-this appears to be decompensated with ascites, right-sided pleural effusion as well as anasarca.  A 2D echo done shows normal EF 123456, grade 1 diastolic dysfunction.  RV was normal.  She was diuresed with IV furosemide, weight has improved from 182 on admission to 165.  Creatinine slightly up today, stop IV diuretics and change to p.o. furosemide, 40 mg daily, and continue spironolactone, 100 mg daily.  She has a follow-up appointment on May 11 with a new PCP, strongly encouraged to follow-up, she will need referral to GI for further management of her liver disease as well as hepatitis C.  If renal function is stable tomorrow could potentially go home as she appears more euvolemic ? ?Elevated LFTs-due to # her liver disease, overall stable ? ?Thrombocytopenia-due to liver disease, no bleeding, monitor ? ?Macrocytic anemia-check anemia panel in the morning, including folate and B12. ? ?Alcohol abuse, in remission-sober for the past 2 weeks.  Continue to encourage ongoing cessation ? ?Hypokalemia-continue to replete and monitor ? ?Right-sided chest pain-chest x-ray this morning but suspect she has a residual pleural effusion ? ?Scheduled Meds: ? carvedilol  6.25 mg Oral BID WC  ? folic acid  1 mg Oral Daily  ? furosemide  40 mg Oral Daily  ? hydrALAZINE  25 mg Oral BID  ? multivitamin with minerals  1 tablet Oral Daily  ? potassium chloride  40 mEq Oral Q2H  ? sodium chloride flush  3 mL Intravenous Q12H  ? spironolactone  100  mg Oral Daily  ? thiamine  100 mg Oral Daily  ? Or  ? thiamine  100 mg Intravenous Daily  ? ?Continuous Infusions: ? sodium chloride    ? ?PRN Meds:.sodium chloride, acetaminophen, albuterol, LORazepam **OR** LORazepam, ondansetron (ZOFRAN) IV, sodium chloride flush ? ?Diet Orders (From admission, onward)  ? ?  Start     Ordered  ? 10/26/21 1559  Diet Heart Room service appropriate? Yes; Fluid consistency: Thin; Fluid  restriction: 1800 mL Fluid  Diet effective now       ?Question Answer Comment  ?Room service appropriate? Yes   ?Fluid consistency: Thin   ?Fluid restriction: 1800 mL Fluid   ?  ? 10/26/21 1600  ? ?  ?  ? ?  ? ? ?DVT prophylaxis: Place TED hose Start: 10/28/21 0940 ?Foot Pump Start: 10/26/21 1622 ? ? ?Lab Results  ?Component Value Date  ? PLT 60 (L) 10/29/2021  ? ? ?  Code Status: Full Code ? ?Family Communication: Significant other present at bedside ? ?Status is: Inpatient ? ?Remains inpatient appropriate because: Slight bump in creatinine, ensure that she tolerates p.o. medications, anticipate discharge on Monday ? ? ?Level of care: Telemetry Medical ? ?Consultants:  ?none ? ?Procedures:  ?2D echo ? ?Microbiology  ?none ? ?Antimicrobials: ?none  ? ? ?Objective: ?Vitals:  ? 10/28/21 2138 10/29/21 0425 10/29/21 0500 10/29/21 0751  ?BP: 111/72 135/72  133/70  ?Pulse: 72 71  72  ?Resp: 17 19  15   ?Temp: 98.5 ?F (36.9 ?C) 98.9 ?F (37.2 ?C)  98.3 ?F (36.8 ?C)  ?TempSrc: Oral Oral  Oral  ?SpO2: 95% 94%  97%  ?Weight:   75 kg   ?Height:      ? ? ?Intake/Output Summary (Last 24 hours) at 10/29/2021 1039 ?Last data filed at 10/29/2021 0751 ?Gross per 24 hour  ?Intake 150 ml  ?Output 1175 ml  ?Net -1025 ml  ? ? ?Wt Readings from Last 3 Encounters:  ?10/29/21 75 kg  ?09/11/17 81.6 kg  ?09/10/17 82.3 kg  ? ? ?Examination: ? ?Constitutional: NAD ?Eyes: lids and conjunctivae normal, no scleral icterus ?ENMT: mmm ?Neck: normal, supple ?Respiratory: Diminished at the bases but overall clear to auscultation bilaterally, no wheezing, no crackles. Normal respiratory effort.  ?Cardiovascular: Regular rate and rhythm, no murmurs / rubs / gallops.  1+ LE edema. ?Abdomen: soft, no distention, no tenderness. Bowel sounds positive.  ?Skin: no rashes ?Neurologic: no focal deficits, equal strength ? ? ? ?Data Reviewed: I have independently reviewed following labs and imaging studies  ? ?CBC ?Recent Labs  ?Lab 10/26/21 ?1152 10/28/21 ?0040  10/29/21 ?0013  ?WBC 6.9 4.9 4.4  ?HGB 13.6 10.9* 11.3*  ?HCT 41.0 30.8* 32.3*  ?PLT 76* 62* 60*  ?MCV 108.2* 105.5* 106.3*  ?MCH 35.9* 37.3* 37.2*  ?MCHC 33.2 35.4 35.0  ?RDW 14.6 14.6 14.9  ? ? ? ?Recent Labs  ?Lab 10/26/21 ?1152 10/26/21 ?1309 10/26/21 ?1630 10/27/21 ?0101 10/28/21 ?0040 10/29/21 ?0013  ?NA 143  --   --  138 139 136  ?K 3.4*  --   --  3.7 3.3* 3.1*  ?CL 106  --   --  107 105 100  ?CO2 30  --   --  24 30 30   ?GLUCOSE 137*  --   --  153* 195* 192*  ?BUN 14  --   --  13 14 21*  ?CREATININE 0.85  --   --  0.66 0.90 1.05*  ?CALCIUM 8.6*  --   --  8.2* 8.3* 8.0*  ?AST  --  98*  --  68* 57*  --   ?ALT  --  75*  --  57* 46*  --   ?ALKPHOS  --  104  --  87 75  --   ?BILITOT  --  3.3*  --  2.2* 1.6*  --   ?ALBUMIN  --  2.8*  --  2.3* 2.3*  --   ?MG  --   --   --   --  1.8 1.8  ?LATICACIDVEN  --  1.6  --   --   --   --   ?INR  --  1.3*  --   --   --   --   ?AMMONIA  --   --  16  --   --   --   ? ? ? ?------------------------------------------------------------------------------------------------------------------ ?No results for input(s): CHOL, HDL, LDLCALC, TRIG, CHOLHDL, LDLDIRECT in the last 72 hours. ? ?No results found for: HGBA1C ?------------------------------------------------------------------------------------------------------------------ ?No results for input(s): TSH, T4TOTAL, T3FREE, THYROIDAB in the last 72 hours. ? ?Invalid input(s): FREET3 ? ?Cardiac Enzymes ?No results for input(s): CKMB, TROPONINI, MYOGLOBIN in the last 168 hours. ? ?Invalid input(s): CK ?------------------------------------------------------------------------------------------------------------------ ?No results found for: BNP ? ?CBG: ?No results for input(s): GLUCAP in the last 168 hours. ? ?Recent Results (from the past 240 hour(s))  ?Culture, blood (routine x 2)     Status: None (Preliminary result)  ? Collection Time: 10/26/21 12:36 PM  ? Specimen: BLOOD  ?Result Value Ref Range Status  ? Specimen Description BLOOD  LEFT ANTECUBITAL  Final  ? Special Requests   Final  ?  BOTTLES DRAWN AEROBIC AND ANAEROBIC Blood Culture results may not be optimal due to an excessive volume of blood received in culture bottles  ? Culture   Final  ?  NO G

## 2021-10-30 ENCOUNTER — Other Ambulatory Visit (HOSPITAL_COMMUNITY): Payer: Self-pay

## 2021-10-30 ENCOUNTER — Encounter (HOSPITAL_COMMUNITY): Payer: Self-pay

## 2021-10-30 LAB — CBC
HCT: 32.7 % — ABNORMAL LOW (ref 36.0–46.0)
Hemoglobin: 11.3 g/dL — ABNORMAL LOW (ref 12.0–15.0)
MCH: 36.7 pg — ABNORMAL HIGH (ref 26.0–34.0)
MCHC: 34.6 g/dL (ref 30.0–36.0)
MCV: 106.2 fL — ABNORMAL HIGH (ref 80.0–100.0)
Platelets: 60 10*3/uL — ABNORMAL LOW (ref 150–400)
RBC: 3.08 MIL/uL — ABNORMAL LOW (ref 3.87–5.11)
RDW: 14.6 % (ref 11.5–15.5)
WBC: 3.8 10*3/uL — ABNORMAL LOW (ref 4.0–10.5)
nRBC: 0 % (ref 0.0–0.2)

## 2021-10-30 LAB — COMPREHENSIVE METABOLIC PANEL
ALT: 41 U/L (ref 0–44)
AST: 56 U/L — ABNORMAL HIGH (ref 15–41)
Albumin: 2.6 g/dL — ABNORMAL LOW (ref 3.5–5.0)
Alkaline Phosphatase: 84 U/L (ref 38–126)
Anion gap: 6 (ref 5–15)
BUN: 19 mg/dL (ref 6–20)
CO2: 31 mmol/L (ref 22–32)
Calcium: 8.4 mg/dL — ABNORMAL LOW (ref 8.9–10.3)
Chloride: 99 mmol/L (ref 98–111)
Creatinine, Ser: 0.94 mg/dL (ref 0.44–1.00)
GFR, Estimated: >60 mL/min (ref >60)
Glucose, Bld: 207 mg/dL — ABNORMAL HIGH (ref 70–99)
Potassium: 3.8 mmol/L (ref 3.5–5.1)
Sodium: 136 mmol/L (ref 135–145)
Total Bilirubin: 1.4 mg/dL — ABNORMAL HIGH (ref 0.3–1.2)
Total Protein: 5.5 g/dL — ABNORMAL LOW (ref 6.5–8.1)

## 2021-10-30 LAB — FOLATE: Folate: 15.4 ng/mL (ref >5.9)

## 2021-10-30 LAB — RETICULOCYTES
Immature Retic Fract: 16.2 % — ABNORMAL HIGH (ref 2.3–15.9)
RBC.: 3.03 MIL/uL — ABNORMAL LOW (ref 3.87–5.11)
Retic Count, Absolute: 156.3 10*3/uL (ref 19.0–186.0)
Retic Ct Pct: 5.2 % — ABNORMAL HIGH (ref 0.4–3.1)

## 2021-10-30 LAB — IRON AND TIBC
Iron: 155 ug/dL (ref 28–170)
Saturation Ratios: 79 % — ABNORMAL HIGH (ref 10.4–31.8)
TIBC: 197 ug/dL — ABNORMAL LOW (ref 250–450)
UIBC: 42 ug/dL

## 2021-10-30 LAB — FERRITIN: Ferritin: 197 ng/mL (ref 11–307)

## 2021-10-30 LAB — MAGNESIUM: Magnesium: 1.9 mg/dL (ref 1.7–2.4)

## 2021-10-30 LAB — CYTOLOGY - NON PAP

## 2021-10-30 LAB — VITAMIN B12: Vitamin B-12: 1149 pg/mL — ABNORMAL HIGH (ref 180–914)

## 2021-10-30 MED ORDER — CARVEDILOL 6.25 MG PO TABS
6.2500 mg | ORAL_TABLET | Freq: Two times a day (BID) | ORAL | 1 refills | Status: DC
  Filled 2021-10-30: qty 60, 30d supply, fill #0

## 2021-10-30 MED ORDER — SPIRONOLACTONE 100 MG PO TABS
100.0000 mg | ORAL_TABLET | Freq: Every day | ORAL | 1 refills | Status: DC
  Filled 2021-10-30: qty 30, 30d supply, fill #0

## 2021-10-30 MED ORDER — FOLIC ACID 1 MG PO TABS
1.0000 mg | ORAL_TABLET | Freq: Every day | ORAL | 1 refills | Status: DC
  Filled 2021-10-30: qty 30, 30d supply, fill #0

## 2021-10-30 MED ORDER — FUROSEMIDE 40 MG PO TABS
40.0000 mg | ORAL_TABLET | Freq: Every day | ORAL | 1 refills | Status: DC
  Filled 2021-10-30: qty 30, 30d supply, fill #0

## 2021-10-30 MED ORDER — HYDRALAZINE HCL 25 MG PO TABS
25.0000 mg | ORAL_TABLET | Freq: Two times a day (BID) | ORAL | 1 refills | Status: DC
  Filled 2021-10-30: qty 60, 30d supply, fill #0

## 2021-10-30 MED ORDER — THIAMINE HCL 100 MG PO TABS
100.0000 mg | ORAL_TABLET | Freq: Every day | ORAL | 1 refills | Status: DC
  Filled 2021-10-30: qty 30, 30d supply, fill #0

## 2021-10-30 NOTE — Plan of Care (Signed)

## 2021-10-30 NOTE — Progress Notes (Signed)
Nsg Discharge Note ? ?Admit Date:  10/26/2021 ?Discharge date: 10/30/2021 ?  ?Mary Anderson to be D/C'd Home per MD order.  AVS completed.   ?Removed IV-CDI. Reviewed d/c paperwork with patient and significant other. Answered all questions. Pharmacy brought medication to patient's room and then she was wheeled down to main entrance. ? ?Discharge Medication: ?Allergies as of 10/30/2021   ?No Known Allergies ?  ? ?  ?Medication List  ?  ? ?STOP taking these medications   ? ?doxycycline 100 MG capsule ?Commonly known as: VIBRAMYCIN ?  ?hydrochlorothiazide 12.5 MG tablet ?Commonly known as: HYDRODIURIL ?  ?lisinopril 20 MG tablet ?Commonly known as: ZESTRIL ?  ?predniSONE 20 MG tablet ?Commonly known as: DELTASONE ?  ? ?  ? ?TAKE these medications   ? ?acetaminophen 500 MG tablet ?Commonly known as: TYLENOL ?Take 500 mg by mouth daily. ?  ?carvedilol 6.25 MG tablet ?Commonly known as: COREG ?Take 1 tablet (6.25 mg total) by mouth 2 (two) times daily with a meal. ?  ?folic acid 1 MG tablet ?Commonly known as: FOLVITE ?Take 1 tablet (1 mg total) by mouth daily. ?Start taking on: Oct 31, 2021 ?  ?furosemide 40 MG tablet ?Commonly known as: LASIX ?Take 1 tablet (40 mg total) by mouth daily. ?Start taking on: Oct 31, 2021 ?  ?hydrALAZINE 25 MG tablet ?Commonly known as: APRESOLINE ?Take 1 tablet (25 mg total) by mouth 2 (two) times daily. ?  ?ibuprofen 200 MG tablet ?Commonly known as: ADVIL ?Take 200-400 mg by mouth daily as needed (pain). ?  ?promethazine-dextromethorphan 6.25-15 MG/5ML syrup ?Commonly known as: PROMETHAZINE-DM ?Take 5 mLs by mouth 3 (three) times daily as needed for cough. ?  ?Sofosbuvir-Velpatasvir 400-100 MG Tabs ?Commonly known as: Epclusa ?Take 1 tablet by mouth daily with breakfast. ?  ?spironolactone 100 MG tablet ?Commonly known as: ALDACTONE ?Take 1 tablet (100 mg total) by mouth daily. ?Start taking on: Oct 31, 2021 ?  ?thiamine 100 MG tablet ?Take 1 tablet (100 mg total) by mouth daily. ?Start taking  on: Oct 31, 2021 ?  ? ?  ? ? ?Discharge Assessment: ?Vitals:  ? 10/30/21 0416 10/30/21 0749  ?BP: 118/68 (!) 148/78  ?Pulse: 70 64  ?Resp: 14 18  ?Temp: 98 ?F (36.7 ?C) 98.2 ?F (36.8 ?C)  ?SpO2: 95% 97%  ? Skin clean, dry and intact without evidence of skin break down, no evidence of skin tears noted. ?IV catheter discontinued intact. Site without signs and symptoms of complications - no redness or edema noted at insertion site, patient denies c/o pain - only slight tenderness at site.  Dressing with slight pressure applied. ? ?D/c Instructions-Education: ?Discharge instructions given to patient/family with verbalized understanding. ?D/c education completed with patient/family including follow up instructions, medication list, d/c activities limitations if indicated, with other d/c instructions as indicated by MD - patient able to verbalize understanding, all questions fully answered. ?Patient instructed to return to ED, call 911, or call MD for any changes in condition.  ?Patient escorted via WC, and D/C home via private auto. ? ?Karolee Ohs, RN ?10/30/2021 11:16 AM  ?

## 2021-10-30 NOTE — Discharge Summary (Signed)
Physician Discharge Summary  ?Mary Anderson NUU:[id number] DOB: May 28, 1966 DOA: 10/26/2021 ? ?PCP: Pcp, No ? ?Admit date: 10/26/2021 ?Discharge date: 10/30/2021 ? ?Admitted From: Home ?Disposition:  Home ? ?Discharge Condition:Stable ?CODE STATUS:FULL ?Diet recommendation: Heart Healthy ?Brief/Interim Summary: ?Patient is a 56 year old female with history of essential hypertension, on noncompliant with medication and has not been taking any prescriptions for the past year, lost to follow-up for the past 4 years, used to be seen in IM clinic here at [facility], has cirrhosis secondary to hep C as well as alcohol abuse, comes into the hospital with increasing shortness of breath, abdominal and leg swelling.  This is been going on for quite some time, but over the last couple weeks she has been getting worse to the point that she is barely able to get around.  She tells me she stopped drinking alcohol altogether about 2 weeks ago.  She went to urgent care about a week ago, was told that she has pneumonia and was given doxycycline.  Given lack of improvement she presented to the ED and was admitted.  She was found to be significantly hypertensive in the 200s systolic as well as have profuse anasarca along with a right-sided pleural effusion.  She was started on antihypertensives, diuretics with significant improvement.  Currently she is hemodynamically stable for discharge. ? ?Following problems were addressed during her hospitalization: ? ?Hypertensive urgency-on admission, her systolic was in the 200 range.  She was started on carvedilol, hydralazine and was diuresed with Lasix with significant improvement and currently blood pressure has normalized.  Continue Coreg, hydralazine, lasix, spironolactone. ? ?Liver cirrhosis, hepatitis C, chronic EtOH use, anasarca-this appears to be decompensated with ascites, right-sided pleural effusion as well as anasarca.  A 2D echo done shows normal EF 60-65%, grade 1 diastolic dysfunction.  RV  was normal.  She was diuresed with IV furosemide, weight has improved from 182 on admission to 165.  Creatinine slightly up today, stop IV diuretics and change to p.o. furosemide, 40 mg daily, and continue spironolactone, 100 mg daily.  She has a follow-up appointment on May 11 with a new PCP, strongly encouraged to follow-up, she will need referral to GI for further management of her liver disease as well as hepatitis C.  If renal function is stable tomorrow could potentially go home as she appears more euvolemic ?  ?Elevated LFTs-due to # her liver disease, overall stable ?  ?Thrombocytopenia-due to liver disease, no bleeding, monitor ?  ?Macrocytic anemia-Normal folate and B12. ?  ?Alcohol abuse, in remission-sober for the past 2 weeks.  Continue to encourage ongoing cessation ?  ?Hypokalemia-continue to replete and monitor ?  ? ? ?Discharge Diagnoses:  ?Principal Problem: ?  Cirrhosis ([facility]) ?Active Problems: ?  Hypertension ?  Chronic hepatitis C ([facility]) ?  Thrombocytopenia ([facility]) ?  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  Anasarca ?  Pleural effusion on right ?  Elevated LFTs ?  Hypoalbuminemia ?  EtOH dependence ([facility]) ?  Hypokalemia ?  Macrocytic anemia ? ? ? ?Discharge Instructions ? ?Discharge Instructions   ? ? Diet - low sodium heart healthy   Complete by: As directed ?  ? Discharge instructions   Complete by: As directed ?  ? 1)Please take prescribed medications as instructed ?2)Follow up with primary care physician on May 11.Do a CBC and BMP test during that follow-up ?3)Follow up with gastroenterology as an outpatient through the referral of primary care physician  ?  Increase activity slowly   Complete by: As directed ?  ? ?  ? ?Allergies as of 10/30/2021   ?No Known Allergies ?  ? ?  ?Medication List  ?  ? ?STOP taking these medications   ? ?doxycycline 100 MG capsule ?Commonly known as: VIBRAMYCIN ?  ?hydrochlorothiazide 12.5 MG tablet ?Commonly known as: HYDRODIURIL ?   ?lisinopril 20 MG tablet ?Commonly known as: ZESTRIL ?  ?predniSONE 20 MG tablet ?Commonly known as: DELTASONE ?  ? ?  ? ?TAKE these medications   ? ?acetaminophen 500 MG tablet ?Commonly known as: TYLENOL ?Take 500 mg by mouth daily. ?  ?carvedilol 6.25 MG tablet ?Commonly known as: COREG ?Take 1 tablet (6.25 mg total) by mouth 2 (two) times daily with a meal. ?  ?folic acid 1 MG tablet ?Commonly known as: FOLVITE ?Take 1 tablet (1 mg total) by mouth daily. ?Start taking on: Oct 31, 2021 ?  ?furosemide 40 MG tablet ?Commonly known as: LASIX ?Take 1 tablet (40 mg total) by mouth daily. ?Start taking on: Oct 31, 2021 ?  ?hydrALAZINE 25 MG tablet ?Commonly known as: APRESOLINE ?Take 1 tablet (25 mg total) by mouth 2 (two) times daily. ?  ?ibuprofen 200 MG tablet ?Commonly known as: ADVIL ?Take 200-400 mg by mouth daily as needed (pain). ?  ?promethazine-dextromethorphan 6.25-15 MG/5ML syrup ?Commonly known as: PROMETHAZINE-DM ?Take 5 mLs by mouth 3 (three) times daily as needed for cough. ?  ?Sofosbuvir-Velpatasvir 400-100 MG Tabs ?Commonly known as: Epclusa ?Take 1 tablet by mouth daily with breakfast. ?  ?spironolactone 100 MG tablet ?Commonly known as: ALDACTONE ?Take 1 tablet (100 mg total) by mouth daily. ?Start taking on: Oct 31, 2021 ?  ?thiamine 100 MG tablet ?Take 1 tablet (100 mg total) by mouth daily. ?Start taking on: Oct 31, 2021 ?  ? ?  ? ? Follow-up Information   ? ? Georganna SkeansWilson, Amelia, MD. Go to.   ?Specialty: Family Medicine ?Why: Nov 09, 2021 at 2:40 pm ?Contact information: ?[address] ?[place] [address] ?[phone] ? ? ?  ?  ? ? [facility] Follow up.   ?Contact information: ?[address] ?[place] [id number] ?[phone] ? ?  ?  ? ?  ?  ? ?  ? ?No Known Allergies ? ?Consultations: ?None ? ? ?Procedures/Studies: ?DG Chest 1 View ? ?Result Date: 10/27/2021 ?CLINICAL DATA:  Pleural effusion status post right thoracentesis EXAM: CHEST  1 VIEW  COMPARISON:  Radiograph 10/26/2021 FINDINGS: Decreased size of the right pleural effusion, though with persistent layering fluid resulting in opacification of the right mid to lower hemithorax. Probable residual right basilar atelectasis. No visible pneumothorax. Left lung is clear. No acute osseous abnormality. IMPRESSION: Decreased but persistent right pleural effusion and basilar atelectasis after thoracentesis. No visible pneumothorax. Electronically Signed   By: Caprice RenshawJacob  Kahn M.D.   On: 10/27/2021 16:25  ? ?DG Chest 2 View ? ?Result Date: 10/26/2021 ?CLINICAL DATA:  Shortness of breath, recent diagnosis of pneumonia. EXAM: CHEST - 2 VIEW COMPARISON:  October 20, 2021 FINDINGS: The heart size and mediastinal contours are partially obscured but appear unchanged and within normal limits. Increased size of the large right pleural effusion with adjacent consolidation. Possible minimally displaced fracture of the right scapula. IMPRESSION: Increased size of the large right pleural effusion with adjacent consolidation, differential consideration does include pneumonia with parapneumonic effusion. However suggest further evaluation with chest CT to assess for alternate etiologies of the findings including  malignancy or trauma given patient's recent history of fall and possible minimally displaced right scapular fracture. Electronically Signed   By: Maudry Mayhew M.D.   On: 10/26/2021 10:58  ? ?DG Ribs Unilateral W/Chest Right ? ?Result Date: 10/20/2021 ?CLINICAL DATA:  Status post fall 3 days ago with pain under the right axilla. EXAM: RIGHT RIBS AND CHEST - 3+ VIEW COMPARISON:  August 29, 2017 FINDINGS: No fracture or other bone lesions are seen involving the ribs. There is no evidence of pneumothorax or pleural effusion. There is opacification of the right lung base. Heart size and mediastinal contours are within normal limits. IMPRESSION: 1. No acute right-sided rib fracture or other bone lesions. 2. Opacification of  the right lung base which may represent atelectasis and/or pneumonia. Electronically Signed   By: Aram Candela M.D.   On: 10/20/2021 19:38  ? ?CT CHEST ABDOMEN PELVIS W CONTRAST ? ?Result Date: 10/27/18

## 2021-10-30 NOTE — Plan of Care (Signed)
  Problem: Nutrition: Goal: Adequate nutrition will be maintained Outcome: Progressing   Problem: Pain Managment: Goal: General experience of comfort will improve Outcome: Progressing   Problem: Safety: Goal: Ability to remain free from injury will improve Outcome: Progressing   

## 2021-10-31 LAB — PATHOLOGIST SMEAR REVIEW

## 2021-11-01 LAB — CULTURE, BODY FLUID W GRAM STAIN -BOTTLE
Culture: NO GROWTH
Culture: NO GROWTH

## 2021-11-02 LAB — CULTURE, BLOOD (ROUTINE X 2)
Culture: NO GROWTH
Culture: NO GROWTH

## 2021-11-09 ENCOUNTER — Inpatient Hospital Stay: Payer: Self-pay | Admitting: Family Medicine

## 2021-11-19 NOTE — Progress Notes (Unsigned)
Subjective:    Mary Anderson - 56 y.o. female MRN [id number]  Date of birth: September 25, 1965  HPI  Mary Anderson is to establish care and hospital discharge follow-up.   Current issues and/or concerns: Hospital discharge follow-up: 10/26/2021 - 10/30/2021 [facility] per MD note: Brief/Interim Summary: Patient is a 56 year old female with history of essential hypertension, on noncompliant with medication and has not been taking any prescriptions for the past year, lost to follow-up for the past 4 years, used to be seen in IM clinic here at [facility], has cirrhosis secondary to hep C as well as alcohol abuse, comes into the hospital with increasing shortness of breath, abdominal and leg swelling.  This is been going on for quite some time, but over the last couple weeks she has been getting worse to the point that she is barely able to get around.  She tells me she stopped drinking alcohol altogether about 2 weeks ago.  She went to urgent care about a week ago, was told that she has pneumonia and was given doxycycline.  Given lack of improvement she presented to the ED and was admitted.  She was found to be significantly hypertensive in the 123456 systolic as well as have profuse anasarca along with a right-sided pleural effusion.  She was started on antihypertensives, diuretics with significant improvement.  Currently she is hemodynamically stable for discharge.   Following problems were addressed during her hospitalization:   Hypertensive urgency-on admission, her systolic was in the [id number] range.  She was started on carvedilol, hydralazine and was diuresed with Lasix with significant improvement and currently blood pressure has normalized.  Continue Coreg, hydralazine, lasix, spironolactone.  Liver cirrhosis, hepatitis C, chronic EtOH use, anasarca-this appears to be decompensated with ascites, right-sided pleural effusion as well as anasarca.  A 2D echo done shows normal EF 123456, grade 1 diastolic  dysfunction.  RV was normal.  She was diuresed with IV furosemide, weight has improved from 182 on admission to 165.  Creatinine slightly up today, stop IV diuretics and change to p.o. furosemide, 40 mg daily, and continue spironolactone, 100 mg daily.  She has a follow-up appointment on May 11 with a new PCP, strongly encouraged to follow-up, she will need referral to GI for further management of her liver disease as well as hepatitis C.  If renal function is stable tomorrow could potentially go home as she appears more euvolemic   Elevated LFTs-due to # her liver disease, overall stable   Thrombocytopenia-due to liver disease, no bleeding, monitor   Macrocytic anemia-Normal folate and B12.   Alcohol abuse, in remission-sober for the past 2 weeks.  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   Hypertensive urgency follow-up: Med Adherence: [x]  Yes    []  No Medication side effects: []  Yes    [x]  No Adherence with salt restriction (low-salt diet): trying Exercise: Yes []  No [x]  Home Monitoring?: [x]  Yes    []  No Monitoring Frequency: [x]  Yes    []  No Home BP  results range: [x]  Yes    []  No SOB? []  Yes    [x]  No but does have cough with some congestion, requesting cough medicine refill Chest Pain?: []  Yes    [x]  No Comments: Doing well on Coreg, Hydralazine, Lasix, Spironolactone.  Liver cirrhosis, hepatitis C, chronic EtOH use, anasarca follow-up: Elevated LFTs follow-up: Thrombocytopenia follow-up: Doing well on current  Furosemide and Spironolactone.  Macrocytic anemia follow-up: Doing well on current folate and B12.   Alcohol abuse, in remission follow-up: Reports remaining sober. No increased desire to begin consuming alcohol as of present. Declines counseling.   ROS per HPI    Health Maintenance:  Health Maintenance Due  Topic Date Due   COVID-19 Vaccine (1) Never done   TETANUS/TDAP  Never done   PAP SMEAR-Modifier  Never done   COLONOSCOPY (Pts 45-23yrs Insurance coverage will need to be confirmed)  Never done   MAMMOGRAM  Never done   Zoster Vaccines- Shingrix (1 of 2) Never done     Past Medical History: Patient Active Problem List   Diagnosis Date Noted   Hypokalemia 10/29/2021   Macrocytic anemia 10/29/2021   Anasarca 10/27/2021   Pleural effusion on right 10/27/2021   Elevated LFTs 10/27/2021   Hypoalbuminemia 10/27/2021   EtOH dependence ([place]) 10/27/2021   Malignant HTN with heart disease, w/o CHF, 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 She has never been exposed to tobacco smoke. She has never used smokeless tobacco. She reports current alcohol use. She reports that she does not use drugs.   Family History  family history includes Hypertension in her mother.   Medications: reviewed and updated   Objective:   Physical Exam BP 138/80 (BP Location: Left Arm, Patient Position: Sitting, Cuff Size: Normal)   Pulse 61   Temp 98.3 F (36.8 C)   Resp 18   Ht 5' 3.78" (1.62 m)   Wt 160 lb 3.2 oz (72.7 kg)   LMP 04/22/2011   SpO2 98%   BMI 27.69 kg/m   Physical Exam HENT:     Head: Normocephalic and atraumatic.  Eyes:     Extraocular Movements: Extraocular movements intact.      Conjunctiva/sclera: Conjunctivae normal.     Pupils: Pupils are equal, round, and reactive to light.  Cardiovascular:     Rate and Rhythm: Normal rate and regular rhythm.     Pulses: Normal pulses.     Heart sounds: Normal heart sounds.  Pulmonary:     Effort: Pulmonary effort is normal.     Breath sounds: Normal breath sounds.  Musculoskeletal:     Cervical back: Normal range of motion and neck supple.  Neurological:     General: No focal deficit present.     Mental Status: She is alert and oriented to person, place, and time.  Psychiatric:        Mood and Affect: Mood normal.        Behavior: Behavior normal.       Assessment & Plan:  1. Encounter to establish care: - Patient presents today to establish care.  - Return for annual physical examination, labs, and health maintenance. Arrive fasting meaning having no food for at least 8 hours prior to appointment.  You may have only water or black coffee. Please take scheduled medications as normal.  2. Hospital discharge follow-up: - Reviewed hospital course, current medications, ensured proper follow-up in place, and addressed concerns.  - Update CBC and BMP. - CBC - Basic Metabolic Panel  3. Primary hypertension: - Continue Carvedilol, Hydralazine, Furosemide, and Spironolactone as prescribed.  - Counseled on blood pressure goal of less than 130/80, low-sodium, DASH diet, medication compliance, and 150 minutes of moderate intensity exercise per week as tolerated. Counseled on medication adherence and adverse effects. - Follow-up with primary provider in 4 months or sooner if needed.  - carvedilol (COREG) 6.25 MG tablet; Take 1 tablet (6.25 mg total) by mouth 2 (two) times daily with a meal.  Dispense: 60 tablet; Refill: 3 - hydrALAZINE (APRESOLINE) 25 MG tablet; Take 1 tablet (25 mg total) by mouth 2 (two) times daily.  Dispense: 60 tablet; Refill: 3 - furosemide (LASIX) 40 MG tablet; Take 1 tablet (40 mg total) by mouth daily.   Dispense: 30 tablet; Refill: 3 - spironolactone (ALDACTONE) 100 MG tablet; Take 1 tablet (100 mg total) by mouth daily.  Dispense: 30 tablet; Refill: 3  4. Alcoholic cirrhosis of liver with ascites ([place]): 5. Chronic hepatitis C without hepatic coma ([place]): 6. Anasarca: 7. Elevated LFTs: 8. Thrombocytopenia ([place]): - Continue Furosemide and Spironolactone as prescribed.  - Referral to Gastroenterology for further evaluation and management.  - Ambulatory referral to Gastroenterology - furosemide (LASIX) 40 MG tablet; Take 1 tablet (40 mg total) by mouth daily.  Dispense: 30 tablet; Refill: 3 - spironolactone (ALDACTONE) 100 MG tablet; Take 1 tablet (100 mg total) by mouth daily.  Dispense: 30 tablet; Refill: 3  9. Macrocytic anemia: - Continue Thiamine and Folic Acid as prescribed.  - Follow-up with primary provider as scheduled. - thiamine 100 MG tablet; Take 1 tablet (100 mg total) by mouth daily.  Dispense: 90 tablet; Refill: 0 - folic acid (FOLVITE) 1 MG tablet; Take 1 tablet (1 mg total) by mouth daily.  Dispense: 90 tablet; Refill: 0  10. Alcohol abuse: - Patient declined counseling resources.   11. Hypokalemia: - Update BMP (see #2 above).   12. Cough, unspecified type: - Continue Promethazine-Dextromethorphan as prescribed.  - Follow-up with primary provider as scheduled.  - promethazine-dextromethorphan (PROMETHAZINE-DM) 6.25-15 MG/5ML syrup; Take 5 mLs by mouth 3 (three) times daily as needed for cough.  Dispense: 240 mL; Refill: 0    Patient was given clear instructions to go to Emergency Department or return to medical center if symptoms don't improve, worsen, or new problems develop.The patient verbalized understanding.  I discussed the assessment and treatment plan with the patient. The patient was provided an opportunity to ask questions and all were answered. The patient agreed with the plan and demonstrated an understanding of the instructions.   The patient was  advised to call back or seek an in-person evaluation if the symptoms worsen or if the condition fails to improve as anticipated.    Durene Fruits, NP 11/23/2021, 10:15 AM Primary Care at [facility]

## 2021-11-23 ENCOUNTER — Ambulatory Visit (INDEPENDENT_AMBULATORY_CARE_PROVIDER_SITE_OTHER): Payer: Self-pay | Admitting: Family

## 2021-11-23 ENCOUNTER — Encounter: Payer: Self-pay | Admitting: Family

## 2021-11-23 VITALS — BP 138/80 | HR 61 | Temp 98.3°F | Resp 18 | Ht 63.78 in | Wt 160.2 lb

## 2021-11-23 DIAGNOSIS — Z7689 Persons encountering health services in other specified circumstances: Secondary | ICD-10-CM

## 2021-11-23 DIAGNOSIS — R7989 Other specified abnormal findings of blood chemistry: Secondary | ICD-10-CM

## 2021-11-23 DIAGNOSIS — D696 Thrombocytopenia, unspecified: Secondary | ICD-10-CM

## 2021-11-23 DIAGNOSIS — Z09 Encounter for follow-up examination after completed treatment for conditions other than malignant neoplasm: Secondary | ICD-10-CM

## 2021-11-23 DIAGNOSIS — B182 Chronic viral hepatitis C: Secondary | ICD-10-CM

## 2021-11-23 DIAGNOSIS — E876 Hypokalemia: Secondary | ICD-10-CM

## 2021-11-23 DIAGNOSIS — K7031 Alcoholic cirrhosis of liver with ascites: Secondary | ICD-10-CM

## 2021-11-23 DIAGNOSIS — D539 Nutritional anemia, unspecified: Secondary | ICD-10-CM

## 2021-11-23 DIAGNOSIS — R059 Cough, unspecified: Secondary | ICD-10-CM

## 2021-11-23 DIAGNOSIS — R601 Generalized edema: Secondary | ICD-10-CM

## 2021-11-23 DIAGNOSIS — I10 Essential (primary) hypertension: Secondary | ICD-10-CM

## 2021-11-23 DIAGNOSIS — F1010 Alcohol abuse, uncomplicated: Secondary | ICD-10-CM

## 2021-11-23 MED ORDER — HYDRALAZINE HCL 25 MG PO TABS: 25.0000 mg | ORAL_TABLET | Freq: Two times a day (BID) | ORAL | 3 refills | Status: DC

## 2021-11-23 MED ORDER — FOLIC ACID 1 MG PO TABS: 1.0000 mg | ORAL_TABLET | Freq: Every day | ORAL | 0 refills | Status: DC

## 2021-11-23 MED ORDER — FUROSEMIDE 40 MG PO TABS: 40.0000 mg | ORAL_TABLET | Freq: Every day | ORAL | 3 refills | Status: DC

## 2021-11-23 MED ORDER — SPIRONOLACTONE 100 MG PO TABS: 100.0000 mg | ORAL_TABLET | Freq: Every day | ORAL | 3 refills | Status: DC

## 2021-11-23 MED ORDER — THIAMINE HCL 100 MG PO TABS: 100.0000 mg | ORAL_TABLET | Freq: Every day | ORAL | 0 refills | Status: AC

## 2021-11-23 MED ORDER — PROMETHAZINE-DM 6.25-15 MG/5ML PO SYRP: 5.0000 mL | ORAL_SOLUTION | Freq: Three times a day (TID) | ORAL | 0 refills | Status: DC | PRN

## 2021-11-23 MED ORDER — CARVEDILOL 6.25 MG PO TABS: 6.2500 mg | ORAL_TABLET | Freq: Two times a day (BID) | ORAL | 3 refills | Status: DC

## 2021-11-23 NOTE — Progress Notes (Signed)
.  Pt presents to establish care, and hospital f/u  Wants refill on cough syrup states still experiencing cough and congestion

## 2021-11-23 NOTE — Patient Instructions (Signed)
[facility] you [facility]!    Mary Anderson was seen by Mary FendtAmy J Lurena Naeve, NP today.   Mary Anderson is Mary StabsAmy Skylin Kennerson, NP.   For the best care possible,  you should try to see Mary StabsAmy Nyasia Baxley, NP whenever you come to office.   We look forward to seeing you again soon!  If you have any questions about your visit today,  please call Mary Anderson at [phone]  Or feel free to reach your Anderson via MyChart.    Hypertension, Adult High blood pressure (hypertension) is when the force of blood pumping through the arteries is too strong. The arteries are the blood vessels that carry blood from the heart throughout the body. Hypertension forces the heart to work harder to pump blood and may cause arteries to become narrow or stiff. Untreated or uncontrolled hypertension can lead to a heart attack, heart failure, a stroke, kidney disease, and other problems. A blood pressure reading consists of a higher number over a lower number. Ideally, your blood pressure should be below 120/80. The first ("top") number is called the systolic pressure. It is a measure of the pressure in your arteries as your heart beats. The second ("bottom") number is called the diastolic pressure. It is a measure of the pressure in your arteries as the heart relaxes. What are the causes? The exact cause of this condition is not known. There are some conditions that result in high blood pressure. What increases the risk? Certain factors may make you more likely to develop high blood pressure. Some of these risk factors are under your control, including: Smoking. Not getting enough exercise or physical activity. Being overweight. Having too much fat, sugar, calories, or salt (sodium) in your diet. Drinking too much alcohol. Other risk factors include: Having a personal history of heart disease, diabetes, high cholesterol, or kidney disease. Stress. Having a family  history of high blood pressure and high cholesterol. Having obstructive sleep apnea. Age. The risk increases with age. What are the signs or symptoms? High blood pressure may not cause symptoms. Very high blood pressure (hypertensive crisis) may cause: Headache. Fast or irregular heartbeats (palpitations). Shortness of breath. Nosebleed. Nausea and vomiting. Vision changes. Severe chest pain, dizziness, and seizures. How is this diagnosed? This condition is diagnosed by measuring your blood pressure while you are seated, with your arm resting on a flat surface, your legs uncrossed, and your feet flat on the floor. The cuff of the blood pressure monitor will be placed directly against the skin of your upper arm at the level of your heart. Blood pressure should be measured at least twice using the same arm. Certain conditions can cause a difference in blood pressure between your right and left arms. If you have a high blood pressure reading during one visit or you have normal blood pressure with other risk factors, you may be asked to: Return on a different day to have your blood pressure checked again. Monitor your blood pressure at home for 1 week or longer. If you are diagnosed with hypertension, you may have other blood or imaging tests to help your health care Anderson understand your overall risk for other conditions. How is this treated? This condition is treated by making healthy lifestyle changes, such as eating healthy foods, exercising more, and reducing your alcohol intake. You may be referred for counseling on a healthy diet and physical activity. Your health care Anderson may  prescribe medicine if lifestyle changes are not enough to get your blood pressure under control and if: Your systolic blood pressure is above 130. Your diastolic blood pressure is above 80. Your personal target blood pressure may vary depending on your medical conditions, your age, and other factors. Follow  these instructions at home: Eating and drinking  Eat a diet that is high in fiber and potassium, and low in sodium, added sugar, and fat. An example of this eating plan is called the DASH diet. DASH stands for Dietary Approaches to Stop Hypertension. To eat this way: Eat plenty of fresh fruits and vegetables. Try to fill one half of your plate at each meal with fruits and vegetables. Eat whole grains, such as whole-wheat pasta, Hersh rice, or whole-grain bread. Fill about one fourth of your plate with whole grains. Eat or drink low-fat dairy products, such as skim milk or low-fat yogurt. Avoid fatty cuts of meat, processed or cured meats, and poultry with skin. Fill about one fourth of your plate with lean proteins, such as fish, chicken without skin, beans, eggs, or tofu. Avoid pre-made and processed foods. These tend to be higher in sodium, added sugar, and fat. Reduce your daily sodium intake. Many people with hypertension should eat less than 1,500 mg of sodium a day. Do not drink alcohol if: Your health care Anderson tells you not to drink. You are pregnant, may be pregnant, or are planning to become pregnant. If you drink alcohol: Limit how much you have to: 0-1 drink a day for women. 0-2 drinks a day for men. Know how much alcohol is in your drink. In the [place] one drink equals one 12 oz bottle of beer (355 mL), one 5 oz glass of wine (148 mL), or one 1 oz glass of hard liquor (44 mL). Lifestyle  Work with your health care Anderson to maintain a healthy body weight or to lose weight. Ask what an ideal weight is for you. Get at least 30 minutes of exercise that causes your heart to beat faster (aerobic exercise) most days of the week. Activities may include walking, swimming, or biking. Include exercise to strengthen your muscles (resistance exercise), such as Pilates or lifting weights, as part of your weekly exercise routine. Try to do these types of exercises for 30 minutes at least 3  days a week. Do not use any products that contain nicotine or tobacco. These products include cigarettes, chewing tobacco, and vaping devices, such as e-cigarettes. If you need help quitting, ask your health care Anderson. Monitor your blood pressure at home as told by your health care Anderson. Keep all follow-up visits. This is important. Medicines Take over-the-counter and prescription medicines only as told by your health care Anderson. Follow directions carefully. Blood pressure medicines must be taken as prescribed. Do not skip doses of blood pressure medicine. Doing this puts you at risk for problems and can make the medicine less effective. Ask your health care Anderson about side effects or reactions to medicines that you should watch for. Contact a health care Anderson if you: Think you are having a reaction to a medicine you are taking. Have headaches that keep coming back (recurring). Feel dizzy. Have swelling in your ankles. Have trouble with your vision. Get help right away if you: Develop a severe headache or confusion. Have unusual weakness or numbness. Feel faint. Have severe pain in your chest or abdomen. Vomit repeatedly. Have trouble breathing. These symptoms may be an emergency. Get help right  away. Call 911. Do not wait to see if the symptoms will go away. Do not drive yourself to the hospital. Summary Hypertension is when the force of blood pumping through your arteries is too strong. If this condition is not controlled, it may put you at risk for serious complications. Your personal target blood pressure may vary depending on your medical conditions, your age, and other factors. For most people, a normal blood pressure is less than 120/80. Hypertension is treated with lifestyle changes, medicines, or a combination of both. Lifestyle changes include losing weight, eating a healthy, low-sodium diet, exercising more, and limiting alcohol. This information is not  intended to replace advice given to you by your health care Anderson. Make sure you discuss any questions you have with your health care Anderson. Document Revised: 04/25/2021 Document Reviewed: 04/25/2021 Elsevier Patient Education  2023 [employer].

## 2021-11-24 LAB — CBC
Hematocrit: 34 % (ref 34.0–46.6)
Hemoglobin: 12.4 g/dL (ref 11.1–15.9)
MCH: 36.3 pg — ABNORMAL HIGH (ref 26.6–33.0)
MCHC: 36.5 g/dL — ABNORMAL HIGH (ref 31.5–35.7)
MCV: 99 fL — ABNORMAL HIGH (ref 79–97)
Platelets: 72 10*3/uL — CL (ref 150–450)
RBC: 3.42 x10E6/uL — ABNORMAL LOW (ref 3.77–5.28)
RDW: 11.9 % (ref 11.7–15.4)
WBC: 3 10*3/uL — ABNORMAL LOW (ref 3.4–10.8)

## 2021-11-24 LAB — BASIC METABOLIC PANEL
BUN/Creatinine Ratio: 22 (ref 9–23)
BUN: 17 mg/dL (ref 6–24)
CO2: 24 mmol/L (ref 20–29)
Calcium: 8.6 mg/dL — ABNORMAL LOW (ref 8.7–10.2)
Chloride: 99 mmol/L (ref 96–106)
Creatinine, Ser: 0.79 mg/dL (ref 0.57–1.00)
Glucose: 236 mg/dL — ABNORMAL HIGH (ref 70–99)
Potassium: 4.1 mmol/L (ref 3.5–5.2)
Sodium: 134 mmol/L (ref 134–144)
eGFR: 88 mL/min/{1.73_m2} (ref >59)

## 2021-11-24 NOTE — Progress Notes (Signed)
Kidney function normal.   Platelets remaining lower than normal but improved some since 3 weeks ago. Continue with plan discussed in office. For any abnormal bleeding report immediately to the emergency department for medical evaluation.

## 2021-12-05 ENCOUNTER — Encounter: Payer: Self-pay | Admitting: Gastroenterology

## 2021-12-05 ENCOUNTER — Ambulatory Visit (INDEPENDENT_AMBULATORY_CARE_PROVIDER_SITE_OTHER): Payer: Self-pay | Admitting: Gastroenterology

## 2021-12-05 VITALS — BP 131/80 | HR 59 | Temp 98.1°F | Ht 64.0 in | Wt 158.0 lb

## 2021-12-05 DIAGNOSIS — B182 Chronic viral hepatitis C: Secondary | ICD-10-CM

## 2021-12-05 DIAGNOSIS — Z23 Encounter for immunization: Secondary | ICD-10-CM

## 2021-12-05 DIAGNOSIS — K7460 Unspecified cirrhosis of liver: Secondary | ICD-10-CM

## 2021-12-05 NOTE — Progress Notes (Signed)
Gastroenterology Consultation  Referring Provider:     [name], NP Primary Care Physician:  Mary Fendt, NP Primary Gastroenterologist:  Mary Anderson     Reason for Consultation:     Cirrhosis        HPI:   Mary Anderson is a 56 y.o. y/o female referred for consultation & management of cirrhosis by Dr. Zonia Anderson, Mary Fick, NP.  This patient comes to see me after being in the hospital recently for hypertensive urgency with liver cirrhosis and ascites.  The patient was set up for a urgent GI consult by her primary care provider.  The patient has been not following up with anybody in the last 4 years and not taking any of her hypertensive medications.  The patient has a diagnosis of cirrhosis secondary to hepatitis C and alcohol abuse.  The patient was admitted with worsening shortness of breath that had been present for some time.  Prior to admission the patient had stopped drinking for approximately 2 weeks and had recently been treated for pneumonia with doxycycline just prior to her hospital visit. The patient was found to have decompensated cirrhosis with right-sided pleural effusion.  The patient underwent an echocardiogram which showed ejection fraction of 60 to 65% with diastolic dysfunction.  The patient was discharged with furosemide 40 mg daily and Aldactone 100 mg daily.  The patient's platelets have ran between the 60s and 70s recently.  She was also noted to have an elevated MCV.  Her most recent LFTs have shown:  Component     Latest Ref Rng 10/26/2021 10/27/2021 10/28/2021  Albumin     3.5 - 5.0 g/dL 2.8 (L)  2.3 (L)  2.3 (L)   AST     15 - 41 U/L 98 (H)  68 (H)  57 (H)   ALT     0 - 44 U/L 75 (H)  57 (H)  46 (H)   Alkaline Phosphatase     38 - 126 U/L 104  87  75   Total Bilirubin     0.3 - 1.2 mg/dL 3.3 (H)  2.2 (H)  1.6 (H)    Component     Latest Ref Rng 10/30/2021  Albumin     3.5 - 5.0 g/dL 2.6 (L)   AST     15 - 41 U/L 56 (H)   ALT     0 - 44 U/L 41   Alkaline  Phosphatase     38 - 126 U/L 84   Total Bilirubin     0.3 - 1.2 mg/dL 1.4 (H)    The patient reports that she continues not to have any alcohol.  The patient also was found to have hepatitis C genotype 1a with a viral level of 1.1 million international units per milliliter of hepatitis C.  This was done back in 2019 prior to the patient falling off then a follow-up.  Past Medical History:  Diagnosis Date   Hypertension     Past Surgical History:  Procedure Laterality Date   IR THORACENTESIS ASP PLEURAL SPACE W/IMG GUIDE  10/27/2021    Prior to Admission medications   Medication Sig Start Date End Date Taking? Authorizing Provider  acetaminophen (TYLENOL) 500 MG tablet Take 500 mg by mouth daily.    [provider]  carvedilol (COREG) 6.25 MG tablet Take 1 tablet (6.25 mg total) by mouth 2 (two) times daily with a meal. 11/23/21 03/23/22  Mary Fendt, NP  folic acid (FOLVITE) 1 MG tablet Take 1 tablet (1 mg total) by mouth daily. 11/23/21 02/21/22  Mary Fendt, NP  furosemide (LASIX) 40 MG tablet Take 1 tablet (40 mg total) by mouth daily. 11/23/21 03/23/22  Mary Fendt, NP  hydrALAZINE (APRESOLINE) 25 MG tablet Take 1 tablet (25 mg total) by mouth 2 (two) times daily. 11/23/21 03/23/22  Mary Fendt, NP  ibuprofen (ADVIL,MOTRIN) 200 MG tablet Take 200-400 mg by mouth daily as needed (pain).    [provider]  promethazine-dextromethorphan (PROMETHAZINE-DM) 6.25-15 MG/5ML syrup Take 5 mLs by mouth 3 (three) times daily as needed for cough. 11/23/21   Mary Fendt, NP  spironolactone (ALDACTONE) 100 MG tablet Take 1 tablet (100 mg total) by mouth daily. 11/23/21 03/23/22  Mary Fendt, NP  thiamine 100 MG tablet Take 1 tablet (100 mg total) by mouth daily. 11/23/21 02/21/22  Mary Fendt, NP    Family History  Problem Relation Age of Onset   Hypertension Mother      Social History   Tobacco Use   Smoking status: Never    Passive exposure: Never    Smokeless tobacco: Never  Substance Use Topics   Alcohol use: Yes   Drug use: No    Allergies as of 12/05/2021   (No Known Allergies)    Review of Systems:    All systems reviewed and negative except where noted in HPI.   Physical Exam:  LMP 04/22/2011  Patient's last menstrual period was 04/22/2011. General:   Alert,  Well-developed, well-nourished, pleasant and cooperative in NAD Head:  Normocephalic and atraumatic. Eyes:  Sclera clear, no icterus.   Conjunctiva pink. Ears:  Normal auditory acuity. Neck:  Supple; no masses or thyromegaly. Lungs:  Respirations even and unlabored.  Clear throughout to auscultation.   No wheezes, crackles, or rhonchi. No acute distress. Heart:  Regular rate and rhythm; no murmurs, clicks, rubs, or gallops. Abdomen:  Normal bowel sounds.  No bruits.  Soft, non-tender and non-distended without masses, hepatosplenomegaly or hernias noted.  No guarding or rebound tenderness.  Negative Carnett sign.   Rectal:  Deferred.  Pulses:  Normal pulses noted. Extremities:  No clubbing or edema.  No cyanosis. Neurologic:  Alert and oriented x3;  grossly normal neurologically. Skin:  Intact without significant lesions or rashes.  No jaundice. Lymph Nodes:  No significant cervical adenopathy. Psych:  Alert and cooperative. Normal mood and affect.  Imaging Studies: No results found.  Assessment and Plan:   Mary Anderson is a 56 y.o. y/o female who comes in today with a history of hepatitis C and alcohol abuse who was recently in the hospital and diagnosed with decompensated liver cirrhosis.  The patient is in need of a hepatitis A and hepatitis B vaccine since she is not immune.  The patient has also been told that she needs to have a MRI of her liver since her alpha-fetoprotein was slightly elevated.  The patient will also be set up for an EGD to look for esophageal varices and initiate primary prophylaxis if the varices are present.  The patient has also been  told that she needs to be treated for her hepatitis C and due to her having decompensated liver disease with the ascites I have recommended she be sent to a tertiary care facility for treatment.  The patient has been explained the plan and agrees with it.    Mary Minium, MD. Clementeen Graham    Note: This dictation was  prepared with Dragon dictation along with smaller phrase technology. Any transcriptional errors that result from this process are unintentional.

## 2021-12-06 LAB — HCV RNA QUANT
HCV log10: 6.265 log10 IU/mL
Hepatitis C Quantitation: 1840000 IU/mL

## 2022-01-04 ENCOUNTER — Ambulatory Visit (INDEPENDENT_AMBULATORY_CARE_PROVIDER_SITE_OTHER): Payer: Self-pay | Admitting: Gastroenterology

## 2022-01-04 DIAGNOSIS — Z23 Encounter for immunization: Secondary | ICD-10-CM

## 2022-01-04 NOTE — Progress Notes (Signed)
Per orders of Dr. Servando Snare, injection of 2 of 3 Hep A/B in Left deltoid given by Roena Malady. Patient tolerated injection well.

## 2022-01-29 ENCOUNTER — Encounter: Payer: Self-pay | Admitting: Gastroenterology

## 2022-01-30 ENCOUNTER — Ambulatory Visit: Payer: Self-pay | Admitting: General Practice

## 2022-01-30 ENCOUNTER — Encounter: Payer: Self-pay | Admitting: Gastroenterology

## 2022-01-30 ENCOUNTER — Ambulatory Visit
Admission: RE | Admit: 2022-01-30 | Discharge: 2022-01-30 | Disposition: A | Discharge status: Home / Self Care | Payer: Self-pay | Source: Ambulatory Visit | Attending: Gastroenterology | Admitting: Gastroenterology

## 2022-01-30 ENCOUNTER — Encounter
Admission: RE | Disposition: A | Discharge status: Home / Self Care | Payer: Self-pay | Source: Ambulatory Visit | Attending: Gastroenterology

## 2022-01-30 DIAGNOSIS — K7460 Unspecified cirrhosis of liver: Secondary | ICD-10-CM | POA: Insufficient documentation

## 2022-01-30 HISTORY — PX: ESOPHAGOGASTRODUODENOSCOPY (EGD) WITH PROPOFOL: SHX5813

## 2022-01-30 SURGERY — ESOPHAGOGASTRODUODENOSCOPY (EGD) WITH PROPOFOL
Anesthesia: General

## 2022-01-30 MED ORDER — LIDOCAINE HCL (CARDIAC) PF 100 MG/5ML IV SOSY
PREFILLED_SYRINGE | INTRAVENOUS | Status: DC | PRN
  Administered 2022-01-30: 100 mg via INTRAVENOUS

## 2022-01-30 MED ORDER — PROPOFOL 10 MG/ML IV BOLUS
INTRAVENOUS | Status: DC | PRN
  Administered 2022-01-30: 100 mg via INTRAVENOUS
  Administered 2022-01-30: 30 mg via INTRAVENOUS

## 2022-01-30 MED ORDER — LIDOCAINE HCL (PF) 2 % IJ SOLN
INTRAMUSCULAR | Status: AC
  Filled 2022-01-30: qty 5

## 2022-01-30 MED ORDER — SODIUM CHLORIDE 0.9 % IV SOLN: INTRAVENOUS | Status: DC

## 2022-01-30 MED ORDER — LIDOCAINE HCL (PF) 2 % IJ SOLN
INTRAMUSCULAR | Status: AC
  Filled 2022-01-30: qty 15

## 2022-01-30 MED ORDER — PROPOFOL 1000 MG/100ML IV EMUL
INTRAVENOUS | Status: AC
  Filled 2022-01-30: qty 200

## 2022-01-30 NOTE — Transfer of Care (Signed)
Immediate Anesthesia Transfer of Care Note  Patient: [name]  Procedure(s) Performed: ESOPHAGOGASTRODUODENOSCOPY (EGD) WITH PROPOFOL  Patient Location: Endoscopy Unit  Anesthesia Type:General  Level of Consciousness: drowsy  Airway & Oxygen Therapy: Patient Spontanous Breathing and Patient connected to nasal cannula oxygen  Post-op Assessment: Report given to RN and Post -op Vital signs reviewed and stable  Post vital signs: Reviewed and stable  Last Vitals:  Vitals Value Taken Time  BP 138/71 01/30/22 0737  Temp 35.9 C 01/30/22 0737  Pulse 56 01/30/22 0738  Resp 14 01/30/22 0738  SpO2 99 % 01/30/22 0738  Vitals shown include unvalidated device data.  Last Pain:  Vitals:   01/30/22 0737  TempSrc: Temporal  PainSc: Asleep         Complications: No notable events documented.

## 2022-01-30 NOTE — Anesthesia Preprocedure Evaluation (Signed)
Anesthesia Evaluation  Patient identified by MRN, date of birth, ID band Patient awake    Reviewed: Allergy & Precautions, NPO status , Patient's Chart, lab work & pertinent test results  History of Anesthesia Complications Negative for: history of anesthetic complications  Airway Mallampati: III  TM Distance: >3 FB Neck ROM: full    Dental  (+) Poor Dentition   Pulmonary neg pulmonary ROS, neg shortness of breath, neg COPD, neg recent URI,    Pulmonary exam normal        Cardiovascular hypertension, (-) angina(-) CAD, (-) Past MI and (-) CABG negative cardio ROS Normal cardiovascular exam     Neuro/Psych negative neurological ROS  negative psych ROS   GI/Hepatic negative GI ROS, Neg liver ROS,   Endo/Other  negative endocrine ROS  Renal/GU negative Renal ROS  negative genitourinary   Musculoskeletal   Abdominal   Peds  Hematology negative hematology ROS (+)   Anesthesia Other Findings Past Medical History: No date: Hypertension  Past Surgical History: 10/27/2021: IR THORACENTESIS ASP PLEURAL SPACE W/IMG GUIDE  BMI    Body Mass Index: 27.12 kg/m      Reproductive/Obstetrics negative OB ROS                             Anesthesia Physical Anesthesia Plan  ASA: 2  Anesthesia Plan: General   Post-op Pain Management:    Induction: Intravenous  PONV Risk Score and Plan: 3 and Propofol infusion and TIVA  Airway Management Planned: Natural Airway and Nasal Cannula  Additional Equipment:   Intra-op Plan:   Post-operative Plan:   Informed Consent: I have reviewed the patients History and Physical, chart, labs and discussed the procedure including the risks, benefits and alternatives for the proposed anesthesia with the patient or authorized representative who has indicated his/her understanding and acceptance.     Dental Advisory Given  Plan Discussed with:  Anesthesiologist, CRNA and Surgeon  Anesthesia Plan Comments: (Patient consented for risks of anesthesia including but not limited to:  - adverse reactions to medications - risk of airway placement if required - damage to eyes, teeth, lips or other oral mucosa - nerve damage due to positioning  - sore throat or hoarseness - Damage to heart, brain, nerves, lungs, other parts of body or loss of life  Patient voiced understanding.)        Anesthesia Quick Evaluation

## 2022-01-30 NOTE — Op Note (Signed)
[facility] Patient Name: Mary Anderson Procedure Date: 01/30/2022 7:16 AM MRN: [id number] Account #: [id number] Date of Birth: January 19, 1966 Admit Type: Outpatient Age: 56 Room: [facility] ENDO ROOM 4 Gender: Female Note Status: Finalized Instrument Name: Upper Endoscope [id number] Procedure:             Upper GI endoscopy Indications:           Cirrhosis rule out esophageal varices Providers:             Midge Minium MD, MD Referring MD:          Zonia Kief, Amy Medicines:             Propofol per Anesthesia Complications:         No immediate complications. Procedure:             Pre-Anesthesia Assessment:                        - Prior to the procedure, a History and Physical was                         performed, and patient medications and allergies were                         reviewed. The patient's tolerance of previous                         anesthesia was also reviewed. The risks and benefits                         of the procedure and the sedation options and risks                         were discussed with the patient. All questions were                         answered, and informed consent was obtained. Prior                         Anticoagulants: The patient has taken no previous                         anticoagulant or antiplatelet agents. ASA Grade                         Assessment: II - A patient with mild systemic disease.                         After reviewing the risks and benefits, the patient                         was deemed in satisfactory condition to undergo the                         procedure.                        After obtaining informed consent, the endoscope was  passed under direct vision. Throughout the procedure,                         the patient's blood pressure, pulse, and oxygen                         saturations were monitored continuously. The Endoscope                         was  introduced through the mouth, and advanced to the                         second part of duodenum. The upper GI endoscopy was                         accomplished without difficulty. The patient tolerated                         the procedure well. Findings:      The examined esophagus was normal.      The entire examined stomach was normal.      The examined duodenum was normal. Impression:            - Normal esophagus.                        - Normal stomach.                        - Normal examined duodenum.                        - No specimens collected. Recommendation:        - Discharge patient to home.                        - Resume previous diet.                        - Continue present medications.                        - Repeat upper endoscopy in 2 years for surveillance. Procedure Code(s):     --- Professional ---                        [phone], Esophagogastroduodenoscopy, flexible,                         transoral; diagnostic, including collection of                         specimen(s) by brushing or washing, when performed                         (separate procedure) Diagnosis Code(s):     --- Professional ---                        K74.60, Unspecified cirrhosis of liver CPT copyright 2019 American Medical Association. All rights reserved. The codes documented in this report are preliminary and upon coder review may  be  revised to meet current compliance requirements. Midge Minium MD, MD 01/30/2022 7:35:02 AM This report has been signed electronically. Number of Addenda: 0 Note Initiated On: 01/30/2022 7:16 AM Estimated Blood Loss:  Estimated blood loss: none.      [facility]

## 2022-01-30 NOTE — Anesthesia Postprocedure Evaluation (Signed)
Anesthesia Post Note  Patient: Mary Anderson  Procedure(s) Performed: ESOPHAGOGASTRODUODENOSCOPY (EGD) WITH PROPOFOL  Patient location during evaluation: Endoscopy Anesthesia Type: General Level of consciousness: awake and alert Pain management: pain level controlled Vital Signs Assessment: post-procedure vital signs reviewed and stable Respiratory status: spontaneous breathing, nonlabored ventilation, respiratory function stable and patient connected to nasal cannula oxygen Cardiovascular status: blood pressure returned to baseline and stable Postop Assessment: no apparent nausea or vomiting Anesthetic complications: no   No notable events documented.   Last Vitals:  Vitals:   01/30/22 0747 01/30/22 0757  BP: (!) 157/61 136/85  Pulse:    Resp:    Temp:    SpO2:      Last Pain:  Vitals:   01/30/22 0757  TempSrc:   PainSc: 0-No pain                 Stephanie Coup

## 2022-01-30 NOTE — H&P (Signed)
Midge Minium, MD [facility] [address], [address] [place], [place] [address] [phone] Fax : [phone]  Primary Care Physician:  [name], NP Primary Gastroenterologist:  Dr. Servando Snare  Pre-Procedure History & Physical: HPI:  Mary Anderson is a 56 y.o. female is here for an endoscopy.   Past Medical History:  Diagnosis Date   Hypertension     Past Surgical History:  Procedure Laterality Date   IR THORACENTESIS ASP PLEURAL SPACE W/IMG GUIDE  10/27/2021    Prior to Admission medications   Medication Sig Start Date End Date Taking? Authorizing Provider  acetaminophen (TYLENOL) 500 MG tablet Take 500 mg by mouth daily.   Yes [provider]  carvedilol (COREG) 6.25 MG tablet Take 1 tablet (6.25 mg total) by mouth 2 (two) times daily with a meal. 11/23/21 03/23/22 Yes Zonia Kief, Amy J, NP  furosemide (LASIX) 40 MG tablet Take 1 tablet (40 mg total) by mouth daily. 11/23/21 03/23/22 Yes Zonia Kief, Amy J, NP  hydrALAZINE (APRESOLINE) 25 MG tablet Take 1 tablet (25 mg total) by mouth 2 (two) times daily. 11/23/21 03/23/22 Yes Zonia Kief, Amy J, NP  spironolactone (ALDACTONE) 100 MG tablet Take 1 tablet (100 mg total) by mouth daily. 11/23/21 03/23/22 Yes Zonia Kief, Amy J, NP  folic acid (FOLVITE) 1 MG tablet Take 1 tablet (1 mg total) by mouth daily. 11/23/21 02/21/22  [name], NP  ibuprofen (ADVIL,MOTRIN) 200 MG tablet Take 200-400 mg by mouth daily as needed (pain). Patient not taking: Reported on 01/30/2022    [provider]  promethazine-dextromethorphan (PROMETHAZINE-DM) 6.25-15 MG/5ML syrup Take 5 mLs by mouth 3 (three) times daily as needed for cough. 11/23/21   [name], NP  thiamine 100 MG tablet Take 1 tablet (100 mg total) by mouth daily. 11/23/21 02/21/22  [name], NP    Allergies as of 12/05/2021   (No Known Allergies)    Family History  Problem Relation Age of Onset   Hypertension Mother     Social History   Socioeconomic History    Marital status: Married    Spouse name: Not on file   Number of children: Not on file   Years of education: Not on file   Highest education level: Not on file  Occupational History   Not on file  Tobacco Use   Smoking status: Never    Passive exposure: Never   Smokeless tobacco: Never  Vaping Use   Vaping Use: Never used  Substance and Sexual Activity   Alcohol use: Not Currently   Drug use: No   Sexual activity: Not on file  Other Topics Concern   Not on file  Social History Narrative   Not on file   Social Determinants of Health   Financial Resource Strain: Not on file  Food Insecurity: Not on file  Transportation Needs: Not on file  Physical Activity: Not on file  Stress: Not on file  Social Connections: Not on file  Intimate Partner Violence: Not on file    Review of Systems: See HPI, otherwise negative ROS  Physical Exam: BP (!) 173/83   Pulse (!) 53   Temp (!) 97.2 F (36.2 C) (Temporal)   Resp 18   Ht 5\' 4"  (1.626 m)   LMP 04/22/2011   SpO2 100%   BMI 27.12 kg/m  General:   Alert,  pleasant and cooperative in NAD Head:  Normocephalic and atraumatic. Neck:  Supple; no masses or thyromegaly. Lungs:  Clear throughout to auscultation.  Heart:  Regular rate and rhythm. Abdomen:  Soft, nontender and nondistended. Normal bowel sounds, without guarding, and without rebound.   Neurologic:  Alert and  oriented x4;  grossly normal neurologically.  Impression/Plan: Mary Anderson is here for an endoscopy to be performed for cirrhosis  Risks, benefits, limitations, and alternatives regarding  endoscopy have been reviewed with the patient.  Questions have been answered.  All parties agreeable.   Midge Minium, MD  01/30/2022, 7:22 AM

## 2022-01-31 ENCOUNTER — Telehealth: Payer: Self-pay

## 2022-01-31 ENCOUNTER — Other Ambulatory Visit: Payer: Self-pay | Admitting: Family

## 2022-01-31 ENCOUNTER — Encounter: Payer: Self-pay | Admitting: Gastroenterology

## 2022-01-31 DIAGNOSIS — R772 Abnormality of alphafetoprotein: Secondary | ICD-10-CM

## 2022-01-31 DIAGNOSIS — R059 Cough, unspecified: Secondary | ICD-10-CM

## 2022-01-31 DIAGNOSIS — K7460 Unspecified cirrhosis of liver: Secondary | ICD-10-CM

## 2022-01-31 NOTE — Telephone Encounter (Signed)
Order complete. 

## 2022-01-31 NOTE — Telephone Encounter (Signed)
This patient needs an MRI of the liver for increased alpha feto protein and cirrhosis

## 2022-02-08 NOTE — Telephone Encounter (Signed)
Ordered will call to schedule Monday--pt is aware

## 2022-02-16 ENCOUNTER — Encounter: Payer: Self-pay | Admitting: Gastroenterology

## 2022-02-20 ENCOUNTER — Other Ambulatory Visit: Payer: Self-pay | Admitting: Family

## 2022-02-20 DIAGNOSIS — D539 Nutritional anemia, unspecified: Secondary | ICD-10-CM

## 2022-03-02 ENCOUNTER — Ambulatory Visit
Admission: RE | Admit: 2022-03-02 | Discharge: 2022-03-02 | Disposition: A | Discharge status: Home / Self Care | Payer: Self-pay | Source: Ambulatory Visit | Attending: Gastroenterology | Admitting: Gastroenterology

## 2022-03-02 DIAGNOSIS — K7460 Unspecified cirrhosis of liver: Secondary | ICD-10-CM | POA: Insufficient documentation

## 2022-03-02 DIAGNOSIS — R772 Abnormality of alphafetoprotein: Secondary | ICD-10-CM | POA: Insufficient documentation

## 2022-03-02 MED ORDER — GADOBUTROL 1 MMOL/ML IV SOLN
7.0000 mL | Freq: Once | INTRAVENOUS | Status: AC | PRN
  Administered 2022-03-02: 7 mL via INTRAVENOUS

## 2022-03-06 ENCOUNTER — Encounter: Payer: Self-pay | Admitting: Gastroenterology

## 2022-03-06 DIAGNOSIS — K769 Liver disease, unspecified: Secondary | ICD-10-CM

## 2022-03-06 DIAGNOSIS — R772 Abnormality of alphafetoprotein: Secondary | ICD-10-CM

## 2022-03-09 NOTE — Addendum Note (Signed)
Addended by: Roena Malady on: 03/09/2022 02:28 PM   Modules accepted: Orders

## 2022-03-12 NOTE — Progress Notes (Signed)
Patient ID: Mary Anderson, female    DOB: 1965/12/25  MRN: [id number]  CC: Cough  Subjective: Mary Anderson is a 56 y.o. female who presents for cough.   Her concerns today include:  - Productive cough with yellow mucus x 3 weeks persisting. Denies shortness of breath, chest pain, and additional red flag symptoms. Endorses stomach muscles hurt from constant coughing. Has tried several over-the-counter medications without relief. Requests refill of Promethazine-DM. Home Covid test negative. She does not smoke and does not have a history of smoking.  - Doing well on blood pressure medications without issues or concerns.  - Reports Gastroenterology attempted to reach her for an appointment but she missed the call. States she will return their call to schedule appointment.   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     folic acid (FOLVITE) 1 MG tablet TAKE 1 TABLET BY MOUTH EVERY DAY 90 tablet 0   ibuprofen (ADVIL,MOTRIN) 200 MG tablet Take 200-400 mg by mouth daily as needed (pain). (Patient not taking: Reported on 01/30/2022)     No current facility-administered medications on file prior to visit.    No Known Allergies  Social History   Socioeconomic History   Marital status: Married    Spouse  name: Not on file   Number of children: Not on file   Years of education: Not on file   Highest education level: Not on file  Occupational History   Not on file  Tobacco Use   Smoking status: Never    Passive exposure: Never   Smokeless tobacco: Never  Vaping Use   Vaping Use: Never used  Substance and Sexual Activity   Alcohol use: Not Currently   Drug use: No   Sexual activity: Not on file  Other Topics Concern   Not on file  Social History Narrative   Not on file   Social Determinants of Health   Financial Resource Strain: Not on file  Food Insecurity: Not on file  Transportation Needs: Not on file  Physical Activity: Not on file  Stress: Not on file  Social Connections: Not on file  Intimate Partner Violence: Not on file    Family History  Problem Relation Age of Onset   Hypertension Mother     Past Surgical History:  Procedure Laterality Date   ESOPHAGOGASTRODUODENOSCOPY (EGD) WITH PROPOFOL N/A 01/30/2022   Procedure: ESOPHAGOGASTRODUODENOSCOPY (EGD) WITH PROPOFOL;  Surgeon: Midge Minium, MD;  Location: [facility] ENDOSCOPY;  Service: Endoscopy;  Laterality: N/A;   IR THORACENTESIS ASP PLEURAL SPACE W/IMG GUIDE  10/27/2021    ROS: Review of Systems Negative except as stated above  PHYSICAL EXAM: BP 114/71 (BP Location: Left Arm, Patient Position: Sitting, Cuff Size: Large)   Pulse (!) 59   Temp 98.3 F (36.8 C)   Resp 16  Ht 5' 4.02" (1.626 m)   Wt 197 lb (89.4 kg)   LMP 04/22/2011   SpO2 98%   BMI 33.80 kg/m   Physical Exam HENT:     Head: Normocephalic and atraumatic.     Mouth/Throat:     Mouth: Mucous membranes are moist.     Pharynx: Oropharynx is clear.  Eyes:     Extraocular Movements: Extraocular movements intact.     Conjunctiva/sclera: Conjunctivae normal.     Pupils: Pupils are equal, round, and reactive to light.  Cardiovascular:     Rate and Rhythm: Bradycardia present.     Pulses: Normal pulses.     Heart sounds: Normal heart  sounds.  Pulmonary:     Effort: Pulmonary effort is normal.     Breath sounds: Normal breath sounds.  Musculoskeletal:     Cervical back: Normal range of motion and neck supple.  Neurological:     General: No focal deficit present.     Mental Status: She is alert and oriented to person, place, and time.  Psychiatric:        Mood and Affect: Mood normal.        Behavior: Behavior normal.     ASSESSMENT AND PLAN: Patient scheduled for a physical exam today. During patient rooming she stated to CMA that she does not want a physical exam and that she is here to discuss her cough. When I entered the room I confirmed with patient report from CMA and she agreed this is correct.   1. Chronic cough 2. Symptoms of upper respiratory infection (URI) - Patient today in office without cardiopulmonary distress.  - Promethazine-Dextromethorphan, Prednisone, and Amoxicillin-Clavulanate as prescribed.  - Respiratory panel for further evaluation.  - Diagnostic chest x-ray for further evaluation.  - Referral to Pulmonology for further evaluation and management.  - promethazine-dextromethorphan (PROMETHAZINE-DM) 6.25-15 MG/5ML syrup; Take 5 mLs by mouth 3 (three) times daily as needed for cough.  Dispense: 240 mL; Refill: 0 - Ambulatory referral to Pulmonology - predniSONE (DELTASONE) 10 MG tablet; Take 6 tablets (60 mg total) by mouth daily with breakfast for 1 day, THEN 5 tablets (50 mg total) daily with breakfast for 1 day, THEN 4 tablets (40 mg total) daily with breakfast for 1 day, THEN 3 tablets (30 mg total) daily with breakfast for 1 day, THEN 2 tablets (20 mg total) daily with breakfast for 1 day, THEN 1 tablet (10 mg total) daily with breakfast for 1 day.  Dispense: 21 tablet; Refill: 0 - amoxicillin-clavulanate (AUGMENTIN) 875-125 MG tablet; Take 1 tablet by mouth 2 (two) times daily for 7 days.  Dispense: 14 tablet; Refill: 0 - DG Chest 2 View; Future - Resp panel by RT-PCR (RSV, Flu A&B, Covid)  Anterior Nasal Swab  3. Primary hypertension - Continue Carvedilol, Furosemide, Hydralazine, and Spironolactone as prescribed.  - Counseled on blood pressure goal of less than 130/80, low-sodium, DASH diet, medication compliance, and 150 minutes of moderate intensity exercise per week as tolerated. Counseled on medication adherence and adverse effects. - Follow-up with primary provider in 3 months or sooner if needed.  - carvedilol (COREG) 6.25 MG tablet; Take 1 tablet (6.25 mg total) by mouth 2 (two) times daily with a meal.  Dispense: 60 tablet; Refill: 2 - furosemide (LASIX) 40 MG tablet; Take 1 tablet (40 mg total) by mouth daily.  Dispense: 30 tablet; Refill: 2 - hydrALAZINE (APRESOLINE) 25 MG tablet; Take 1 tablet (25 mg total) by mouth 2 (two) times daily.  Dispense: 60 tablet; Refill: 2 - spironolactone (ALDACTONE) 100 MG tablet; Take 1 tablet (100 mg total) by mouth daily.  Dispense: 30 tablet; Refill: 2  4. Alcoholic cirrhosis of liver with ascites (HCC) 5. Chronic hepatitis C without hepatic coma (HCC) 6. Anasarca 7. Elevated LFTs 8. Thrombocytopenia (HCC) - Continue Furosemide and Spironolactone as prescribed.  - Today patient reports Gastroenterology attempted to call her to schedule an appointment and she missed the call. States she will return their call to schedule appointment.  - furosemide (LASIX) 40 MG tablet; Take 1 tablet (40 mg total) by mouth daily.  Dispense: 30 tablet; Refill: 2 - spironolactone (ALDACTONE) 100 MG tablet; Take 1 tablet (100 mg total) by mouth daily.  Dispense: 30 tablet; Refill: 2  9. Financial difficulties - Offered patient [place] financial discount/orange card. Counseled patient will need to mail in completed application or schedule an appointment with financial counselor for processing. Patient agreeable.    Patient was given the opportunity to ask questions.  Patient verbalized understanding of the plan and was able to repeat key elements of  the plan. Patient was given clear instructions to go to Emergency Department or return to medical center if symptoms don't improve, worsen, or new problems develop.The patient verbalized understanding.   Orders Placed This Encounter  Procedures   Resp panel by RT-PCR (RSV, Flu A&B, Covid) Anterior Nasal Swab   DG Chest 2 View   Ambulatory referral to Pulmonology     Requested Prescriptions   Signed Prescriptions Disp Refills   promethazine-dextromethorphan (PROMETHAZINE-DM) 6.25-15 MG/5ML syrup 240 mL 0    Sig: Take 5 mLs by mouth 3 (three) times daily as needed for cough.   predniSONE (DELTASONE) 10 MG tablet 21 tablet 0    Sig: Take 6 tablets (60 mg total) by mouth daily with breakfast for 1 day, THEN 5 tablets (50 mg total) daily with breakfast for 1 day, THEN 4 tablets (40 mg total) daily with breakfast for 1 day, THEN 3 tablets (30 mg total) daily with breakfast for 1 day, THEN 2 tablets (20 mg total) daily with breakfast for 1 day, THEN 1 tablet (10 mg total) daily with breakfast for 1 day.   amoxicillin-clavulanate (AUGMENTIN) 875-125 MG tablet 14 tablet 0    Sig: Take 1 tablet by mouth 2 (two) times daily for 7 days.   carvedilol (COREG) 6.25 MG tablet 60 tablet 2    Sig: Take 1 tablet (6.25 mg total) by mouth 2 (two) times daily with a meal.   furosemide (LASIX) 40 MG tablet 30 tablet 2    Sig: Take 1 tablet (40 mg total) by mouth daily.   hydrALAZINE (APRESOLINE) 25 MG tablet 60 tablet 2    Sig: Take 1 tablet (25 mg total) by mouth 2 (two) times daily.   spironolactone (ALDACTONE) 100 MG tablet 30 tablet 2    Sig: Take 1 tablet (100 mg total) by mouth daily.    Return in about 1 year (around 03/27/2023) for Physical per patient preference, Follow-Up or next available 3 mon chronic care mgmt; needs [facility] app.  [name], NP

## 2022-03-13 ENCOUNTER — Other Ambulatory Visit: Payer: Self-pay | Admitting: Family

## 2022-03-13 DIAGNOSIS — R059 Cough, unspecified: Secondary | ICD-10-CM

## 2022-03-17 ENCOUNTER — Other Ambulatory Visit: Payer: Self-pay | Admitting: Family

## 2022-03-17 DIAGNOSIS — R059 Cough, unspecified: Secondary | ICD-10-CM

## 2022-03-17 LAB — AFP TUMOR MARKER: AFP, Serum, Tumor Marker: 18 ng/mL — ABNORMAL HIGH (ref 0.0–9.2)

## 2022-03-19 NOTE — Telephone Encounter (Signed)
Requested medication (s) are due for refill today: review  Requested medication (s) are on the active medication list: yes  Last refill:  01/31/22 #243mL/0  Future visit scheduled: yes 03/26/22  Notes to clinic:  please review for refill. Pt requesting prior to appt on 03/26/22     Requested Prescriptions  Pending Prescriptions Disp Refills   promethazine-dextromethorphan (PROMETHAZINE-DM) 6.25-15 MG/5ML syrup [Pharmacy Med Name: PROMETHAZINE-DM 6.25-15 MG/5ML] 240 mL 0    Sig: TAKE 5 MLS BY MOUTH 3 TIMES A DAY AS NEEDED FOR COUGH     Ear, Nose, and Throat:  Antitussives/Expectorants Passed - 03/17/2022  4:34 PM      Passed - Valid encounter within last 12 months    Recent Outpatient Visits           3 months ago Encounter to establish care   Primary Care at [facility], Flonnie Hailstone, NP       Future Appointments             In 1 week Camillia Herter, NP Primary Care at [facility]   In 2 months  [place]

## 2022-03-20 NOTE — Addendum Note (Signed)
Addended by: Lurlean Nanny on: 03/20/2022 05:26 PM   Modules accepted: Orders

## 2022-03-22 ENCOUNTER — Other Ambulatory Visit: Payer: Self-pay | Admitting: Family

## 2022-03-22 DIAGNOSIS — R059 Cough, unspecified: Secondary | ICD-10-CM

## 2022-03-23 ENCOUNTER — Other Ambulatory Visit: Payer: Self-pay | Admitting: Family

## 2022-03-23 DIAGNOSIS — R059 Cough, unspecified: Secondary | ICD-10-CM

## 2022-03-26 ENCOUNTER — Ambulatory Visit (INDEPENDENT_AMBULATORY_CARE_PROVIDER_SITE_OTHER): Payer: Self-pay

## 2022-03-26 ENCOUNTER — Other Ambulatory Visit: Payer: Self-pay | Admitting: Family

## 2022-03-26 ENCOUNTER — Other Ambulatory Visit (HOSPITAL_COMMUNITY)
Admission: RE | Admit: 2022-03-26 | Discharge: 2022-03-26 | Disposition: A | Discharge status: Home / Self Care | Payer: Self-pay | Attending: Family | Admitting: Family

## 2022-03-26 ENCOUNTER — Ambulatory Visit (INDEPENDENT_AMBULATORY_CARE_PROVIDER_SITE_OTHER): Payer: Self-pay | Admitting: Family

## 2022-03-26 VITALS — BP 114/71 | HR 59 | Temp 98.3°F | Resp 16 | Ht 64.02 in | Wt 197.0 lb

## 2022-03-26 DIAGNOSIS — B182 Chronic viral hepatitis C: Secondary | ICD-10-CM

## 2022-03-26 DIAGNOSIS — Z1211 Encounter for screening for malignant neoplasm of colon: Secondary | ICD-10-CM

## 2022-03-26 DIAGNOSIS — I10 Essential (primary) hypertension: Secondary | ICD-10-CM

## 2022-03-26 DIAGNOSIS — Z113 Encounter for screening for infections with a predominantly sexual mode of transmission: Secondary | ICD-10-CM

## 2022-03-26 DIAGNOSIS — R0989 Other specified symptoms and signs involving the circulatory and respiratory systems: Secondary | ICD-10-CM

## 2022-03-26 DIAGNOSIS — K7031 Alcoholic cirrhosis of liver with ascites: Secondary | ICD-10-CM

## 2022-03-26 DIAGNOSIS — Z131 Encounter for screening for diabetes mellitus: Secondary | ICD-10-CM

## 2022-03-26 DIAGNOSIS — Z599 Problem related to housing and economic circumstances, unspecified: Secondary | ICD-10-CM

## 2022-03-26 DIAGNOSIS — Z13220 Encounter for screening for lipoid disorders: Secondary | ICD-10-CM

## 2022-03-26 DIAGNOSIS — R053 Chronic cough: Secondary | ICD-10-CM | POA: Insufficient documentation

## 2022-03-26 DIAGNOSIS — Z0000 Encounter for general adult medical examination without abnormal findings: Secondary | ICD-10-CM

## 2022-03-26 DIAGNOSIS — Z1329 Encounter for screening for other suspected endocrine disorder: Secondary | ICD-10-CM

## 2022-03-26 DIAGNOSIS — Z20822 Contact with and (suspected) exposure to covid-19: Secondary | ICD-10-CM | POA: Insufficient documentation

## 2022-03-26 DIAGNOSIS — D696 Thrombocytopenia, unspecified: Secondary | ICD-10-CM

## 2022-03-26 DIAGNOSIS — D539 Nutritional anemia, unspecified: Secondary | ICD-10-CM

## 2022-03-26 DIAGNOSIS — R601 Generalized edema: Secondary | ICD-10-CM

## 2022-03-26 DIAGNOSIS — M419 Scoliosis, unspecified: Secondary | ICD-10-CM

## 2022-03-26 DIAGNOSIS — R7989 Other specified abnormal findings of blood chemistry: Secondary | ICD-10-CM

## 2022-03-26 DIAGNOSIS — Z124 Encounter for screening for malignant neoplasm of cervix: Secondary | ICD-10-CM

## 2022-03-26 DIAGNOSIS — Z1231 Encounter for screening mammogram for malignant neoplasm of breast: Secondary | ICD-10-CM

## 2022-03-26 LAB — RESP PANEL BY RT-PCR (RSV, FLU A&B, COVID)  RVPGX2
Influenza A by PCR: NEGATIVE
Influenza B by PCR: NEGATIVE
Resp Syncytial Virus by PCR: NEGATIVE
SARS Coronavirus 2 by RT PCR: NEGATIVE

## 2022-03-26 MED ORDER — FUROSEMIDE 40 MG PO TABS
40.0000 mg | ORAL_TABLET | Freq: Every day | ORAL | 2 refills | Status: DC
Start: 2022-03-26 — End: 2022-05-01

## 2022-03-26 MED ORDER — AMOXICILLIN-POT CLAVULANATE 875-125 MG PO TABS: 1.0000 | ORAL_TABLET | Freq: Two times a day (BID) | ORAL | 0 refills | Status: AC

## 2022-03-26 MED ORDER — SPIRONOLACTONE 100 MG PO TABS: 100.0000 mg | ORAL_TABLET | Freq: Every day | ORAL | 2 refills | Status: DC

## 2022-03-26 MED ORDER — HYDRALAZINE HCL 25 MG PO TABS: 25.0000 mg | ORAL_TABLET | Freq: Two times a day (BID) | ORAL | 2 refills | Status: DC

## 2022-03-26 MED ORDER — PROMETHAZINE-DM 6.25-15 MG/5ML PO SYRP: 5.0000 mL | ORAL_SOLUTION | Freq: Three times a day (TID) | ORAL | 0 refills | Status: DC | PRN

## 2022-03-26 MED ORDER — CARVEDILOL 6.25 MG PO TABS: 6.2500 mg | ORAL_TABLET | Freq: Two times a day (BID) | ORAL | 2 refills | Status: DC

## 2022-03-26 MED ORDER — PREDNISONE 10 MG PO TABS: ORAL_TABLET | ORAL | 0 refills | Status: AC

## 2022-03-26 NOTE — Progress Notes (Signed)
Pt presents for chest congestion and cold symptoms declined physical  -cough w/productive  -going on for 3 weeks  -wants previously prescribed on 08/02 cough syrup

## 2022-03-26 NOTE — Patient Instructions (Addendum)

## 2022-03-30 ENCOUNTER — Encounter: Payer: Self-pay | Admitting: Oncology

## 2022-03-30 ENCOUNTER — Inpatient Hospital Stay: Payer: Self-pay

## 2022-03-30 ENCOUNTER — Inpatient Hospital Stay: Payer: Self-pay | Attending: Oncology | Admitting: Oncology

## 2022-03-30 VITALS — BP 152/85 | HR 55 | Temp 98.0°F | Wt 169.0 lb

## 2022-03-30 DIAGNOSIS — R161 Splenomegaly, not elsewhere classified: Secondary | ICD-10-CM

## 2022-03-30 DIAGNOSIS — D61818 Other pancytopenia: Secondary | ICD-10-CM

## 2022-03-30 DIAGNOSIS — K7460 Unspecified cirrhosis of liver: Secondary | ICD-10-CM

## 2022-03-30 DIAGNOSIS — K766 Portal hypertension: Secondary | ICD-10-CM

## 2022-03-30 DIAGNOSIS — K769 Liver disease, unspecified: Secondary | ICD-10-CM

## 2022-03-30 NOTE — Progress Notes (Signed)
Hematology/Oncology Consult note [facility] [phone] [phone]  Patient Care Team: [name], NP as PCP - General (Nurse Practitioner)   Name of the patient: Mary Anderson  [id number]  30-Jan-1966    Reason for referral-liver lesion in the setting of cirrhosis   Referring physician-Dr. Servando Snare  Date of visit: 03/30/22   History of presenting illness- Patient is a 56 year old female with a past medical history significant for 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  She has been getting HCC surveillance by Dr. Servando Snare.  She had an MRI liver with and without contrast in September 2023 and was found to have a 64mm focus of subcapsular enhancement in the posterior right liver with no definitive characteristics of washout.  This may reflect transient hepatic intensity difference potentially related to underlying vascular malformation.  However her AFP was found to be elevated at 18 and therefore patient has been referred for further management.  ECOG PS- 1  Pain scale- 0   Review of systems- Review of Systems  Constitutional:  Positive for malaise/fatigue. Negative for chills, fever and weight loss.  HENT:  Negative for congestion, ear discharge and nosebleeds.   Eyes:  Negative for blurred vision.  Respiratory:  Negative for cough, hemoptysis, sputum production, shortness of breath and wheezing.   Cardiovascular:  Negative for chest pain, palpitations, orthopnea and claudication.  Gastrointestinal:  Negative for abdominal pain, blood in stool, constipation, diarrhea, heartburn, melena, nausea and vomiting.  Genitourinary:  Negative for dysuria, flank pain, frequency, hematuria and urgency.  Musculoskeletal:  Negative for back pain, joint pain and myalgias.  Skin:  Negative for rash.  Neurological:  Negative for dizziness, tingling, focal weakness, seizures, weakness and headaches.  Endo/Heme/Allergies:  Does not  bruise/bleed easily.  Psychiatric/Behavioral:  Negative for depression and suicidal ideas. The patient does not have insomnia.     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 10/26/2021   Cirrhosis (HCC) 09/11/2017   Splenomegaly 09/11/2017   Thrombocytopenia (HCC) 09/11/2017   Calculus of gallbladder without cholecystitis without obstruction 09/10/2017   Chronic hepatitis C (HCC) 08/30/2017   Hypertension 07/31/2017     Past Medical History:  Diagnosis Date   Hypertension      Past Surgical History:  Procedure Laterality Date   ESOPHAGOGASTRODUODENOSCOPY (EGD) WITH PROPOFOL N/A 01/30/2022   Procedure: ESOPHAGOGASTRODUODENOSCOPY (EGD) WITH PROPOFOL;  Surgeon: Midge Minium, MD;  Location: [facility] ENDOSCOPY;  Service: Endoscopy;  Laterality: N/A;   IR THORACENTESIS ASP PLEURAL SPACE W/IMG GUIDE  10/27/2021    Social History   Socioeconomic History   Marital status: Married    Spouse name: Not on file   Number of children: Not on file   Years of education: Not on file   Highest education level: Not on file  Occupational History   Not on file  Tobacco Use   Smoking status: Never    Passive exposure: Never   Smokeless tobacco: Never  Vaping Use   Vaping Use: Never used  Substance and Sexual Activity   Alcohol use: Not Currently    Comment: no alcohol the past 6 months.   Drug use: No   Sexual activity: Not on  file  Other Topics Concern   Not on file  Social History Narrative   Not on file   Social Determinants of Health   Financial Resource Strain: Not on file  Food Insecurity: Not on file  Transportation Needs: Not on file  Physical Activity: Not on file   Stress: Not on file  Social Connections: Not on file  Intimate Partner Violence: Not on file     Family History  Problem Relation Age of Onset   Hypertension Mother    Colon cancer Mother    Hypertension Brother    Cancer Maternal Uncle    Cancer Maternal Uncle    Clotting disorder Neg Hx    Diabetes Neg Hx      Current Outpatient Medications:    amoxicillin-clavulanate (AUGMENTIN) 875-125 MG tablet, Take 1 tablet by mouth 2 (two) times daily for 7 days., Disp: 14 tablet, Rfl: 0   carvedilol (COREG) 6.25 MG tablet, Take 1 tablet (6.25 mg total) by mouth 2 (two) times daily with a meal., Disp: 60 tablet, Rfl: 2   folic acid (FOLVITE) 1 MG tablet, TAKE 1 TABLET BY MOUTH EVERY DAY, Disp: 90 tablet, Rfl: 0   furosemide (LASIX) 40 MG tablet, Take 1 tablet (40 mg total) by mouth daily., Disp: 30 tablet, Rfl: 2   hydrALAZINE (APRESOLINE) 25 MG tablet, Take 1 tablet (25 mg total) by mouth 2 (two) times daily., Disp: 60 tablet, Rfl: 2   predniSONE (DELTASONE) 10 MG tablet, Take 6 tablets (60 mg total) by mouth daily with breakfast for 1 day, THEN 5 tablets (50 mg total) daily with breakfast for 1 day, THEN 4 tablets (40 mg total) daily with breakfast for 1 day, THEN 3 tablets (30 mg total) daily with breakfast for 1 day, THEN 2 tablets (20 mg total) daily with breakfast for 1 day, THEN 1 tablet (10 mg total) daily with breakfast for 1 day., Disp: 21 tablet, Rfl: 0   promethazine-dextromethorphan (PROMETHAZINE-DM) 6.25-15 MG/5ML syrup, Take 5 mLs by mouth 3 (three) times daily as needed for cough., Disp: 240 mL, Rfl: 0   spironolactone (ALDACTONE) 100 MG tablet, Take 1 tablet (100 mg total) by mouth daily., Disp: 30 tablet, Rfl: 2   acetaminophen (TYLENOL) 500 MG tablet, Take 500 mg by mouth daily., Disp: , Rfl:    ibuprofen (ADVIL,MOTRIN) 200 MG tablet, Take 200-400 mg by mouth daily as needed (pain). (Patient not taking: Reported on 01/30/2022), Disp: , Rfl:    Physical exam:  Vitals:    03/30/22 1042  BP: (!) 152/85  Pulse: (!) 55  Temp: 98 F (36.7 C)  SpO2: 99%  Weight: 169 lb (76.7 kg)   Physical Exam Constitutional:      General: She is not in acute distress. Cardiovascular:     Rate and Rhythm: Normal rate and regular rhythm.     Heart sounds: Normal heart sounds.  Pulmonary:     Effort: Pulmonary effort is normal.     Breath sounds: Normal breath sounds.  Abdominal:     General: Bowel sounds are normal.     Palpations: Abdomen is soft.  Skin:    General: Skin is warm and dry.  Neurological:     Mental Status: She is alert and oriented to person, place, and time.           Latest Ref Rng & Units 11/23/2021   10:31 AM  CMP  Glucose 70 - 99 mg/dL 236   BUN 6 - 24 mg/dL 17  Creatinine 0.57 - 1.00 mg/dL 2.22   Sodium 979 - 892 mmol/L 134   Potassium 3.5 - 5.2 mmol/L 4.1   Chloride 96 - 106 mmol/L 99   CO2 20 - 29 mmol/L 24   Calcium 8.7 - 10.2 mg/dL 8.6       Latest Ref Rng & Units 11/23/2021   10:31 AM  CBC  WBC 3.4 - 10.8 x10E3/uL 3.0   Hemoglobin 11.1 - 15.9 g/dL 11.9   Hematocrit 41.7 - 46.6 % 34.0   Platelets 150 - 450 x10E3/uL 72     No images are attached to the encounter.  DG Chest 2 View  Result Date: 03/26/2022 CLINICAL DATA:  Chronic cough. EXAM: CHEST - 2 VIEW COMPARISON:  10/29/2021 FINDINGS: The heart size and mediastinal contours are within normal limits. Both lungs are clear. Mild thoracic dextroscoliosis again noted. IMPRESSION: No active cardiopulmonary disease. Electronically Signed   By: Danae Orleans M.D.   On: 03/26/2022 14:59   MR LIVER W WO CONTRAST  Result Date: 03/02/2022 CLINICAL DATA:  Abnormal labs.  Alpha fetoprotein.  Cirrhosis. EXAM: MRI ABDOMEN WITHOUT AND WITH CONTRAST TECHNIQUE: Multiplanar multisequence MR imaging of the abdomen was performed both before and after the administration of intravenous contrast. CONTRAST:  44mL GADAVIST GADOBUTROL 1 MMOL/ML IV SOLN COMPARISON:  CT scan 10/26/2021 FINDINGS: Lower  chest: Unremarkable. Hepatobiliary: Nodular liver contour with heterogeneous parenchyma suggests cirrhosis. Potential 16 mm focus of subcapsular hyperenhancement in the posterior right liver (30 second postcontrast image 27 of series 14). No definite restricted diffusion in this region. No differential washout persistent enhancing rim. Multiple gallstones measure on the order of 10 mm. No intrahepatic or extrahepatic biliary dilation. Pancreas: No focal mass lesion. No dilatation of the main duct. No intraparenchymal cyst. No peripancreatic edema. Spleen: Spleen measures 19.6 cm craniocaudal length, enlarged. No focal mass lesion. Adrenals/Urinary Tract: No adrenal nodule or mass. Kidneys unremarkable. Stomach/Bowel: Stomach is unremarkable. No gastric wall thickening. No evidence of outlet obstruction. Duodenum is normally positioned as is the ligament of Treitz. No small bowel or colonic dilatation within the visualized abdomen. Vascular/Lymphatic: No abdominal aortic aneurysm. Portal vein, superior mesenteric vein, and splenic vein are patent. Paraesophageal varices evident. There is no gastrohepatic or hepatoduodenal ligament lymphadenopathy. No retroperitoneal or mesenteric lymphadenopathy. Other:  No intraperitoneal free fluid. Musculoskeletal: No focal suspicious marrow enhancement within the visualized bony anatomy. IMPRESSION: 1. Potential 16 mm focus of subcapsular hyperenhancement in the posterior right liver. No definite restricted diffusion or differential washout. This may simply reflect a transient hepatic intensity difference, potentially related to underlying vascular malformation. Follow-up MRI in 3-6 months recommended to ensure stability. 2. Liver morphology compatible cirrhosis. 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 Electronically Signed   By: Kennith Center M.D.   On: 03/02/2022 17:23    Assessment and plan- Patient is a 56 y.o. female with  history of cirrhosis complicated by portal hypertension referred for liver lesion  Reviewed MRI images independently and discussed findings with the patient which shows a 71mm hypoenhancing lesion in the right lobe lobe of the liver which appears to be more of a intensity difference rather than a true mass.  I will discuss her case at tumor board next week.  Mildly elevated AFP can be seen in patients with cirrhosis and therefore does not necessarily indicate hepatocellular carcinoma.  I will call her after consensus of tumor board is back.  Margination is to do repeat MRI in 4 months time to  follow-up on these findings as well as repeat an AFP at that time.  Pancytopenia: Likely secondary to cirrhosis continue to monitor   Thank you for this kind referral and the opportunity to participate in the care of this patient   Visit Diagnosis 1. Liver lesion     Dr. Owens Shark, MD, MPH [facility] [phone] 03/30/2022

## 2022-04-05 ENCOUNTER — Telehealth: Payer: Self-pay | Admitting: *Deleted

## 2022-04-05 ENCOUNTER — Other Ambulatory Visit: Payer: Self-pay | Admitting: *Deleted

## 2022-04-05 ENCOUNTER — Other Ambulatory Visit: Payer: Self-pay

## 2022-04-05 DIAGNOSIS — K769 Liver disease, unspecified: Secondary | ICD-10-CM

## 2022-04-05 NOTE — Telephone Encounter (Signed)
Called the pt and got her voicemail and let pt know that dr. Janese Banks had sent the films to tumor conference. They all felt that it was not concerning for cancer and rec: 6 months MRI to be done. Also appt to see Dr. Janese Banks after the scan.  The appt has been made for 10/05/2022 at 11 am and the appt has been sent to you through mail or you can see it on my chart. The MD appt 4/8 11 am to get labs and right after to see Janese Banks. Asked her to call if she had any questions or issues with the appts time. Gave my direct number to call or send my chart

## 2022-04-05 NOTE — Progress Notes (Signed)
Tumor Board Documentation  Mary Anderson was presented by Dr Mary Anderson at our Tumor Board on 04/05/2022, which included representatives from medical oncology, radiology, pathology, pharmacy, surgical, pulmonology, radiation oncology, navigation, research, internal medicine, palliative care.  Mary Anderson currently presents as a new patient, for discussion with history of the following treatments: active survellience.  Additionally, we reviewed previous medical and familial history, history of present illness, and recent lab results along with all available histopathologic and imaging studies. The tumor board considered available treatment options and made the following recommendations: Active surveillance (Repeat MRI Liver 3 - 6 months)    The following procedures/referrals were also placed: No orders of the defined types were placed in this encounter.   Clinical Trial Status: not discussed   Staging used: Not Applicable  National site-specific guidelines   were discussed with respect to the case.  Tumor board is a meeting of clinicians from various specialty areas who evaluate and discuss patients for whom a multidisciplinary approach is being considered. Final determinations in the plan of care are those of the provider(s). The responsibility for follow up of recommendations given during tumor board is that of the provider.   Today's extended care, comprehensive team conference, Mary Anderson was not present for the discussion and was not examined.   Multidisciplinary Tumor Board is a multidisciplinary case peer review process.  Decisions discussed in the Multidisciplinary Tumor Board reflect the opinions of the specialists present at the conference without having examined the patient.  Ultimately, treatment and diagnostic decisions rest with the primary provider(s) and the patient.

## 2022-04-18 ENCOUNTER — Observation Stay (HOSPITAL_COMMUNITY)
Admission: EM | Admit: 2022-04-18 | Discharge: 2022-04-19 | Disposition: A | Discharge status: Home / Self Care | Payer: Self-pay | Attending: Internal Medicine | Admitting: Internal Medicine

## 2022-04-18 ENCOUNTER — Other Ambulatory Visit: Payer: Self-pay

## 2022-04-18 ENCOUNTER — Encounter (HOSPITAL_COMMUNITY): Payer: Self-pay

## 2022-04-18 ENCOUNTER — Emergency Department (HOSPITAL_COMMUNITY): Payer: Self-pay

## 2022-04-18 ENCOUNTER — Ambulatory Visit: Payer: Self-pay | Admitting: *Deleted

## 2022-04-18 ENCOUNTER — Ambulatory Visit (HOSPITAL_COMMUNITY)
Admission: EM | Admit: 2022-04-18 | Discharge: 2022-04-18 | Disposition: A | Discharge status: Home / Self Care | Payer: Self-pay | Attending: Internal Medicine | Admitting: Internal Medicine

## 2022-04-18 ENCOUNTER — Encounter (HOSPITAL_COMMUNITY): Payer: Self-pay | Admitting: Emergency Medicine

## 2022-04-18 DIAGNOSIS — I10 Essential (primary) hypertension: Secondary | ICD-10-CM | POA: Diagnosis present

## 2022-04-18 DIAGNOSIS — R7989 Other specified abnormal findings of blood chemistry: Secondary | ICD-10-CM | POA: Diagnosis present

## 2022-04-18 DIAGNOSIS — Z79899 Other long term (current) drug therapy: Secondary | ICD-10-CM | POA: Insufficient documentation

## 2022-04-18 DIAGNOSIS — R739 Hyperglycemia, unspecified: Secondary | ICD-10-CM

## 2022-04-18 DIAGNOSIS — K7460 Unspecified cirrhosis of liver: Secondary | ICD-10-CM | POA: Diagnosis present

## 2022-04-18 DIAGNOSIS — E1100 Type 2 diabetes mellitus with hyperosmolarity without nonketotic hyperglycemic-hyperosmolar coma (NKHHC): Secondary | ICD-10-CM | POA: Diagnosis present

## 2022-04-18 DIAGNOSIS — B182 Chronic viral hepatitis C: Secondary | ICD-10-CM | POA: Diagnosis present

## 2022-04-18 DIAGNOSIS — R7401 Elevation of levels of liver transaminase levels: Secondary | ICD-10-CM | POA: Insufficient documentation

## 2022-04-18 DIAGNOSIS — D696 Thrombocytopenia, unspecified: Secondary | ICD-10-CM | POA: Diagnosis present

## 2022-04-18 DIAGNOSIS — Z794 Long term (current) use of insulin: Secondary | ICD-10-CM

## 2022-04-18 DIAGNOSIS — N179 Acute kidney failure, unspecified: Secondary | ICD-10-CM | POA: Insufficient documentation

## 2022-04-18 DIAGNOSIS — R112 Nausea with vomiting, unspecified: Secondary | ICD-10-CM

## 2022-04-18 DIAGNOSIS — E1165 Type 2 diabetes mellitus with hyperglycemia: Principal | ICD-10-CM | POA: Insufficient documentation

## 2022-04-18 LAB — COMPREHENSIVE METABOLIC PANEL
ALT: 117 U/L — ABNORMAL HIGH (ref 0–44)
AST: 116 U/L — ABNORMAL HIGH (ref 15–41)
Albumin: 3 g/dL — ABNORMAL LOW (ref 3.5–5.0)
Alkaline Phosphatase: 98 U/L (ref 38–126)
Anion gap: 9 (ref 5–15)
BUN: 28 mg/dL — ABNORMAL HIGH (ref 6–20)
CO2: 26 mmol/L (ref 22–32)
Calcium: 8.7 mg/dL — ABNORMAL LOW (ref 8.9–10.3)
Chloride: 91 mmol/L — ABNORMAL LOW (ref 98–111)
Creatinine, Ser: 1.5 mg/dL — ABNORMAL HIGH (ref 0.44–1.00)
GFR, Estimated: 41 mL/min — ABNORMAL LOW (ref >60)
Glucose, Bld: 620 mg/dL (ref 70–99)
Potassium: 5 mmol/L (ref 3.5–5.1)
Sodium: 126 mmol/L — ABNORMAL LOW (ref 135–145)
Total Bilirubin: 5.7 mg/dL — ABNORMAL HIGH (ref 0.3–1.2)
Total Protein: 7 g/dL (ref 6.5–8.1)

## 2022-04-18 LAB — CBG MONITORING, ED
Glucose-Capillary: >600 mg/dL (ref 70–99)
Glucose-Capillary: >600 mg/dL (ref 70–99)
Glucose-Capillary: 467 mg/dL — ABNORMAL HIGH (ref 70–99)
Glucose-Capillary: 495 mg/dL — ABNORMAL HIGH (ref 70–99)
Glucose-Capillary: 448 mg/dL — ABNORMAL HIGH (ref 70–99)
Glucose-Capillary: 464 mg/dL — ABNORMAL HIGH (ref 70–99)
Glucose-Capillary: 414 mg/dL — ABNORMAL HIGH (ref 70–99)
Glucose-Capillary: 375 mg/dL — ABNORMAL HIGH (ref 70–99)
Glucose-Capillary: 293 mg/dL — ABNORMAL HIGH (ref 70–99)
Glucose-Capillary: 234 mg/dL — ABNORMAL HIGH (ref 70–99)
Glucose-Capillary: 214 mg/dL — ABNORMAL HIGH (ref 70–99)

## 2022-04-18 LAB — I-STAT BETA HCG BLOOD, ED (MC, WL, AP ONLY): I-stat hCG, quantitative: <5 m[IU]/mL (ref <5)

## 2022-04-18 LAB — I-STAT VENOUS BLOOD GAS, ED
Acid-Base Excess: 2 mmol/L (ref 0.0–2.0)
Bicarbonate: 26 mmol/L (ref 20.0–28.0)
Calcium, Ion: 1.09 mmol/L — ABNORMAL LOW (ref 1.15–1.40)
HCT: 44 % (ref 36.0–46.0)
Hemoglobin: 15 g/dL (ref 12.0–15.0)
O2 Saturation: 65 %
Potassium: 4.9 mmol/L (ref 3.5–5.1)
Sodium: 125 mmol/L — ABNORMAL LOW (ref 135–145)
TCO2: 27 mmol/L (ref 22–32)
pCO2, Ven: 39.3 mmHg — ABNORMAL LOW (ref 44–60)
pH, Ven: 7.429 (ref 7.25–7.43)
pO2, Ven: 33 mmHg (ref 32–45)

## 2022-04-18 LAB — CBC WITH DIFFERENTIAL/PLATELET
Abs Immature Granulocytes: 0.04 10*3/uL (ref 0.00–0.07)
Basophils Absolute: 0 10*3/uL (ref 0.0–0.1)
Basophils Relative: 0 %
Eosinophils Absolute: 0.1 10*3/uL (ref 0.0–0.5)
Eosinophils Relative: 2 %
HCT: 41.7 % (ref 36.0–46.0)
Hemoglobin: 14.1 g/dL (ref 12.0–15.0)
Immature Granulocytes: 1 %
Lymphocytes Relative: 12 %
Lymphs Abs: 0.8 10*3/uL (ref 0.7–4.0)
MCH: 35.3 pg — ABNORMAL HIGH (ref 26.0–34.0)
MCHC: 33.8 g/dL (ref 30.0–36.0)
MCV: 104.3 fL — ABNORMAL HIGH (ref 80.0–100.0)
Monocytes Absolute: 0.7 10*3/uL (ref 0.1–1.0)
Monocytes Relative: 12 %
Neutro Abs: 4.6 10*3/uL (ref 1.7–7.7)
Neutrophils Relative %: 73 %
Platelets: 64 10*3/uL — ABNORMAL LOW (ref 150–400)
RBC: 4 MIL/uL (ref 3.87–5.11)
RDW: 12.9 % (ref 11.5–15.5)
WBC: 6.2 10*3/uL (ref 4.0–10.5)
nRBC: 0 % (ref 0.0–0.2)

## 2022-04-18 LAB — OSMOLALITY: Osmolality: 304 mOsm/kg — ABNORMAL HIGH (ref 275–295)

## 2022-04-18 LAB — TYPE AND SCREEN
ABO/RH(D): O POS
Antibody Screen: NEGATIVE

## 2022-04-18 LAB — URINALYSIS, ROUTINE W REFLEX MICROSCOPIC
Bacteria, UA: NONE SEEN
Bilirubin Urine: NEGATIVE
Glucose, UA: >=500 mg/dL — AB
Ketones, ur: NEGATIVE mg/dL
Leukocytes,Ua: NEGATIVE
Nitrite: NEGATIVE
Protein, ur: NEGATIVE mg/dL
Specific Gravity, Urine: 1.017 (ref 1.005–1.030)
pH: 5 (ref 5.0–8.0)

## 2022-04-18 LAB — BETA-HYDROXYBUTYRIC ACID: Beta-Hydroxybutyric Acid: 0.52 mmol/L — ABNORMAL HIGH (ref 0.05–0.27)

## 2022-04-18 LAB — LIPASE, BLOOD: Lipase: 47 U/L (ref 11–51)

## 2022-04-18 LAB — MAGNESIUM: Magnesium: 1.5 mg/dL — ABNORMAL LOW (ref 1.7–2.4)

## 2022-04-18 MED ORDER — POTASSIUM CHLORIDE 10 MEQ/100ML IV SOLN
10.0000 meq | INTRAVENOUS | Status: AC
  Administered 2022-04-18 (×2): 10 meq via INTRAVENOUS
  Filled 2022-04-18 (×2): qty 100

## 2022-04-18 MED ORDER — MAGNESIUM SULFATE 2 GM/50ML IV SOLN
2.0000 g | Freq: Once | INTRAVENOUS | Status: AC
  Administered 2022-04-18: 2 g via INTRAVENOUS
  Filled 2022-04-18: qty 50

## 2022-04-18 MED ORDER — LACTATED RINGERS IV BOLUS
1000.0000 mL | Freq: Once | INTRAVENOUS | Status: AC
  Administered 2022-04-18: 1000 mL via INTRAVENOUS

## 2022-04-18 MED ORDER — ENOXAPARIN SODIUM 40 MG/0.4ML IJ SOSY
40.0000 mg | PREFILLED_SYRINGE | INTRAMUSCULAR | Status: DC
  Administered 2022-04-19: 40 mg via SUBCUTANEOUS

## 2022-04-18 MED ORDER — DEXTROSE 50 % IV SOLN: 0.0000 mL | INTRAVENOUS | Status: DC | PRN

## 2022-04-18 MED ORDER — LACTATED RINGERS IV SOLN: INTRAVENOUS | Status: DC

## 2022-04-18 MED ORDER — INSULIN REGULAR(HUMAN) IN NACL 100-0.9 UT/100ML-% IV SOLN
INTRAVENOUS | Status: DC
  Administered 2022-04-18: 5 [IU]/h via INTRAVENOUS
  Filled 2022-04-18: qty 100

## 2022-04-18 MED ORDER — LACTATED RINGERS IV BOLUS
500.0000 mL | Freq: Once | INTRAVENOUS | Status: AC
  Administered 2022-04-18: 500 mL via INTRAVENOUS

## 2022-04-18 MED ORDER — DEXTROSE IN LACTATED RINGERS 5 % IV SOLN: INTRAVENOUS | Status: DC

## 2022-04-18 NOTE — ED Provider Triage Note (Signed)
Emergency Medicine Provider Triage Evaluation Note  Mary Anderson , a 56 y.o. female  was evaluated in triage.  Pt complains of hyperglycemia and dizziness.  Has history of liver cirrhosis alcohol abuse hypertension and chronic hepatitis C.  Presented to the urgent care today for 3 days of dizziness.  Also states that she had associated visual disturbance.  And had some nausea and vomiting at home.  Upon evaluation in urgent care was found to have a blood sugar of 600.  Patient states that she has never been diagnosed with diabetes and does not take metformin and insulin at home.  Review of Systems  Positive: See above Negative: See above  Physical Exam  BP (!) 142/93   Pulse 72   Temp 98.4 F (36.9 C) (Oral)   Resp 20   Ht 5\' 4"  (1.626 m)   Wt 76.7 kg   LMP 04/22/2011   SpO2 97%   BMI 29.01 kg/m  Gen:   Awake, no distress   Resp:  Normal effort  MSK:   Moves extremities without difficulty  Other:    Medical Decision Making  Medically screening exam initiated at 11:52 AM.  Appropriate orders placed.  SAPPHIRE TYGART was informed that the remainder of the evaluation will be completed by another provider, this initial triage assessment does not replace that evaluation, and the importance of remaining in the ED until their evaluation is complete.  See above   Harriet Pho, PA-C 04/18/22 1154

## 2022-04-18 NOTE — ED Triage Notes (Signed)
Pt reports for 3 days having n/v, blurred vision. Taking her medications and still sick. Trying to take bites of crackers and soup. Trying to drink water and gatorade.  Adds that stays thirsty and feels like can't get enough water.

## 2022-04-18 NOTE — Telephone Encounter (Signed)
  Chief Complaint: dizzy, vomiting Symptoms: diarrhea, blurred vision both eyes Frequency: 2 days Pertinent Negatives: Patient denies fever Disposition: [x] ED /[x] Urgent Care (no appt availability in office) / [] Appointment(In office/virtual)/ []  [facility]/ [] Home Care/ [] Refused Recommended Disposition /[] [facility]/ []  Follow-up with PCP Additional Notes: Pt took sitting and standing BP while on phone and was not orthostatic, unsure if arm cuff or wrist. Pt husband going to drive her to ED or UC.   Reason for Disposition  [1] Drinking very little AND [2] dehydration suspected (e.g., no urine > 12 hours, very dry mouth, very lightheaded)  Answer Assessment - Initial Assessment Questions 1. DESCRIPTION: "Describe your dizziness."     Not spinning 2. LIGHTHEADED: "Do you feel lightheaded?" (e.g., somewhat faint, woozy, weak upon standing)     Yes, especially when get up 3. VERTIGO: "Do you feel like either you or the room is spinning or tilting?" (i.e. vertigo)     no 4. SEVERITY: "How bad is it?"  "Do you feel like you are going to faint?" "Can you stand and walk?"   - MILD: Feels slightly dizzy, but walking normally.   - MODERATE: Feels unsteady when walking, but not falling; interferes with normal activities (e.g., school, work).   - SEVERE: Unable to walk without falling, or requires assistance to walk without falling; feels like passing out now.      Can walk but gets very nauseated when stands up 5. ONSET:  "When did the dizziness begin?"     Two days 6. AGGRAVATING FACTORS: "Does anything make it worse?" (e.g., standing, change in head position)     Vomiting making worse 7. HEART RATE: "Can you tell me your heart rate?" "How many beats in 15 seconds?"  (Note: not all patients can do this)       no 8. CAUSE: "What do you think is causing the dizziness?"     dehydration 9. RECURRENT SYMPTOM: "Have you had dizziness before?" If Yes, ask: "When was the last  time?" "What happened that time?"     no 10. OTHER SYMPTOMS: "Do you have any other symptoms?" (e.g., fever, chest pain, vomiting, diarrhea, bleeding)       Vomiting, blurred vision, a little diarrhea 11. PREGNANCY: "Is there any chance you are pregnant?" "When was your last menstrual period?"       no  Protocols used: Dizziness - Lightheadedness-A-AH

## 2022-04-18 NOTE — H&P (Cosign Needed Addendum)
Date: 04/18/2022               Patient Name:  Mary Anderson MRN: [id number]  DOB: October 29, 1965 Age / Sex: 56 y.o., female   PCP: [name], NP              Medical Service: Internal Medicine Teaching Service              Attending Physician: Dr. Earl Lagos, MD    First Contact: Mary Anderson, MS3 Pager: [phone]  Second Contact: Dr. Lajuana Ripple, MD Pager: [phone]  Third Contact Dr. Doran Stabler, DO Pager: [phone]       After Hours (After 5p/  First Contact Pager: [phone]  weekends / holidays): Second Contact Pager: [phone]   Chief Complaint: Blurry vision, emesis  History of Present Illness:   Symptoms began two days ago with nausea, vomiting, and blurry vision that all continued to worsen. She also reports polyuria more than her baseline while on lasix, and reports polydipsia that started last night. She denies any symptoms prior to 2 days ago and was in her usual state of health before this. She has not taken or started any new medications. She does endorse a worsening cough with white sputum that started several weeks ago, but denies fever, headache, chills, nasal congestion and other recent infection. She denies abdominal pain, headache, dysuria. She does endorse mild vaginal itching, but denies discharge. She states that her lower extremity edema is stable.  She states she has never been diagnosed with diabetes, and does not know if her blood sugar has been elevated in the past. She does not believe she has ever had an A1c. She endorses multiple barriers to accessing medical care including being uninsured, struggling with high medical bills, and not being established with a primary care provider. She does have a family history of diabetes in her mom.   She has made significant lifestyle changes since her prior hospitalization (10/26/21-10/30/21) for hypertensive urgency with liver cirrhosis and ascites, stating that she has not used alcohol in 6 months, has  adapted her diet to more heart healthy options, has been wearing compression stockings and adherent to her medications. She denies any barriers to obtaining or consistently taking her medications.   ED Course:  On arrival patient was hypertensive to 148/80, afebrile, pulse 71, with respiratory rate of 19 and SpO2 of 100% on room air. Glucose was >600. Endotool protocol for HHS initiated given pH 7.429, anion gap of 9, bicarb 26, and no ketones in UA. Given LR, 2g 71mL of magnesium sulfate x1, Kcl x2, and  started on insulin drip (100 units/124mL) Admitted to teaching service.  Meds:  Current Meds  Medication Sig   acetaminophen (TYLENOL) 500 MG tablet Take 500-1,000 mg by mouth every 8 (eight) hours as needed for mild pain.   carvedilol (COREG) 6.25 MG tablet Take 1 tablet (6.25 mg total) by mouth 2 (two) times daily with a meal.   folic acid (FOLVITE) 1 MG tablet TAKE 1 TABLET BY MOUTH EVERY DAY   furosemide (LASIX) 40 MG tablet Take 1 tablet (40 mg total) by mouth daily.   hydrALAZINE (APRESOLINE) 25 MG tablet Take 1 tablet (25 mg total) by mouth 2 (two) times daily.   ibuprofen (ADVIL,MOTRIN) 200 MG tablet Take 200-400 mg by mouth daily as needed (pain).   promethazine-dextromethorphan (PROMETHAZINE-DM) 6.25-15 MG/5ML syrup Take 5 mLs by mouth 3 (three) times daily as needed for cough.   spironolactone (  ALDACTONE) 100 MG tablet Take 1 tablet (100 mg total) by mouth daily.     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   Physical Exam: Blood pressure 119/74, pulse 64, temperature 98 F (36.7 C), temperature source Oral, resp. rate 15, height 5\' 4"  (1.626 m), weight 76.7 kg, last menstrual period 04/22/2011, SpO2 96 %. Physical Exam Constitutional:  Lying in bed, non-toxic appearing HENT:     Head: Normocephalic and atraumatic.  Cardiovascular:     Rate and Rhythm: Normal rate. Rhythm irregular with bigeminy noted on monitor Pulmonary:     Effort: Pulmonary effort is normal.     Breath sounds: Rhonchi present bilaterally Abdominal:     General: There is no distension.     Palpations: Abdomen is soft.     Tenderness: There is no abdominal tenderness. There is no guarding.  Musculoskeletal:     Cervical back: Normal range of motion.     Right lower leg: Trace edema present.     Left lower leg:  Trace edema present.  Skin:    General: Skin is warm and dry.     Coloration: Skin is jaundiced, scleral icterus Neurological:     Mental Status: She is alert.   CMP   Latest Reference Range & Units Most Recent  Sodium 135 - 145 mmol/L 125 (L) 04/18/22 12:00  Potassium 3.5 - 5.1 mmol/L 4.9 04/18/22 12:00  Chloride 98 - 111 mmol/L 91 (L) 04/18/22 11:50  CO2 22 - 32 mmol/L 26 04/18/22 11:50  Glucose 70 - 99 mg/dL 620 (HH) 04/18/22 11:50  BUN 6 - 20 mg/dL 28 (H) 04/18/22 11:50  Creatinine 0.44 - 1.00 mg/dL 1.50 (H) 04/18/22 11:50  Calcium 8.9 - 10.3 mg/dL 8.7 (L) 04/18/22 11:50  Anion gap 5 - 15  9 04/18/22 11:50  Calcium Ionized 1.15 - 1.40 mmol/L 1.09 (L) 04/18/22 12:00  Magnesium 1.7 - 2.4 mg/dL 1.5 (L) 04/18/22 11:50   Alkaline Phosphatase 38 - 126 U/L 98 04/18/22 11:50  Albumin 3.5 - 5.0 g/dL 3.0 (L) 04/18/22 11:50  Lipase 11 - 51 U/L 47 04/18/22 11:50  AST 15 - 41 U/L 116 (H) 04/18/22 11:50  ALT 0 - 44 U/L 117 (H) 04/18/22 11:50  Total Protein 6.5 - 8.1 g/dL 7.0 04/18/22 11:50  Total Bilirubin 0.3 - 1.2 mg/dL 5.7 (H) 04/18/22 11:50  GFR, Estimated >60 mL/min 41 (L) 04/18/22 11:50    Urinalysis  04/18/22 11:40  Appearance CLEAR  Bilirubin Urine NEGATIVE  Color, Urine YELLOW  Glucose, UA >=500 !  Hgb urine dipstick MODERATE !  Ketones, ur NEGATIVE  Leukocytes,Ua NEGATIVE  Nitrite  NEGATIVE  pH 5.0  Protein NEGATIVE  Specific Gravity, Urine 1.017    Blood Gas  Latest Reference Range & Units Most Recent  Sample type  VENOUS 04/18/22 12:00  pH, Ven 7.25 - 7.43  7.429 04/18/22 12:00  pCO2, Ven 44 - 60 mmHg 39.3 (L) 04/18/22 12:00  pO2, Ven 32 - 45 mmHg 33 04/18/22 12:00  TCO2 22 - 32 mmol/L 27 04/18/22 12:00  Acid-Base Excess 0.0 - 2.0 mmol/L 2.0 04/18/22 12:00  Bicarbonate 20.0 - 28.0 mmol/L 26.0 04/18/22 12:00  O2 Saturation % 65 04/18/22 12:00    CBG Trend  Latest Reference Range & Units 04/18/22 11:57 04/18/22 14:37 04/18/22 15:06 04/18/22 15:38  Glucose-Capillary 70 - 99 mg/dL >063 (HH) 016 (H) 010 (H) 464 (H)   CBC   Most Recent  WBC 6.2 04/18/22 11:50  RBC 4.00 04/18/22 11:50  Hemoglobin 15.0 04/18/22 12:00  HCT 44.0 04/18/22 12:00  MCV 104.3 (H) 04/18/22 11:50  MCH 35.3 (H) 04/18/22 11:50  MCHC 33.8 04/18/22 11:50  RDW 12.9 04/18/22 11:50  Platelets 64 (L) 04/18/22 11:50  nRBC 0.0 04/18/22 11:50   EKG: normal sinus rhythm with abnormalities indicating prior anterior or septal infarct of unknown age consistent with prior EKGs   CXR: No acute cardiopulmonary disease. Mild right hemidiaphragm elevation.   Problem List:  Principal Problem:   Hyperosmolar hyperglycemic state (HHS) (HCC) Active Problems:   Hypertension    AKI   Chronic  hepatitis C (HCC)   Cirrhosis (HCC)   Thrombocytopenia (HCC)   Elevated LFTs  Assessment & Plan by Problem: Mary Anderson is a 56 year old female with a past medical history of hypertension and cirrhosis associated with former alcohol use disorder and hepatitis C presenting to the emergency department with nausea, vomiting, and blurry vision, admitted for treatment of HHS.   #HHS:  Clinical picture of nausea, emesis, blurred vision, polyuria, polydipsia, and dizziness, along with serum glucose >600 on admission and glucosuria >500, this is most likely a presentation of HHS in a patient with previously undiagnosed diabetes mellitus. The presentation is inconsistent with a diagnosis of DKA given absence of acidosis, bicarb of 26, and a normal anion gap. Given past medical history of cirrhosis, could consider more rare causes such as hemachromatosis precipitated diabetes, however, given limited access to care and several elevated blood sugars (>200) during prior hospitalization in May, believe more likely a chronic development of disease. Unlikely that this is sepsis given no fever, normal heart rate, normotensive, and no leukocytosis. - Continue LR at 147ml/hr, follow EndoTool protocol - Continue insulin 100 units / mL, advance per EndoTool protocol - KCl 37mEq/hour, transition off per EndoTool protocol - NPO, advance diet per EndoTool protocol - Refer to [facility] and diabetes educator to plan for outpatient follow-up on discharge - CBG check per EndoTool protocol - Repeat CMP tomorrow - Repeat magnesium level tomorrow  #AKI  Creatinine of 1.5, baseline is <1. Likely prerenal in the setting of hypovolemia.  - hold antihypertensive and diuretics - Repeat CMP tomorrow  #Alcoholic cirrhosis  Patient has morphology of cirrhosis and evidence of portal hypertension on liver MRI. No varices found on EGD 01/2022. Does not appear grossly fluid overloaded on exam with mild bilateral lower extremity edema stable  from prior state of health. - Holding lasix and spironolactone due to AKI   #Liver lesion Patient received MRI on 03/02/22 due to cirrhosis and elevated alpha fetoprotein, noted potential 16 mm focus of subcapsular hyperenhancement in the posterior right  liver and 3-6 mo f/u MRI was recommended. Do not anticipate any intervention needed during this admission.   #Chronic hep C Viral load 1.8 million in June. She is following GI with Dr. Servando Snare and has received Hep A/B vaccine. Do not see follow up treatment for her Hep C. Will need to clarify with patient tomorrow.  #Elevated LFTs:  Elevated liver enzymes may be elevated in the setting of hypoperfusion. Possible concern for biliary obstruction, but reassuring that patient denies right upper quadrant pain and has no tenderness to palpation on abdominal exam. However, would have a low threshold to obtain RUQ ultrasound if endorses any symptoms. - Fractionated bilirubin - Repeat CMP tomorrow  #Thrombocytopenia Likely due to cirrhosis - continue to monitor for any signs of bleeding - PT-INR tomorrow - Repeat CBC tomorrow  #HTN: Patient has predominantly been normotensive  - hold furosemide - hold spironolactone - hold hydralazine - hold carvedilol  #Vaginal irritation Given glucosuria >500 and mild vaginal itching, continue to monitor for signs of vaginal yeast infection.   Dispo: Admit patient to Observation with expected length of stay less than 2 midnights.  Signed: Mariah Anderson, Medical Student 04/18/2022, 5:15 PM  Pager: [phone]  Attestation for Student Documentation:  I personally was present and performed or re-performed the history, physical exam and medical decision-making activities of this service and have verified that the service and findings are accurately documented in the student's note.  Doran Stabler, DO 04/18/2022, 6:27 PM

## 2022-04-18 NOTE — ED Notes (Signed)
Patient is being discharged from the Urgent Care and sent to the Emergency Department via POV with spouse. Per Mare Ferrari, NP, patient is in need of higher level of care due to blood glucose over 600. Patient is aware and verbalizes understanding of plan of care.  Vitals:   04/18/22 1032  BP: (!) 148/80  Pulse: 71  Resp: 19  Temp: 98 F (36.7 C)  SpO2: 100%

## 2022-04-18 NOTE — ED Notes (Signed)
Patient's CBG 293, per endo tool no change to insulin drip rate at this time. Insulin drip remains at 6 units/hr.

## 2022-04-18 NOTE — ED Notes (Signed)
Endo Tool completed, insulin gtt rate to remain at 6 units/hr at this time.

## 2022-04-18 NOTE — ED Provider Notes (Addendum)
[facility] EMERGENCY DEPARTMENT Provider Note   CSN: [id number] Arrival date & time: 04/18/22  1119     History  Chief Complaint  Patient presents with   Hyperglycemia   HPI Mary Anderson is a 56 y.o. female with history of liver cirrhosis, alcohol abuse, hypertension, and chronic hepatitis C presenting for hyperglycemia and dizziness. Presented to the urgent care today for 3 days of dizziness.  Also states that she had associated visual disturbance.  And had some nausea and vomiting at home.  Upon evaluation in urgent care was found to have a blood sugar of 600.  Patient states that she has never been diagnosed with diabetes and does not take metformin and insulin at home.  Per chart review, patient takes Lasix and spironolactone.   Hyperglycemia Associated symptoms: dizziness, nausea and vomiting        Home Medications Prior to Admission medications   Medication Sig Start Date End Date Taking? Authorizing Provider  acetaminophen (TYLENOL) 500 MG tablet Take 500-1,000 mg by mouth every 8 (eight) hours as needed for mild pain.   Yes [provider]  carvedilol (COREG) 6.25 MG tablet Take 1 tablet (6.25 mg total) by mouth 2 (two) times daily with a meal. 03/26/22 06/24/22 Yes Minette Brine, Amy J, NP  folic acid (FOLVITE) 1 MG tablet TAKE 1 TABLET BY MOUTH EVERY DAY 02/20/22  Yes Minette Brine, Amy J, NP  furosemide (LASIX) 40 MG tablet Take 1 tablet (40 mg total) by mouth daily. 03/26/22 06/24/22 Yes Minette Brine, Amy J, NP  hydrALAZINE (APRESOLINE) 25 MG tablet Take 1 tablet (25 mg total) by mouth 2 (two) times daily. 03/26/22 06/24/22 Yes Minette Brine, Amy J, NP  ibuprofen (ADVIL,MOTRIN) 200 MG tablet Take 200-400 mg by mouth daily as needed (pain).   Yes [provider]  promethazine-dextromethorphan (PROMETHAZINE-DM) 6.25-15 MG/5ML syrup Take 5 mLs by mouth 3 (three) times daily as needed for cough. 03/26/22  Yes Minette Brine, Amy J, NP  spironolactone (ALDACTONE)  100 MG tablet Take 1 tablet (100 mg total) by mouth daily. 03/26/22 06/24/22 Yes Camillia Herter, NP      Allergies    Patient has no known allergies.    Review of Systems   Review of Systems  Eyes:  Positive for visual disturbance.  Gastrointestinal:  Positive for nausea and vomiting.  Neurological:  Positive for dizziness and light-headedness.    Physical Exam Updated Vital Signs BP 129/77   Pulse 65   Temp 98.4 F (36.9 C) (Oral)   Resp 13   Ht 5\' 4"  (1.626 m)   Wt 76.7 kg   LMP 04/22/2011   SpO2 98%   BMI 29.01 kg/m  Physical Exam Musculoskeletal:     Comments: 1+ edema around both ankles.     ED Results / Procedures / Treatments   Labs (all labs ordered are listed, but only abnormal results are displayed) Labs Reviewed  URINALYSIS, ROUTINE W REFLEX MICROSCOPIC - Abnormal; Notable for the following components:      Result Value   Glucose, UA >=500 (*)    Hgb urine dipstick MODERATE (*)    All other components within normal limits  CBC WITH DIFFERENTIAL/PLATELET - Abnormal; Notable for the following components:   MCV 104.3 (*)    MCH 35.3 (*)    Platelets 64 (*)    All other components within normal limits  COMPREHENSIVE METABOLIC PANEL - Abnormal; Notable for the following components:   Sodium 126 (*)    Chloride 91 (*)  Glucose, Bld 620 (*)    BUN 28 (*)    Creatinine, Ser 1.50 (*)    Calcium 8.7 (*)    Albumin 3.0 (*)    AST 116 (*)    ALT 117 (*)    Total Bilirubin 5.7 (*)    GFR, Estimated 41 (*)    All other components within normal limits  MAGNESIUM - Abnormal; Notable for the following components:   Magnesium 1.5 (*)    All other components within normal limits  CBG MONITORING, ED - Abnormal; Notable for the following components:   Glucose-Capillary >600 (*)    All other components within normal limits  I-STAT VENOUS BLOOD GAS, ED - Abnormal; Notable for the following components:   pCO2, Ven 39.3 (*)    Sodium 125 (*)    Calcium, Ion  1.09 (*)    All other components within normal limits  LIPASE, BLOOD  BETA-HYDROXYBUTYRIC ACID  OSMOLALITY  I-STAT BETA HCG BLOOD, ED ([facility], [facility], [facility] ONLY)  TYPE AND SCREEN    EKG EKG Interpretation  Date/Time:  Wednesday April 18 2022 11:26:14 EDT Ventricular Rate:  69 PR Interval:  150 QRS Duration: 88 QT Interval:  394 QTC Calculation: 422 R Axis:   22 Text Interpretation: Normal sinus rhythm Septal infarct , age undetermined Abnormal ECG When compared with ECG of 26-Oct-2021 11:39, PREVIOUS ECG IS PRESENT Confirmed by Dene Gentry [phone]) on 04/18/2022 12:50:34 PM  Radiology DG Chest 2 View  Result Date: 04/18/2022 CLINICAL DATA:  Cirrhosis. Shortness of breath. Concern for fluid overload. EXAM: CHEST - 2 VIEW COMPARISON:  03/26/2022 FINDINGS: Multiple wires and leads project over the chest on the frontal view. Mild right hemidiaphragm elevation. Midline trachea. Normal heart size and mediastinal contours. No pleural effusion or pneumothorax. Clear lungs. No congestive failure. IMPRESSION: No acute cardiopulmonary disease. Electronically Signed   By: Abigail Miyamoto M.D.   On: 04/18/2022 13:15    Procedures .Critical Care  Performed by: Harriet Pho, PA-C Authorized by: Harriet Pho, PA-C   Critical care provider statement:    Critical care time (minutes):  30   Critical care was necessary to treat or prevent imminent or life-threatening deterioration of the following conditions:  Endocrine crisis and metabolic crisis   Critical care was time spent personally by me on the following activities:  Blood draw for specimens, development of treatment plan with patient or surrogate, discussions with consultants, evaluation of patient's response to treatment, examination of patient, ordering and performing treatments and interventions, ordering and review of laboratory studies, ordering and review of radiographic studies, pulse oximetry, re-evaluation of patient's condition and  review of old charts     Medications Ordered in ED Medications  insulin regular, human (MYXREDLIN) 100 units/ 100 mL infusion (has no administration in time range)  dextrose 5 % in lactated ringers infusion (has no administration in time range)  dextrose 50 % solution 0-50 mL (has no administration in time range)  potassium chloride 10 mEq in 100 mL IVPB (has no administration in time range)  magnesium sulfate IVPB 2 g 50 mL (has no administration in time range)  lactated ringers bolus 1,000 mL (has no administration in time range)  lactated ringers bolus 500 mL (0 mLs Intravenous Stopped 04/18/22 1356)    ED Course/ Medical Decision Making/ A&P                           Medical Decision Making Amount  and/or Complexity of Data Reviewed Labs: ordered. Radiology: ordered.  Risk Prescription drug management. Decision regarding hospitalization.   This patient presents to the ED for concern of dizziness, nausea vomiting and hyperglycemia, this involves a number of treatment options, and is a complaint that carries with it a high risk of complications and morbidity.  The differential diagnosis includes DKA, HHS, hyperglycemia, and sepsis   Co morbidities: Discussed in HPI     EMR reviewed including pt PMHx, past surgical history and past visits to ER.   See HPI for more details   Lab Tests:   I ordered and independently interpreted labs. Labs notable for hyperglycemia, hypomagnesemia, elevated BUN/creatinine, elevated AST and ALT, hyperbilirubinemia   Imaging Studies:  NAD. I personally reviewed all imaging studies and no acute abnormality found. I agree with radiology interpretation.    Cardiac Monitoring:  The patient was maintained on a cardiac monitor.  I personally viewed and interpreted the cardiac monitored which showed an underlying rhythm of: NSR EKG non-ischemic   Medicines ordered:  I ordered medication including lactated Ringer bolus for volume  resuscitation.  IV magnesium for hypomagnesemia.  Potassium chloride for repletion in setting of insulin infusion and volume resuscitation.  Reevaluation of the patient after these medicines showed that the patient stayed the same I have reviewed the patients home medicines and have made adjustments as needed   Critical Interventions:  Management of severe hyperglycemia   Consults/Attending Physician   I requested consultation with hospital team,  and discussed lab and imaging findings as well as pertinent plan - they recommend: Admission to the hospital for management of severe hyperglycemia   Reevaluation:  After the interventions noted above I re-evaluated patient and found that they have :stayed the same     Problem List / ED Course:  Patient presented for dizziness, nausea, vomiting and concern for hyperglycemia.  BG was greater than 600 on 2 checks.  Labs revealed she did not have an anion gap acidosis which points away from DKA.  Symptoms are likely related to HHS.  Did consider her liver disease in setting of need for volume resuscitation.  Chest x-ray was clear, only mild edema noted around her ankle ankles.  Clinically appeared euvolemic. volume resuscitated and started insulin.  Labs also revealed low magnesium.  Repleted with IV mag.  Admitted to hospital service for severe hyperglycemia.  Doubt sepsis, given no fever, normal heart rate and blood pressure and no white blood cell count.   Dispostion:  After consideration of the diagnostic results and the patients response to treatment, I feel that the patient would benefit from admission to the hospital for management of severe hyperglycemia.         Final Clinical Impression(s) / ED Diagnoses Final diagnoses:  Hyperglycemia    Rx / DC Orders ED Discharge Orders     None         Harriet Pho, PA-C 04/18/22 1411    Harriet Pho, PA-C 04/18/22 1412    Harriet Pho, PA-C 04/18/22  1413    Valarie Merino, MD 04/18/22 [phone]

## 2022-04-18 NOTE — Telephone Encounter (Signed)
PCP aware

## 2022-04-18 NOTE — ED Triage Notes (Signed)
Patient reports no hx of DM but for past 3 days has been having n/v blurred vision and dizziness. Patient went to UC and glucose >600.  Kussumal respirations noted.

## 2022-04-18 NOTE — Discharge Instructions (Addendum)
Please go to the nearest emergency department for further evaluation and management of your extremely high blood sugar as this is very concerning for possible diabetic ketoacidosis.

## 2022-04-18 NOTE — ED Provider Notes (Signed)
[facility]    CSN: [id number] Arrival date & time: 04/18/22  1002      History   Chief Complaint Chief Complaint  Patient presents with  . Emesis  . Blurred Vision    HPI Cecil Bixby Mattox is a 56 y.o. female.   Patient presents urgent care for evaluation of blurry vision, nausea, vomiting, and generalized weakness for the last 3 days.  She has a significant medical history of liver cirrhosis, alcohol abuse, hypertension, and chronic hepatitis C.  She denies past medical history of diabetes.  Also reports polydipsia, urinary frequency, and increased fatigue over the last 3 days.  Denies abdominal pain, diarrhea, constipation, back pain, fever/chills, neck pain, shortness of breath, chest pain, leg swelling, and headache.  She does not wear glasses for vision correction at baseline and states that her vision is very blurry and she feels dizzy.  Dizziness is worse upon standing and with position changes.  Emesis is nonbloody/bilious.  She states that she ate 3 times this morning and attempt to be able to keep food down but was unsuccessful.  She does not check her blood sugars at home and has been eating a normal diet lately.  Denies known sick contacts.  Husband drove her to the clinic today due to dizziness and blurry vision.  She has not attempted use of any over-the-counter medications prior to arrival urgent care for symptoms.  To the nearest emergency department via personal vehicle at this time.  [facility] ER nurse made aware of patient's impending arrival.   Emesis   Past Medical History:  Diagnosis Date  . Hypertension     Patient Active Problem List   Diagnosis Date Noted  . Hypokalemia 10/29/2021  . Macrocytic anemia 10/29/2021  . Anasarca 10/27/2021  . Pleural effusion on right 10/27/2021  . Elevated LFTs 10/27/2021  . Hypoalbuminemia 10/27/2021  . EtOH dependence (HCC) 10/27/2021  . 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 Cirrhosis (HCC) 09/11/2017  . Splenomegaly 09/11/2017  . Thrombocytopenia (HCC) 09/11/2017  . Calculus of gallbladder without cholecystitis without obstruction 09/10/2017  . Chronic hepatitis C (HCC) 08/30/2017  . Hypertension 07/31/2017    Past Surgical History:  Procedure Laterality Date  . ESOPHAGOGASTRODUODENOSCOPY (EGD) WITH PROPOFOL N/A 01/30/2022   Procedure: ESOPHAGOGASTRODUODENOSCOPY (EGD) WITH PROPOFOL;  Surgeon: Midge Minium, MD;  Location: [facility] ENDOSCOPY;  Service: Endoscopy;  Laterality: N/A;  . IR THORACENTESIS ASP PLEURAL SPACE W/IMG GUIDE  10/27/2021    OB History   No obstetric history on file.      Home Medications    Prior to Admission medications   Medication Sig Start Date End Date Taking? Authorizing Provider  acetaminophen (TYLENOL) 500 MG tablet Take 500 mg by mouth daily.    [provider]  carvedilol (COREG) 6.25 MG tablet Take 1 tablet (6.25 mg total) by mouth 2 (two) times daily with a meal. 03/26/22 06/24/22  [name], NP  folic acid (FOLVITE) 1 MG tablet TAKE 1 TABLET BY MOUTH EVERY DAY 02/20/22   [name], NP  furosemide (LASIX) 40 MG tablet Take 1 tablet (40 mg total) by mouth daily. 03/26/22 06/24/22  [name], NP  hydrALAZINE (APRESOLINE) 25 MG tablet Take 1 tablet (25 mg total) by mouth 2 (two) times daily. 03/26/22 06/24/22  [name], NP  ibuprofen (ADVIL,MOTRIN) 200 MG tablet Take 200-400 mg by mouth daily as needed (pain). Patient not taking: Reported on 01/30/2022  [provider]  promethazine-dextromethorphan (PROMETHAZINE-DM) 6.25-15 MG/5ML syrup Take 5 mLs by mouth 3 (three) times daily as needed for cough. 03/26/22   [name], NP  spironolactone (ALDACTONE) 100 MG tablet Take 1 tablet (100 mg total) by mouth daily. 03/26/22 06/24/22  [name], NP    Family History Family History  Problem Relation Age of Onset  . Hypertension Mother   . Colon cancer Mother   .  Hypertension Brother   . Cancer Maternal Uncle   . Cancer Maternal Uncle   . Clotting disorder Neg Hx   . Diabetes Neg Hx     Social History Social History   Tobacco Use  . Smoking status: Never    Passive exposure: Never  . Smokeless tobacco: Never  Vaping Use  . Vaping Use: Never used  Substance Use Topics  . Alcohol use: Not Currently    Comment: no alcohol the past 6 months.  . Drug use: No     Allergies   Patient has no known allergies.   Review of Systems Review of Systems  Gastrointestinal:  Positive for vomiting.  Per HPI   Physical Exam Triage Vital Signs ED Triage Vitals  Enc Vitals Group     BP 04/18/22 1032 (!) 148/80     Pulse Rate 04/18/22 1032 71     Resp 04/18/22 1032 19     Temp 04/18/22 1032 98 F (36.7 C)     Temp src --      SpO2 04/18/22 1032 100 %     Weight --      Height --      Head Circumference --      Peak Flow --      Pain Score 04/18/22 1029 0     Pain Loc --      Pain Edu? --      Excl. in GC? --    No data found.  Updated Vital Signs BP (!) 148/80 (BP Location: Right Arm)   Pulse 71   Temp 98 F (36.7 C)   Resp 19   LMP 04/22/2011   SpO2 100%   Visual Acuity Right Eye Distance:   Left Eye Distance:   Bilateral Distance:    Right Eye Near:   Left Eye Near:    Bilateral Near:     Physical Exam Vitals and nursing note reviewed.  Constitutional:      Appearance: She is ill-appearing. She is not toxic-appearing.  HENT:     Head: Normocephalic and atraumatic.     Right Ear: Hearing and external ear normal.     Left Ear: Hearing and external ear normal.     Nose: Nose normal.     Mouth/Throat:     Lips: Pink.     Mouth: Mucous membranes are dry.     Pharynx: No posterior oropharyngeal erythema.  Eyes:     General: Lids are normal. Vision grossly intact. Gaze aligned appropriately. No visual field deficit.    Extraocular Movements: Extraocular movements intact.     Conjunctiva/sclera: Conjunctivae normal.      Right eye: Right conjunctiva is not injected.     Left eye: Left conjunctiva is not injected.     Pupils: Pupils are equal, round, and reactive to light.  Cardiovascular:     Rate and Rhythm: Normal rate and regular rhythm.     Heart sounds: Normal heart sounds, S1 normal and S2 normal.  Pulmonary:     Effort: Pulmonary  effort is normal. No respiratory distress.     Breath sounds: Normal breath sounds and air entry.  Abdominal:     General: Abdomen is flat. Bowel sounds are normal.     Palpations: Abdomen is soft.  Musculoskeletal:     Cervical back: Full passive range of motion without pain and neck supple.     Right lower leg: No edema.     Left lower leg: No edema.  Skin:    General: Skin is warm and dry.     Capillary Refill: Capillary refill takes less than 2 seconds.     Findings: No rash.  Neurological:     General: No focal deficit present.     Mental Status: She is alert and oriented to person, place, and time. Mental status is at baseline.     Cranial Nerves: No dysarthria or facial asymmetry.     Motor: Weakness present.  Psychiatric:        Mood and Affect: Mood normal.        Speech: Speech normal.        Behavior: Behavior normal.        Thought Content: Thought content normal.        Judgment: Judgment normal.     UC Treatments / Results  Labs (all labs ordered are listed, but only abnormal results are displayed) Labs Reviewed  CBG MONITORING, ED - Abnormal; Notable for the following components:      Result Value   Glucose-Capillary >600 (*)    All other components within normal limits    EKG   Radiology No results found.  Procedures Procedures (including critical care time)  Medications Ordered in UC Medications - No data to display  Initial Impression / Assessment and Plan / UC Course  I have reviewed the triage vital signs and the nursing notes.  Pertinent labs & imaging results that were available during my care of the patient were  reviewed by me and considered in my medical decision making (see chart for details).   1.  Hyperglycemia CBG shows blood sugar of over 600.  This is very concerning for possible diabetic ketoacidosis with associated blurry vision, weakness, and emesis over the last 3 days.  Recommend patient go to the nearest emergency department for further evaluation and work-up. Vital signs are hemodynamically stable and neurologic exam is stable. Encouraged [facility] transport, but patient declines and would rather go to the emergency department with her husband. Patient is stable to go to the nearest emergency department at this time via personal vehicle.  Agricultural consultant at [facility] emergency department made aware of patient's impending arrival.  Discussed precautions related to deferring up emergency department visit at this time to which patient and husband express understanding and agreement.   Final Clinical Impressions(s) / UC Diagnoses   Final diagnoses:  Hyperglycemia  Nausea and vomiting, unspecified vomiting type     Discharge Instructions      Please go to the nearest emergency department for further evaluation and management of your extremely high blood sugar as this is very concerning for possible diabetic ketoacidosis.     ED Prescriptions   None    PDMP not reviewed this encounter.   Talbot Grumbling, [place] 04/18/22 1132

## 2022-04-18 NOTE — Hospital Course (Signed)
-  Monday: nausea, vomit, blurry vision. -Tues: nauseous and 2 x emesis  -Today: worse blurry vision. -Non bloody emesis. -Reports polyuria, more than usual. Report polydipsia, started last night. -past week, usual state of health.  -No new meds. No change in diet. Eating healthy food. -Wear compression stocking. Takes diuretic consistently  -6 mo ago had bad pneumonia  -3-4 weeks, worsening cough with sputum.   -No prior A1C.  No RUQ pain with eating, fever, chill -Still take meds while being sick -No worsening LE edema  Fam -Mom: DM, die of colon CA  Social -Live w daughter and grandson -Work as a Land for 56 y -no alcohol in 6 mo -no smoking -no drugs -No PCP  PMH -Cirrhosis -liver lesion  Meds -Lasix  -Spiro  -Hydralazine -carvedilol  No issue obtain meds. Report adherence to meds

## 2022-04-18 NOTE — ED Notes (Signed)
Patient ambulated to the bathroom with a steady gait

## 2022-04-19 ENCOUNTER — Other Ambulatory Visit (HOSPITAL_COMMUNITY): Payer: Self-pay

## 2022-04-19 LAB — CBC
HCT: 38.5 % (ref 36.0–46.0)
Hemoglobin: 14.1 g/dL (ref 12.0–15.0)
MCH: 37 pg — ABNORMAL HIGH (ref 26.0–34.0)
MCHC: 36.6 g/dL — ABNORMAL HIGH (ref 30.0–36.0)
MCV: 101 fL — ABNORMAL HIGH (ref 80.0–100.0)
Platelets: 49 10*3/uL — ABNORMAL LOW (ref 150–400)
RBC: 3.81 MIL/uL — ABNORMAL LOW (ref 3.87–5.11)
RDW: 13.5 % (ref 11.5–15.5)
WBC: 6.6 10*3/uL (ref 4.0–10.5)
nRBC: 0 % (ref 0.0–0.2)

## 2022-04-19 LAB — COMPREHENSIVE METABOLIC PANEL
ALT: 106 U/L — ABNORMAL HIGH (ref 0–44)
AST: 137 U/L — ABNORMAL HIGH (ref 15–41)
Albumin: 2.6 g/dL — ABNORMAL LOW (ref 3.5–5.0)
Alkaline Phosphatase: 93 U/L (ref 38–126)
Anion gap: 8 (ref 5–15)
BUN: 19 mg/dL (ref 6–20)
CO2: 27 mmol/L (ref 22–32)
Calcium: 8.7 mg/dL — ABNORMAL LOW (ref 8.9–10.3)
Chloride: 98 mmol/L (ref 98–111)
Creatinine, Ser: 1.07 mg/dL — ABNORMAL HIGH (ref 0.44–1.00)
GFR, Estimated: >60 mL/min (ref >60)
Glucose, Bld: 214 mg/dL — ABNORMAL HIGH (ref 70–99)
Potassium: 4.3 mmol/L (ref 3.5–5.1)
Sodium: 133 mmol/L — ABNORMAL LOW (ref 135–145)
Total Bilirubin: 4 mg/dL — ABNORMAL HIGH (ref 0.3–1.2)
Total Protein: 6.2 g/dL — ABNORMAL LOW (ref 6.5–8.1)

## 2022-04-19 LAB — HEMOGLOBIN A1C
Hgb A1c MFr Bld: 10.4 % — ABNORMAL HIGH (ref 4.8–5.6)
Mean Plasma Glucose: 251.78 mg/dL

## 2022-04-19 LAB — APTT: aPTT: 30 seconds (ref 24–36)

## 2022-04-19 LAB — CBG MONITORING, ED
Glucose-Capillary: 163 mg/dL — ABNORMAL HIGH (ref 70–99)
Glucose-Capillary: 197 mg/dL — ABNORMAL HIGH (ref 70–99)
Glucose-Capillary: 229 mg/dL — ABNORMAL HIGH (ref 70–99)

## 2022-04-19 LAB — GLUCOSE, CAPILLARY
Glucose-Capillary: 207 mg/dL — ABNORMAL HIGH (ref 70–99)
Glucose-Capillary: 229 mg/dL — ABNORMAL HIGH (ref 70–99)
Glucose-Capillary: 222 mg/dL — ABNORMAL HIGH (ref 70–99)
Glucose-Capillary: 317 mg/dL — ABNORMAL HIGH (ref 70–99)
Glucose-Capillary: 270 mg/dL — ABNORMAL HIGH (ref 70–99)

## 2022-04-19 LAB — PROTIME-INR
INR: 1.3 — ABNORMAL HIGH (ref 0.8–1.2)
Prothrombin Time: 15.6 seconds — ABNORMAL HIGH (ref 11.4–15.2)

## 2022-04-19 LAB — MAGNESIUM: Magnesium: 1.9 mg/dL (ref 1.7–2.4)

## 2022-04-19 MED ORDER — ACCU-CHEK SOFTCLIX LANCETS MISC
0 refills | Status: DC
  Filled 2022-04-19: qty 100, 30d supply, fill #0

## 2022-04-19 MED ORDER — LIVING WELL WITH DIABETES BOOK
Freq: Once | Status: AC
  Filled 2022-04-19: qty 1

## 2022-04-19 MED ORDER — ACCU-CHEK SOFTCLIX LANCETS MISC
0 refills | Status: AC
  Filled 2022-06-08: qty 100, 25d supply, fill #0
  Filled 2022-07-19: qty 100, 25d supply, fill #1

## 2022-04-19 MED ORDER — ACCU-CHEK GUIDE W/DEVICE KIT
PACK | 0 refills | Status: AC
  Filled 2022-06-08: qty 1, 30d supply, fill #0

## 2022-04-19 MED ORDER — INSULIN STARTER KIT- PEN NEEDLES (ENGLISH)
1.0000 | Freq: Once | Status: AC
  Administered 2022-04-19: 1
  Filled 2022-04-19: qty 1

## 2022-04-19 MED ORDER — EMPAGLIFLOZIN 10 MG PO TABS
10.0000 mg | ORAL_TABLET | Freq: Every day | ORAL | 0 refills | Status: DC
  Filled 2022-04-19: qty 30, 30d supply, fill #0

## 2022-04-19 MED ORDER — GLUCOSE BLOOD VI STRP
ORAL_STRIP | 0 refills | Status: DC
  Filled 2022-04-19: qty 100, 30d supply, fill #0

## 2022-04-19 MED ORDER — INSULIN GLARGINE-YFGN 100 UNIT/ML ~~LOC~~ SOLN
10.0000 [IU] | Freq: Every day | SUBCUTANEOUS | Status: DC
  Administered 2022-04-19: 10 [IU] via SUBCUTANEOUS
  Filled 2022-04-19: qty 0.1

## 2022-04-19 MED ORDER — GLUCOSE BLOOD VI STRP
ORAL_STRIP | 0 refills | Status: DC
  Filled 2022-06-08: qty 100, 25d supply, fill #0

## 2022-04-19 MED ORDER — INSULIN GLARGINE-YFGN 100 UNIT/ML ~~LOC~~ SOPN
15.0000 [IU] | PEN_INJECTOR | Freq: Every day | SUBCUTANEOUS | 0 refills | Status: DC
  Filled 2022-04-19: qty 6, 30d supply, fill #0

## 2022-04-19 MED ORDER — INSULIN ASPART 100 UNIT/ML IJ SOLN
0.0000 [IU] | Freq: Three times a day (TID) | INTRAMUSCULAR | Status: DC
  Administered 2022-04-19: 5 [IU] via SUBCUTANEOUS
  Administered 2022-04-19: 8 [IU] via SUBCUTANEOUS
  Administered 2022-04-19: 11 [IU] via SUBCUTANEOUS

## 2022-04-19 MED ORDER — PENTIPS 32G X 4 MM MISC
1.0000 | Freq: Once | 0 refills | Status: AC
  Filled 2022-04-19: qty 100, 30d supply, fill #0

## 2022-04-19 MED ORDER — ACCU-CHEK GUIDE W/DEVICE KIT
PACK | 0 refills | Status: DC
  Filled 2022-04-19: qty 1, 30d supply, fill #0

## 2022-04-19 MED ORDER — INSULIN PEN NEEDLE 32G X 4 MM MISC
1.0000 | Freq: Every day | 0 refills | Status: DC
  Filled 2022-04-19: qty 100, fill #0
  Filled 2022-05-01: qty 100, 90d supply, fill #0
  Filled 2022-06-08: qty 100, 100d supply, fill #0

## 2022-04-19 NOTE — Progress Notes (Signed)
Internal Medicine Attending:   I saw and examined the patient. I reviewed the resident's H and P note and I agree with the resident's findings and plan as documented in the resident's note.  In brief, patient is a 56 year old female with a past medical history of cirrhosis likely secondary to alcohol use, hypertension, chronic hepatitis C who presented to the ED with nausea and vomiting as well as blurry vision x2 days and polyuria and polydipsia x1 day.  Patient also complained of worsening cough with white phlegm that started a couple weeks ago.  No fevers or chills, no chest pain, no shortness of breath, no palpitations, no lightheadedness, syncope.  Patient was admitted to the hospital in April to May for hypertensive urgency with liver cirrhosis and ascites and has not used alcohol in 6 months and has changed her diet to more heart healthy options.  She states that she is compliant with all her medications.  In the ED, patient was noted to have a blood glucose greater than 600 with a normal anion gap and bicarb.  She was admitted to the hospital for further management of her HHS.  Today, patient states that she feels much better but still has some mild blurry vision.  Vitals:   04/19/22 0800 04/19/22 1229  BP: 135/73 (!) 143/99  Pulse: 74 84  Resp: 15 17  Temp: 98.1 F (36.7 C) 97.9 F (36.6 C)  SpO2: 97% 96%   On exam, patient is lying in bed in no apparent distress.  Cardiovascular exam reveals regular rate and rhythm with normal heart sounds.  Lungs are clear to auscultation bilaterally.  Abdomen is soft, nontender, nondistended with normoactive bowel sounds.  Lower extremities with trace bilateral lower extremity edema noted and nontender to palpation.  Patient has a normal mood and affect.  Patient was admitted to the hospital with nausea/vomiting, polyuria and polydipsia as well as blurry vision and was noted to have a blood sugar greater than 600 with a normal bicarb and no anion gap  consistent with the presentation of HHS.  Patient was given IV fluids and started on insulin drip in the ED.  Patient was taken off the insulin drip last night and appears to be doing well currently.  We will start the patient on Lantus 15 units daily as well as empagliflozin 10 mg daily.  Patient was seen by diabetes educator and taught how to inject herself as well as how to check her blood sugars.  No further work-up at this time.  Of note, patient did have an AKI on admission with a creatinine 1.5.  Creatinine today was 1.07 and improved with IV fluids.  No further work-up at this time I suspect this is likely prerenal secondary to her HHS.  Patient also noted to have elevated LFTs with AST and ALT in the 100s likely secondary to her underlying cirrhosis and active hepatitis C.  Patient will need to follow-up with GI/ID as an outpatient for treatment of her hepatitis C.  No further work-up at this time.  Patient to be stable for DC home today.

## 2022-04-19 NOTE — Plan of Care (Signed)
  Problem: Education: Goal: Ability to describe self-care measures that may prevent or decrease complications (Diabetes Survival Skills Education) will improve Outcome: Progressing Goal: Individualized Educational Video(s) Outcome: Progressing   

## 2022-04-19 NOTE — Discharge Summary (Signed)
Name: Mary Anderson MRN: [id number] DOB: [birth year] 56 y.o. PCP: Camillia Herter, NP  Date of Admission: 04/18/2022 11:24 AM Date of Discharge: 04/19/2022 Attending Physician: Aldine Contes, MD  Discharge Diagnosis: Principal Problem:   Hyperosmolar hyperglycemic state (HHS) [facility]) Active Problems:   Hypertension   Chronic hepatitis C ([place])   Cirrhosis ([place])   Thrombocytopenia (H[facility])   Elevated LFTs     Discharge Medications: Allergies as of 04/19/2022   No Known Allergies      Medication List     STOP taking these medications    hydrALAZINE 25 MG tablet Commonly known as: APRESOLINE       TAKE these medications    Accu-Chek Guide w/Device Kit Use as directed up to four times daily   Accu-Chek Softclix Lancets lancets Use as directed up to four times daily   acetaminophen 500 MG tablet Commonly known as: TYLENOL Take 500-1,000 mg by mouth every 8 (eight) hours as needed for mild pain.   carvedilol 6.25 MG tablet Commonly known as: COREG Take 1 tablet (6.25 mg total) by mouth 2 (two) times daily with a meal.   empagliflozin 10 MG Tabs tablet Commonly known as: JARDIANCE Take 1 tablet (10 mg total) by mouth daily before breakfast.   folic acid 1 MG tablet Commonly known as: FOLVITE TAKE 1 TABLET BY MOUTH EVERY DAY   furosemide 40 MG tablet Commonly known as: LASIX Take 1 tablet (40 mg total) by mouth daily.   glucose blood test strip Use as directed up to four times daily.   ibuprofen 200 MG tablet Commonly known as: ADVIL Take 200-400 mg by mouth daily as needed (pain).   insulin glargine-yfgn 100 UNIT/ML Pen Commonly known as: SEMGLEE Inject 15 Units into the skin daily. Start taking on: April 20, 2022   Pentips 32G X 4 MM Misc Generic drug: Insulin Pen Needle Use as directed up to four times daily.   Insulin Pen Needle 32G X 4 MM Misc 1 Needle by Does not apply route daily.   promethazine-dextromethorphan 6.25-15 MG/5ML  syrup Commonly known as: PROMETHAZINE-DM Take 5 mLs by mouth 3 (three) times daily as needed for cough.   spironolactone 100 MG tablet Commonly known as: ALDACTONE Take 1 tablet (100 mg total) by mouth daily.          Disposition and follow-up:    Mary Anderson was discharged from [facility] in Good condition.  At the hospital follow up visit please address:   1.  Type 2 diabetes - presented with HHS. A1c 10.4%. Discharging with semglee 15u daily and jardiance 13m daily. Ensure medication adherence and tolerance at hospital f/u visit. [facility]  2.  Cirrhosis, chronic Hep C - has appointment with Gastro Dr. WAllen Norrison 06/07/2022.  4. HTN - holding hydralazine at DC. Continued home coreg along with lasix and spironolactone. Consider restarting hydralazine at hospital f/u if BP remains elevated.  4. Labs / imaging needed at time of follow-up: CBC, BMP    Follow-up Appointments:    Hospital Course by problem list:  #HHS: Admitted with nausea, vomiting, blurry vision, polydipsia, polyuria. She was found to have serum glucose >600 on admission and glucosuria >500. Normal pH, normal bicarbonate, normal AG. Endotool was initiated and she received IVF resuscitation. Taken off of insulin drip overnight and transitioned to semglee 10u. She is doing well today. Will discharge with semglee 15u daily and jardiance 136mdaily. Decided against metformin given liver  disease. Seen by diabetes educator and taught how to inject herself with insulin and how to check blood sugars. She will follow up at [facility] for hospital follow up.  #AKI - resolved Found to have creatinine of 1.5 on admission, likely prerenal in the setting of hypovolemia given presentation of HHS. Improved with IVF rehydration and she is tolerating PO intake well. Recheck kidney function at hospital follow up.  #Cirrhosis/ Chronic Hep C / Thrombocytopenia:  Chronic and stable. Has outpatient GI follow up  scheduled with Dr. Allen Norris on 06/07/2022.  #HTN:  Given hypovolemia and normotensive status on admission, held antihypertensive and diuretic medications on admission. With mild elevations in blood pressure and euvolemia on discharge, instructed patient to restart carvedilol, furosemide, and spironolactone at home. Will continue to hold hydralazine and follow up with PCP about re-starting it based on blood pressures.    Discharge Exam:    BP (!) 143/99   Pulse 84   Temp 97.9 F (36.6 C) (Oral)   Resp 17   Ht 5' 4"  (1.626 m)   Wt 76.7 kg   LMP 04/22/2011   SpO2 96%   BMI 29.01 kg/m   Subjective: Patient reports Feeling much better this morning. Denies nausea, vomiting, urinary frequency or urgency. Still endorses some blurry vision but states that it has improved   Overnight, weaned per Endotool Protocol from fluids and insulin drip, started on subcutaneous insulin.   Physical Exam Constitutional:      Appearance: Normal appearance.  HENT:     Head: Normocephalic and atraumatic.     Mouth/Throat:     Mouth: Mucous membranes are moist.     Pharynx: Oropharynx is clear. No oropharyngeal exudate.  Eyes:     General: No scleral icterus.    Extraocular Movements: Extraocular movements intact.     Conjunctiva/sclera: Conjunctivae normal.     Pupils: Pupils are equal, round, and reactive to light.  Cardiovascular:     Rate and Rhythm: Normal rate and regular rhythm.     Pulses: Normal pulses.     Heart sounds: Normal heart sounds. No murmur heard.    No friction rub. No gallop.  Pulmonary:     Effort: Pulmonary effort is normal.     Breath sounds: Normal breath sounds.  Abdominal:     General: There is no distension.     Palpations: Abdomen is soft.     Tenderness: There is no abdominal tenderness.  Musculoskeletal:        General: Normal range of motion.     Cervical back: Normal range of motion.     Comments: Trace edema in bilateral LE.  Skin:    General: Skin is warm and  dry.  Neurological:     General: No focal deficit present.     Mental Status: She is alert and oriented to person, place, and time.  Psychiatric:        Mood and Affect: Mood normal.        Behavior: Behavior normal.      Pertinent Labs, Studies, and Procedures:   Labs:   CBG (last 3)  Recent Labs (last 2 labs)[] Expand by Default       Recent Labs    04/19/22 0422 04/19/22 0536 04/19/22 0833  GLUCAP 207* 229* 222*      A1c 10.4   CMP   04/19/22 04:32  Sodium 133 (L)  Potassium 4.3  Chloride 98  CO2 27  Glucose 214 (H)  BUN 19  Creatinine  1.07 (H)  Calcium 8.7 (L)  Anion gap 8  Alkaline Phosphatase 93  Albumin 2.6 (L)  AST 137 (H)  ALT 106 (H)  Total Protein 6.2 (L)  Total Bilirubin 4.0 (H)    CBC   04/19/22 04:32  WBC 6.6  RBC 3.81 (L)  Hemoglobin 14.1  HCT 38.5  MCV 101.0 (H)  MCH 37.0 (H)  MCHC 36.6 (H)  RDW 13.5  Platelets 49 (L)  nRBC 0.0    Coagulation studies   Most Recent  Prothrombin Time 15.6 (H) 04/19/22 05:40  INR 1.3 (H) 04/19/22 05:40  APTT 30 04/19/22 05:40    Magnesium   04/19/22 07:10  Magnesium 1.9   Urinalysis    Component Value Date/Time   COLORURINE YELLOW 04/18/2022 1140   APPEARANCEUR CLEAR 04/18/2022 1140   APPEARANCEUR Turbid (A) 08/29/2017 1527   LABSPEC 1.017 04/18/2022 1140   PHURINE 5.0 04/18/2022 1140   GLUCOSEU >=500 (A) 04/18/2022 1140   HGBUR MODERATE (A) 04/18/2022 1140   BILIRUBINUR NEGATIVE 04/18/2022 1140   BILIRUBINUR Negative 08/29/2017 1532   BILIRUBINUR Negative 08/29/2017 1527   KETONESUR NEGATIVE 04/18/2022 1140   PROTEINUR NEGATIVE 04/18/2022 1140   UROBILINOGEN 0.2 08/29/2017 1532   NITRITE NEGATIVE 04/18/2022 1140   LEUKOCYTESUR NEGATIVE 04/18/2022 1140    Lab Results  Component Value Date   HGBA1C 10.4 (H) 04/18/2022    ______________________  Imaging: DG Chest 2 View: 04/18/2022 IMPRESSION: No acute cardiopulmonary  disease.  ______________________  Procedures: None  ______________________   Discharge Instructions:  Dear Ms. Magner,   It was a pleasure taking care of you during this hospitalization. You were admitted with very high blood sugars and received intravenous fluids and insulin to bring your blood sugar level down. This episode confirms a diagnosis of diabetes mellitus. At home, please give yourself 15 units of the insulin (Semglee) every day. Please take one pill of the empagliflozin (Jardiance) every day. You will work with your primary care provider to adjust medications as needed until you find a regimen that works best to manage your diabetes.  We look forward to seeing you at the [facility] Practice, you will receive a phone call with appointment information.    Virl Axe, MD [facility] PGY-3 04/19/2022, 3:55 PM

## 2022-04-19 NOTE — Progress Notes (Signed)
  Transition of Care [facility]) Screening Note   Patient Details  Name: Mary Anderson Date of Birth: July 07, 1965   Transition of Care [facility]) CM/SW Contact:    Cyndi Bender, RN Phone Number: 04/19/2022, 8:28 AM    Transition of Care Department [facility]) has reviewed patient.  Patient has no insurance. Will follow for MATCH needs.  We will continue to monitor patient advancement through interdisciplinary progression rounds.  If new patient transition needs arise, please place a TOC consult.

## 2022-04-19 NOTE — ED Notes (Signed)
ED TO INPATIENT HANDOFF REPORT  ED Nurse Name and Phone #: Delitha Elms  S Name/Age/Gender Mary Anderson 56 y.o. female Room/Bed: 001C/001C  Code Status   Code Status: Full Code  Home/SNF/Other Home Patient oriented to: self, place, time, and situation Is this baseline? Yes   Triage Complete: Triage complete  Chief Complaint Hyperosmolar hyperglycemic state (HHS) ([place]) [E11.00]  Triage Note Patient reports no hx of DM but for past 3 days has been having n/v blurred vision and dizziness. Patient went to UC and glucose >600.  [place] respirations noted.    Allergies No Known Allergies  Level of Care/Admitting Diagnosis ED Disposition     ED Disposition  Admit   Condition  --   Comment  Hospital Area: [place] [[id number]]  Level of Care: Progressive [102]  Admit to Progressive based on following criteria: GI, ENDOCRINE disease patients with GI bleeding, acute liver failure or pancreatitis, stable with diabetic ketoacidosis or thyrotoxicosis (hypothyroid) state.  May place patient in observation at [facility] or [place] if equivalent level of care is available:: No  Covid Evaluation: Asymptomatic - no recent exposure (last 10 days) testing not required  Diagnosis: Hyperosmolar hyperglycemic state (HHS) [facility]) [[id number]]  Admitting Physician: Aldine Contes [[id number]]  Attending Physician: Aldine Contes [[id number]]          B Medical/Surgery History Past Medical History:  Diagnosis Date   Hypertension    Past Surgical History:  Procedure Laterality Date   ESOPHAGOGASTRODUODENOSCOPY (EGD) WITH PROPOFOL N/A 01/30/2022   Procedure: ESOPHAGOGASTRODUODENOSCOPY (EGD) WITH PROPOFOL;  Surgeon: Lucilla Lame, MD;  Location: [facility] ENDOSCOPY;  Service: Endoscopy;  Laterality: N/A;   IR THORACENTESIS ASP PLEURAL SPACE W/IMG GUIDE  10/27/2021     A IV Location/Drains/Wounds Patient Lines/Drains/Airways Status     Active Line/Drains/Airways     Name  Placement date Placement time Site Days   Peripheral IV 04/18/22 20 G Anterior;Left;Proximal Forearm 04/18/22  1243  Forearm  1            Intake/Output Last 24 hours  Intake/Output Summary (Last 24 hours) at 04/19/2022 0216 Last data filed at 04/18/2022 1610 Gross per 24 hour  Intake 1750 ml  Output --  Net 1750 ml    Labs/Imaging Results for orders placed or performed during the hospital encounter of 04/18/22 (from the past 48 hour(s))  Urinalysis, Routine w reflex microscopic     Status: Abnormal   Collection Time: 04/18/22 11:40 AM  Result Value Ref Range   Color, Urine YELLOW YELLOW   APPearance CLEAR CLEAR   Specific Gravity, Urine 1.017 1.005 - 1.030   pH 5.0 5.0 - 8.0   Glucose, UA >=500 (A) NEGATIVE mg/dL   Hgb urine dipstick MODERATE (A) NEGATIVE   Bilirubin Urine NEGATIVE NEGATIVE   Ketones, ur NEGATIVE NEGATIVE mg/dL   Protein, ur NEGATIVE NEGATIVE mg/dL   Nitrite NEGATIVE NEGATIVE   Leukocytes,Ua NEGATIVE NEGATIVE   RBC / HPF 0-5 0 - 5 RBC/hpf   WBC, UA 0-5 0 - 5 WBC/hpf   Bacteria, UA NONE SEEN NONE SEEN   Squamous Epithelial / LPF 0-5 0 - 5   Hyaline Casts, UA PRESENT     Comment: Performed at [facility], [place] [address], [place], [place] [address]  CBC with Differential     Status: Abnormal   Collection Time: 04/18/22 11:50 AM  Result Value Ref Range   WBC 6.2 4.0 - 10.5 K/uL   RBC 4.00 3.87 - 5.11 MIL/uL  Hemoglobin 14.1 12.0 - 15.0 g/dL   HCT 62.9 52.8 - 41.3 %   MCV 104.3 (H) 80.0 - 100.0 fL   MCH 35.3 (H) 26.0 - 34.0 pg   MCHC 33.8 30.0 - 36.0 g/dL   RDW 24.4 01.0 - 27.2 %   Platelets 64 (L) 150 - 400 K/uL    Comment: Immature Platelet Fraction may be clinically indicated, consider ordering this additional test [id number] REPEATED TO VERIFY    nRBC 0.0 0.0 - 0.2 %   Neutrophils Relative % 73 %   Neutro Abs 4.6 1.7 - 7.7 K/uL   Lymphocytes Relative 12 %   Lymphs Abs 0.8 0.7 - 4.0 K/uL   Monocytes Relative 12 %   Monocytes  Absolute 0.7 0.1 - 1.0 K/uL   Eosinophils Relative 2 %   Eosinophils Absolute 0.1 0.0 - 0.5 K/uL   Basophils Relative 0 %   Basophils Absolute 0.0 0.0 - 0.1 K/uL   Immature Granulocytes 1 %   Abs Immature Granulocytes 0.04 0.00 - 0.07 K/uL    Comment: Performed at [facility], [address] [place], [place] [address]  Comprehensive metabolic panel     Status: Abnormal   Collection Time: 04/18/22 11:50 AM  Result Value Ref Range   Sodium 126 (L) 135 - 145 mmol/L   Potassium 5.0 3.5 - 5.1 mmol/L   Chloride 91 (L) 98 - 111 mmol/L   CO2 26 22 - 32 mmol/L   Glucose, Bld 620 (HH) 70 - 99 mg/dL    Comment: CRITICAL RESULT CALLED TO, READ BACK BY AND VERIFIED WITH  J NICKERSON RN 04/18/2022 1300 B NUNNERY Glucose reference range applies only to samples taken after fasting for at least 8 hours.    BUN 28 (H) 6 - 20 mg/dL   Creatinine, Ser 9.56 (H) 0.44 - 1.00 mg/dL   Calcium 8.7 (L) 8.9 - 10.3 mg/dL   Total Protein 7.0 6.5 - 8.1 g/dL   Albumin 3.0 (L) 3.5 - 5.0 g/dL   AST 387 (H) 15 - 41 U/L   ALT 117 (H) 0 - 44 U/L   Alkaline Phosphatase 98 38 - 126 U/L   Total Bilirubin 5.7 (H) 0.3 - 1.2 mg/dL   GFR, Estimated 41 (L) >60 mL/min    Comment: (NOTE) Calculated using the CKD-EPI Creatinine Equation (2021)    Anion gap 9 5 - 15    Comment: Performed at [facility], [address], [place], [place] [address]  Lipase, blood     Status: None   Collection Time: 04/18/22 11:50 AM  Result Value Ref Range   Lipase 47 11 - 51 U/L    Comment: Performed at [facility], [address] [place], [place] [address]  Magnesium     Status: Abnormal   Collection Time: 04/18/22 11:50 AM  Result Value Ref Range   Magnesium 1.5 (L) 1.7 - 2.4 mg/dL    Comment: Performed at [facility], [address], [place], [place] [address]  CBG monitoring, ED     Status: Abnormal   Collection Time: 04/18/22 11:57 AM  Result Value Ref Range   Glucose-Capillary >600 (HH) 70 - 99 mg/dL     Comment: Glucose reference range applies only to samples taken after fasting for at least 8 hours.  I-Stat beta hCG blood, ED     Status: None   Collection Time: 04/18/22 11:58 AM  Result Value Ref Range   I-stat hCG,  quantitative <5.0 <5 mIU/mL   Comment 3            Comment:   GEST. AGE      CONC.  (mIU/mL)   <=1 WEEK        5 - 50     2 WEEKS       50 - 500     3 WEEKS       100 - 10,000     4 WEEKS     1,000 - 30,000        FEMALE AND NON-PREGNANT FEMALE:     LESS THAN 5 mIU/mL   I-Stat venous blood gas, [facility] ED only)     Status: Abnormal   Collection Time: 04/18/22 12:00 PM  Result Value Ref Range   pH, Ven 7.429 7.25 - 7.43   pCO2, Ven 39.3 (L) 44 - 60 mmHg   pO2, Ven 33 32 - 45 mmHg   Bicarbonate 26.0 20.0 - 28.0 mmol/L   TCO2 27 22 - 32 mmol/L   O2 Saturation 65 %   Acid-Base Excess 2.0 0.0 - 2.0 mmol/L   Sodium 125 (L) 135 - 145 mmol/L   Potassium 4.9 3.5 - 5.1 mmol/L   Calcium, Ion 1.09 (L) 1.15 - 1.40 mmol/L   HCT 44.0 36.0 - 46.0 %   Hemoglobin 15.0 12.0 - 15.0 g/dL   Sample type VENOUS    Comment NOTIFIED PHYSICIAN   Type and screen [facility]     Status: None   Collection Time: 04/18/22 12:45 PM  Result Value Ref Range   ABO/RH(D) O POS    Antibody Screen NEG    Sample Expiration      04/21/2022,2359 Performed at [facility], [address], [place], [place] [address]   Beta-hydroxybutyric acid     Status: Abnormal   Collection Time: 04/18/22  1:13 PM  Result Value Ref Range   Beta-Hydroxybutyric Acid 0.52 (H) 0.05 - 0.27 mmol/L    Comment: Performed at [facility], [address], [place], [place] [address]  Osmolality     Status: Abnormal   Collection Time: 04/18/22  1:14 PM  Result Value Ref Range   Osmolality 304 (H) 275 - 295 mOsm/kg    Comment: REPEATED TO VERIFY Performed at [facility], [address] [place], [place] [address]   CBG monitoring, ED     Status: Abnormal   Collection Time: 04/18/22   2:37 PM  Result Value Ref Range   Glucose-Capillary 495 (H) 70 - 99 mg/dL    Comment: Glucose reference range applies only to samples taken after fasting for at least 8 hours.  CBG monitoring, ED     Status: Abnormal   Collection Time: 04/18/22  3:06 PM  Result Value Ref Range   Glucose-Capillary 467 (H) 70 - 99 mg/dL    Comment: Glucose reference range applies only to samples taken after fasting for at least 8 hours.  CBG monitoring, ED     Status: Abnormal   Collection Time: 04/18/22  3:38 PM  Result Value Ref Range   Glucose-Capillary 464 (H) 70 - 99 mg/dL    Comment: Glucose reference range applies only to samples taken after fasting for at least 8 hours.  CBG monitoring, ED     Status: Abnormal   Collection Time: 04/18/22  4:18 PM  Result Value Ref Range   Glucose-Capillary 448 (H) 70 - 99 mg/dL    Comment: Glucose  reference range applies only to samples taken after fasting for at least 8 hours.  CBG monitoring, ED     Status: Abnormal   Collection Time: 04/18/22  5:25 PM  Result Value Ref Range   Glucose-Capillary 414 (H) 70 - 99 mg/dL    Comment: Glucose reference range applies only to samples taken after fasting for at least 8 hours.  CBG monitoring, ED     Status: Abnormal   Collection Time: 04/18/22  6:28 PM  Result Value Ref Range   Glucose-Capillary 375 (H) 70 - 99 mg/dL    Comment: Glucose reference range applies only to samples taken after fasting for at least 8 hours.  CBG monitoring, ED     Status: Abnormal   Collection Time: 04/18/22  7:36 PM  Result Value Ref Range   Glucose-Capillary 293 (H) 70 - 99 mg/dL    Comment: Glucose reference range applies only to samples taken after fasting for at least 8 hours.  CBG monitoring, ED     Status: Abnormal   Collection Time: 04/18/22  8:49 PM  Result Value Ref Range   Glucose-Capillary 234 (H) 70 - 99 mg/dL    Comment: Glucose reference range applies only to samples taken after fasting for at least 8 hours.  CBG  monitoring, ED     Status: Abnormal   Collection Time: 04/18/22 10:20 PM  Result Value Ref Range   Glucose-Capillary 214 (H) 70 - 99 mg/dL    Comment: Glucose reference range applies only to samples taken after fasting for at least 8 hours.  CBG monitoring, ED     Status: Abnormal   Collection Time: 04/19/22 12:02 AM  Result Value Ref Range   Glucose-Capillary 229 (H) 70 - 99 mg/dL    Comment: Glucose reference range applies only to samples taken after fasting for at least 8 hours.  CBG monitoring, ED     Status: Abnormal   Collection Time: 04/19/22  1:52 AM  Result Value Ref Range   Glucose-Capillary 197 (H) 70 - 99 mg/dL    Comment: Glucose reference range applies only to samples taken after fasting for at least 8 hours.   DG Chest 2 View  Result Date: 04/18/2022 CLINICAL DATA:  Cirrhosis. Shortness of breath. Concern for fluid overload. EXAM: CHEST - 2 VIEW COMPARISON:  03/26/2022 FINDINGS: Multiple wires and leads project over the chest on the frontal view. Mild right hemidiaphragm elevation. Midline trachea. Normal heart size and mediastinal contours. No pleural effusion or pneumothorax. Clear lungs. No congestive failure. IMPRESSION: No acute cardiopulmonary disease. Electronically Signed   By: Jeronimo Greaves M.D.   On: 04/18/2022 13:15    Pending Labs Unresulted Labs (From admission, onward)     Start     Ordered   04/19/22 0500  CBC  Tomorrow morning,   R        04/18/22 1459   04/19/22 0500  Protime-INR  Tomorrow morning,   R        04/18/22 1459   04/19/22 0500  APTT  Tomorrow morning,   R        04/18/22 1459   04/19/22 0500  Hemoglobin A1c  Tomorrow morning,   R        04/18/22 1500   04/19/22 0500  Magnesium  Tomorrow morning,   R        04/18/22 1500   04/19/22 0500  Comprehensive metabolic panel  Tomorrow morning,   R  04/18/22 1621   04/18/22 1622  Bilirubin, fractionated(tot/dir/indir)  Add-on,   AD        04/18/22 1621             Vitals/Pain Today's Vitals   04/18/22 2000 04/18/22 2304 04/18/22 2306 04/19/22 0000  BP: 137/78 (!) 155/81  135/80  Pulse: 66 62  62  Resp: 15 10  15   Temp:   98 F (36.7 C)   TempSrc:   Oral   SpO2: 97% 97%  94%  Weight:      Height:      PainSc:        Isolation Precautions No active isolations  Medications Medications  insulin regular, human (MYXREDLIN) 100 units/ 100 mL infusion (1.8 Units/hr Intravenous Rate/Dose Change 04/19/22 0155)  dextrose 5 % in lactated ringers infusion ( Intravenous New Bag/Given 04/18/22 2127)  dextrose 50 % solution 0-50 mL (has no administration in time range)  lactated ringers infusion ( Intravenous New Bag/Given 04/18/22 1509)  enoxaparin (LOVENOX) injection 40 mg (40 mg Subcutaneous Given 04/19/22 0003)  lactated ringers bolus 500 mL (0 mLs Intravenous Stopped 04/18/22 1356)  potassium chloride 10 mEq in 100 mL IVPB (0 mEq Intravenous Stopped 04/18/22 1610)  magnesium sulfate IVPB 2 g 50 mL (0 g Intravenous Stopped 04/18/22 1508)  lactated ringers bolus 1,000 mL (0 mLs Intravenous Stopped 04/18/22 1507)    Mobility walks Low fall risk   Focused Assessments Hyperglycemia    R Recommendations: See Admitting Provider Note  Report given to:   Additional Notes:

## 2022-04-19 NOTE — Discharge Instructions (Addendum)
Dear Mary Anderson,   It was a pleasure taking care of you during this hospitalization. You were admitted with very high blood sugars and received intravenous fluids and insulin to bring your blood sugar level down. This episode confirms a diagnosis of diabetes mellitus. At home, please give yourself 15 units of the insulin (Semglee) every day. Please take one pill of the empagliflozin (Jardiance) every day. You will work with your primary care provider to adjust medications as needed until you find a regimen that works best to manage your diabetes.  We look forward to seeing you at the [facility] Internal Medicine Practice, you will receive a phone call with appointment information.

## 2022-04-19 NOTE — Progress Notes (Signed)
Inpatient Diabetes Program Recommendations  AACE/ADA: New Consensus Statement on Inpatient Glycemic Control (2015)  Target Ranges:  Prepandial:   less than 140 mg/dL      Peak postprandial:   less than 180 mg/dL (1-2 hours)      Critically ill patients:  140 - 180 mg/dL   Lab Results  Component Value Date   GLUCAP 317 (H) 04/19/2022   HGBA1C 10.4 (H) 04/18/2022    Review of Glycemic Control  Latest Reference Range & Units 04/19/22 03:04 04/19/22 04:22 04/19/22 05:36 04/19/22 08:33 04/19/22 12:28  Glucose-Capillary 70 - 99 mg/dL 163 (H) 207 (H) 229 (H) 222 (H) 317 (H)   Diabetes history: New diagnosis of DM Current orders for Inpatient glycemic control:  Novolog 0-15 units tid with meals Semglee 10 units daily  Inpatient Diabetes Program Recommendations:    Spoke with patient and spouse regarding DM management.        Spoke with pt about new diagnosis.  Discussed A1C results with her and explained what an A1C is, basic pathophysiology of DM Type 2, basic home care, importance of checking CBGs and maintaining good CBG control to prevent long-term and short-term complications.  Reviewed signs and symptoms of hyperglycemia and hypoglycemia. We also reviewed normal blood sugar levels 80-130 mg/dL in AM and <180 mg/dL post-prandial. Gave patient reli-on glucose meter for home use and demonstrated use.  RNs to provide ongoing basic DM education at bedside with this patient.  Have ordered educational booklet and insulin starter kit.       Educated patient and spouse on insulin pen use at home. Reviewed all steps if insulin pen including attachment of needle, 2-unit air shot, dialing up dose, giving injection, removing needle, disposal of sharps, storage of unused insulin, disposal of insulin etc.  Patient able to provide successful return demonstration.  Also reviewed troubleshooting with insulin pen.  MD to give patient Rxs for insulin pens and insulin pen needles.    Thanks,  Adah Perl,  RN, BC-ADM Inpatient Diabetes Coordinator Pager [phone]  (8a-5p)

## 2022-04-19 NOTE — TOC Initial Note (Addendum)
Transition of Care [facility]) - Initial/Assessment Note    Patient Details  Name: Mary Anderson MRN: [id number] Date of Birth: Nov 22, 1965  Transition of [facility]) CM/SW Contact:    Cyndi Bender, RN Phone Number: 04/19/2022, 12:57 PM  Clinical Narrative:                  Spoke to patient at bedside regarding transition needs.  Patient states she has a job but is unable to afford insurance through her job.  Patient states her PCP is Durene Fruits, NP who helps to make sure her medications are affordable.  Spoke to staff at Durene Fruits, NP office who state they can refer patient to [facility] pharmacy for financial assistance.  Patient has transportation home and to apts.  Watch for [facility] needs. TOC will continue to follow for needs.   [place] letter reinstated. Patient notified MATCH can not be used again for one year.   Expected Discharge Plan: Home/Self Care Barriers to Discharge: Continued Medical Work up   Patient Goals and CMS Choice Patient states their goals for this hospitalization and ongoing recovery are:: return home      Expected Discharge Plan and Services Expected Discharge Plan: Home/Self Care       Living arrangements for the past 2 months: Single Family Home                                      Prior Living Arrangements/Services Living arrangements for the past 2 months: Single Family Home Lives with:: Spouse Patient language and need for interpreter reviewed:: Yes Do you feel safe going back to the place where you live?: Yes      Need for Family Participation in Patient Care: Yes (Comment) Care giver support system in place?: Yes (comment)   Criminal Activity/Legal Involvement Pertinent to Current Situation/Hospitalization: No - Comment as needed  Activities of Daily Living Home Assistive Devices/Equipment: None ADL Screening (condition at time of admission) Patient's cognitive ability adequate to safely complete daily activities?: Yes Is  the patient deaf or have difficulty hearing?: No Does the patient have difficulty seeing, even when wearing glasses/contacts?: Yes Does the patient have difficulty concentrating, remembering, or making decisions?: No Patient able to express need for assistance with ADLs?: No Does the patient have difficulty dressing or bathing?: No Independently performs ADLs?: Yes (appropriate for developmental age) Communication: Independent Does the patient have difficulty walking or climbing stairs?: No Weakness of Legs: None Weakness of Arms/Hands: None  Permission Sought/Granted                  Emotional Assessment Appearance:: Appears stated age Attitude/Demeanor/Rapport: Engaged Affect (typically observed): Accepting Orientation: : Oriented to Self, Oriented to Place, Oriented to  Time, Oriented to Situation Alcohol / Substance Use: Not Applicable Psych Involvement: No (comment)  Admission diagnosis:  Hyperglycemia [R73.9] Hyperosmolar hyperglycemic state (HHS) ([place]) [E11.00] Patient Active Problem List   Diagnosis Date Noted   Hyperosmolar hyperglycemic state (HHS) ([place]) 04/18/2022   Hypokalemia 10/29/2021   Macrocytic anemia 10/29/2021   Anasarca 10/27/2021   Pleural effusion on right 10/27/2021   Elevated LFTs 10/27/2021   Hypoalbuminemia 10/27/2021   EtOH dependence ([place]) 10/27/2021   Malignant HTN with heart disease, w/o CHF, 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 [place] [address] Phone: [phone] Fax: [phone]  [facility] [address] [place]  [place] [address] Phone: [phone] Fax: [phone]     Social Determinants of Health (SDOH) Interventions    Readmission Risk Interventions     No data to display

## 2022-04-23 ENCOUNTER — Telehealth: Payer: Self-pay

## 2022-04-23 NOTE — Progress Notes (Addendum)
Patient ID: Mary Anderson, female    DOB: Aug 16, 1965  MRN: [id number]  CC: Hospital Discharge/Transition of Care Follow-Up  Subjective: Mary Anderson is a 56 y.o. female who presents for hospital discharge follow-up. She is accompanied by her husband, Mary Anderson.   Her concerns today include:  04/18/2022 - 04/19/2022 [facility] per MD note: Date of Admission: 04/18/2022 11:24 AM Date of Discharge: 04/19/2022 Attending Physician: Mary Contes, MD   Discharge Diagnosis: Principal Problem:   Hyperosmolar hyperglycemic state (HHS) [facility]) Active Problems:   Hypertension   Chronic hepatitis C ([place])   Cirrhosis ([place])   Thrombocytopenia ([place])   Elevated LFTs Disposition and follow-up:     [name] was discharged from [facility] in Good condition.  At the hospital follow up visit please address:     1.  Type 2 diabetes - presented with HHS. A1c 10.4%. Discharging with semglee 15u daily and jardiance 16m daily. Ensure medication adherence and tolerance at hospital f[facility]u visit[facility]   2.  Cirrhosis, chronic Hep C - has appointment with Gastro Dr. WAllen Anderson 06/07/2022.   4. HTN - holding hydralazine at DC. Continued home coreg along with lasix and spironolactone. Consider restarting hydralazine at hospital f/u if BP remains elevated.   4. Labs / imaging needed at time of follow-up: CBC, BMP   Hospital Course by problem list:   #HHS: Admitted with nausea, vomiting, blurry vision, polydipsia, polyuria. She was found to have serum glucose >600 on admission and glucosuria >500. Normal pH, normal bicarbonate, normal AG. Endotool was initiated and she received IVF resuscitation. Taken off of insulin drip overnight and transitioned to semglee 10u. She is doing well today. Will discharge with semglee 15u daily and jardiance 176mdaily. Decided against metformin given liver disease. Seen by diabetes educator and taught how to inject herself with insulin and  how to check blood sugars. [facility]   #AKI - resolved Found to have creatinine of 1.5 on admission, likely prerenal in the setting of hypovolemia given presentation of HHS. Improved with IVF rehydration and she is tolerating PO intake well. Recheck kidney function at hospital follow up.   #Cirrhosis/ Chronic Hep C / Thrombocytopenia:  Chronic and stable. Has outpatient GI follow up scheduled with Dr. WoAllen Norrisn 06/07/2022.   #HTN:  Given hypovolemia and normotensive status on admission, held antihypertensive and diuretic medications on admission. With mild elevations in blood pressure and euvolemia on discharge, instructed patient to restart carvedilol, furosemide, and spironolactone at home. Will continue to hold hydralazine and follow up with PCP about re-starting it based on blood pressures.   Dear Ms. Mary Anderson   It was a pleasure taking care of you during this hospitalization. You were admitted with very high blood sugars and received intravenous fluids and insulin to bring your blood sugar level down. This episode confirms a diagnosis of diabetes mellitus. At home, please give yourself 15 units of the insulin (Semglee) every day. Please take one pill of the empagliflozin (Jardiance) every day. You will work with your primary care provider to adjust medications as needed until you find a regimen that works best to manage your diabetes.   [facility], you will receive a phone call with appointment information.   Today's visit 05/01/2022: - Reports at hospital discharge she was initially taking Semglee three times daily. States she  was just made aware on 04/27/2022 that she was actually supposed to be taking Semglee once daily and has since then. Reports she has refills of Semglee available at her home. States homes blood sugars "up and down". Reports 150's - 300's. She is monitoring what she eats. Taking  Jardiance, no issues/concerns.  - Endorses bilateral eyes with fuzzy vision since hospital admission. Denies additional symptoms.  - Doing well on Coreg, Lasix, and Spironolactone, no issues/concerns. - Reports she is aware of appointment with her gastroenterologist on 06/07/2022. - Cough persisting. Has not established with Pulmonology since referral ordered. Requests refills of Promethazine-DM.   Patient Active Problem List   Diagnosis Date Noted   Hyperosmolar hyperglycemic state (HHS) ([place]) 04/18/2022   Hypokalemia 10/29/2021   Macrocytic anemia 10/29/2021   Anasarca 10/27/2021   Pleural effusion on right 10/27/2021   Elevated LFTs 10/27/2021   Hypoalbuminemia 10/27/2021   EtOH dependence ([place]) 10/27/2021   Malignant HTN with heart disease, Mary/o CHF, Mary/o chronic kidney disease 10/26/2021   Cirrhosis ([place]) 09/11/2017   Splenomegaly 09/11/2017   Thrombocytopenia ([place]) 09/11/2017   Calculus of gallbladder without cholecystitis without obstruction 09/10/2017   Chronic hepatitis C ([place]) 08/30/2017   Hypertension 07/31/2017     Current Outpatient Medications on File Prior to Visit  Medication Sig Dispense Refill   Accu-Chek Softclix Lancets lancets Use as directed up to four times daily 200 each 0   acetaminophen (TYLENOL) 500 MG tablet Take 500-1,000 mg by mouth every 8 (eight) hours as needed for mild pain.     Blood Glucose Monitoring Suppl (ACCU-CHEK GUIDE) Mary/Device KIT Use as directed up to four times daily 1 kit 0   glucose blood test strip Use as directed up to four times daily. 100 each 0   ibuprofen (ADVIL,MOTRIN) 200 MG tablet Take 200-400 mg by mouth daily as needed (pain).     insulin glargine-yfgn (SEMGLEE) 100 UNIT/ML Pen Inject 15 Units into the skin daily. 6 mL 0   Insulin Pen Needle 32G X 4 MM MISC 1 Needle by Does not apply route daily. 100 each 0   No current facility-administered medications on file prior to visit.    No Known Allergies  Social History    Socioeconomic History   Marital status: Married    Spouse name: Not on file   Number of children: Not on file   Years of education: Not on file   Highest education level: Not on file  Occupational History   Not on file  Tobacco Use   Smoking status: Never    Passive exposure: Never   Smokeless tobacco: Never  Vaping Use   Vaping Use: Never used  Substance and Sexual Activity   Alcohol use: Not Currently    Comment: no alcohol the past 6 months.   Drug use: No   Sexual activity: Not on file  Other Topics Concern   Not on file  Social History Narrative   Not on file   Social Determinants of Health   Financial Resource Strain: Not on file  Food Insecurity: No Food Insecurity (04/19/2022)   Hunger Vital Sign    Worried About Running Out of Food in the Last Year: Never true    Ran Out of Food in the Last Year: Never true  Transportation Needs: No Transportation Needs (04/19/2022)   PRAPARE - Hydrologist (Medical): No    Lack of Transportation (Non-Medical): No  Physical Activity: Not on file  Stress: Not  on file  Social Connections: Not on file  Intimate Partner Violence: Not At Risk (04/19/2022)   Humiliation, Afraid, Rape, and Kick questionnaire    Fear of Current or Ex-Partner: No    Emotionally Abused: No    Physically Abused: No    Sexually Abused: No    Family History  Problem Relation Age of Onset   Hypertension Mother    Colon cancer Mother    Hypertension Brother    Cancer Maternal Uncle    Cancer Maternal Uncle    Clotting disorder Neg Hx    Diabetes Neg Hx     Past Surgical History:  Procedure Laterality Date   ESOPHAGOGASTRODUODENOSCOPY (EGD) WITH PROPOFOL N/A 01/30/2022   Procedure: ESOPHAGOGASTRODUODENOSCOPY (EGD) WITH PROPOFOL;  Surgeon: Lucilla Lame, MD;  Location: [facility] ENDOSCOPY;  Service: Endoscopy;  Laterality: N/A;   IR THORACENTESIS ASP PLEURAL SPACE Mary/IMG GUIDE  10/27/2021    ROS: Review of  Systems Negative except as stated above  PHYSICAL EXAM: BP 131/85 (BP Location: Left Arm, Patient Position: Sitting, Cuff Size: Normal)   Pulse (!) 51   Temp 98.3 F (36.8 C)   Resp 16   Wt 165 lb (74.8 kg)   LMP 04/22/2011   SpO2 98%   BMI 28.32 kg/m   Physical Exam HENT:     Head: Normocephalic and atraumatic.  Eyes:     Extraocular Movements: Extraocular movements intact.     Conjunctiva/sclera: Conjunctivae normal.     Pupils: Pupils are equal, round, and reactive to light.  Cardiovascular:     Rate and Rhythm: Bradycardia present.     Pulses: Normal pulses.     Heart sounds: Normal heart sounds.  Pulmonary:     Effort: Pulmonary effort is normal.     Breath sounds: Normal breath sounds.  Musculoskeletal:     Cervical back: Normal range of motion and neck supple.  Neurological:     General: No focal deficit present.     Mental Status: She is alert and oriented to person, place, and time.  Psychiatric:        Mood and Affect: Mood normal.        Behavior: Behavior normal.    ASSESSMENT AND PLAN: 1. Hospital discharge follow-up - Reviewed hospital course, current medications, ensured proper follow-up in place, and addressed concerns.  - Update CBC and CMP. - CBC - CMP14+EGFR  2. Hyperosmolar hyperglycemic state (HHS) ([place]) 3. Uncontrolled type 2 diabetes mellitus with hyperglycemia (HCC) - Hemoglobin A1C 10.4% on 04/19/2022.  - Continue Insulin Glargine-YFGN as prescribed. No refills needed as of present.  - Continue Empagliflozin as prescribed.  - Discussed the importance of healthy eating habits, low-carbohydrate diet, low-sugar diet, regular aerobic exercise (at least 150 minutes a week as tolerated) and medication compliance to achieve or maintain control of diabetes. - Follow-up with primary provider in 2 to 4 weeks or sooner if needed. Will recheck hemoglobin A1C at that time.  - empagliflozin (JARDIANCE) 10 MG TABS tablet; Take 1 tablet (10 mg total) by  mouth daily before breakfast.  Dispense: 30 tablet; Refill: 2  4. Diabetic eye exam ([place]) 5. Blurry vision, bilateral - Referral to Ophthalmology for further evaluation and management.  - Ambulatory referral to Ophthalmology  6. Primary hypertension - Continue Carvedilol, Furosemide, and Spironolactone as prescribed. Will continue to hold Hydralazine as discussed during hospital discharge on 04/19/2022. - Counseled on blood pressure goal of less than 130/80, low-sodium, DASH diet, medication compliance, and 150 minutes of moderate  intensity exercise per week as tolerated. Counseled on medication adherence and adverse effects. - Follow-up with primary provider in 3 months or sooner if needed.  - carvedilol (COREG) 6.25 MG tablet; Take 1 tablet (6.25 mg total) by mouth 2 (two) times daily with a meal.  Dispense: 60 tablet; Refill: 2 - furosemide (LASIX) 40 MG tablet; Take 1 tablet (40 mg total) by mouth daily.  Dispense: 30 tablet; Refill: 2 - spironolactone (ALDACTONE) 100 MG tablet; Take 1 tablet (100 mg total) by mouth daily.  Dispense: 30 tablet; Refill: 2  7. Chronic hepatitis C without hepatic coma (HCC) 8. Hepatic cirrhosis, unspecified hepatic cirrhosis type, unspecified whether ascites present ([place]) 9. Alcoholic cirrhosis of liver with ascites ([place]) 10. Thrombocytopenia ([place]) 11. Elevated LFTs - Update CMP (see #1).  - Continue Furosemide and Spironolactone as prescribed.  - Keep all scheduled appointments with Gastroenterology.  - furosemide (LASIX) 40 MG tablet; Take 1 tablet (40 mg total) by mouth daily.  Dispense: 30 tablet; Refill: 2 - spironolactone (ALDACTONE) 100 MG tablet; Take 1 tablet (100 mg total) by mouth daily.  Dispense: 30 tablet; Refill: 2  12. Macrocytic anemia - Update CBC (see #1). - Continue Folic Acid as prescribed.  - Follow-up as needed.  - folic acid (FOLVITE) 1 MG tablet; Take 1 tablet (1 mg total) by mouth daily.  Dispense: 30 tablet; Refill: 2  13.  Chronic cough - Continue Promethazine-Dextromethorphan syrup as prescribed.  - Patient provided with contact information to Pulmonology referral [place], [place] [address] Phone # [phone]. - promethazine-dextromethorphan (PROMETHAZINE-DM) 6.25-15 MG/5ML syrup; Take 5 mLs by mouth 3 (three) times daily as needed for cough.  Dispense: 240 mL; Refill: 0     Patient was given the opportunity to ask questions.  Patient verbalized understanding of the plan and was able to repeat key elements of the plan. Patient was given clear instructions to go to Emergency Department or return to medical center if symptoms don't improve, worsen, or new problems develop.The patient verbalized understanding.   Orders Placed This Encounter  Procedures   CBC   CMP14+EGFR   Ambulatory referral to Ophthalmology     Requested Prescriptions   Signed Prescriptions Disp Refills   carvedilol (COREG) 6.25 MG tablet 60 tablet 2    Sig: Take 1 tablet (6.25 mg total) by mouth 2 (two) times daily with a meal.   furosemide (LASIX) 40 MG tablet 30 tablet 2    Sig: Take 1 tablet (40 mg total) by mouth daily.   spironolactone (ALDACTONE) 100 MG tablet 30 tablet 2    Sig: Take 1 tablet (100 mg total) by mouth daily.   empagliflozin (JARDIANCE) 10 MG TABS tablet 30 tablet 2    Sig: Take 1 tablet (10 mg total) by mouth daily before breakfast.   folic acid (FOLVITE) 1 MG tablet 30 tablet 2    Sig: Take 1 tablet (1 mg total) by mouth daily.   promethazine-dextromethorphan (PROMETHAZINE-DM) 6.25-15 MG/5ML syrup 240 mL 0    Sig: Take 5 mLs by mouth 3 (three) times daily as needed for cough.    Return for Follow-Up or next available 2 - 4 weeks chronic care mgmt.  Camillia Herter, NP

## 2022-04-23 NOTE — Telephone Encounter (Signed)
Transition Care Management Unsuccessful Follow-up Telephone Call  Date of discharge and from where:  04/19/2022, [facility]   Attempts:  1st Attempt  Reason for unsuccessful TCM follow-up call:  Left voice messages  on [phone] and  [phone], call back requested.   The patient has an appointment at [facility] with Durene Fruits, NP on 05/01/2022.

## 2022-04-24 ENCOUNTER — Telehealth: Payer: Self-pay

## 2022-04-24 NOTE — Telephone Encounter (Signed)
Transition Care Management Follow-up Telephone Call Date of discharge and from where: 04/19/2022, [facility] How have you been since you were released from the hospital? She said she is still weak but had to return to work.  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  She is new to insulin and said that she is able to check her own blood sugars. Her husband can assist if needed. She has been keeping a log of her blood sugars and what she has been eating. I instructed her to bring that log and her glucometer to her appointment at [facility] next week. She has been administering her own insulin and she said she has been giving the insulin 3x/day- breakfast, lunch and dinner. I explained to her that she is only supposed to be giving it once a day.  She was very surprised. I instructed her to review the instructions with the insulin as well as her AVS/discharge instructions and that will confirm the orders for her.  I instructed her to call back with any questions. She stated that her blood sugar was 98 this morning and she will continue to check that at least 3 x/day. She said that checking her blood sugars and giving insulin is " so much of a change" and she was concerned that she has already made a mistake. I encouraged her to continue to check her blood sugars as discussed and she said she will not take any more insulin today and going forward will only take it in the morning  Any questions or concerns? Yes- noted above regarding insulin  Items Reviewed: Did the pt receive and understand the discharge instructions provided? Yes  Medications obtained and verified? Yes - she said she has all of her medications Other? No  Any new allergies since your discharge? No  Dietary orders reviewed? Yes- she is trying to watch what she eats but she feels like she is always hungry. Do you have support at home? Yes - her husband  [place] and Equipment/Supplies: Were home health services ordered? no If so, what is the name  of the agency? N/a  Has the agency set up a time to come to the patient's home? not applicable Were any new equipment or medical supplies ordered?  Yes: glucometer What is the name of the medical supply agency? She received it while in the hospital Were you able to get the supplies/equipment? yes Do you have any questions related to the use of the equipment or supplies? No  Functional Questionnaire: (I = Independent and D = Dependent) ADLs: independent  Follow up appointments reviewed:  PCP Hospital f/u appt confirmed? Yes  Scheduled to see Durene Fruits, NP - 05/01/2022 Specialist Hospital f/u appt confirmed? Yes  Scheduled to see GI- 06/07/2022. Are transportation arrangements needed? No  If their condition worsens, is the pt aware to call PCP or go to the Emergency Dept.? Yes Was the patient provided with contact information for the PCP's office or ED? Yes Was to pt encouraged to call back with questions or concerns? Yes

## 2022-05-01 ENCOUNTER — Ambulatory Visit (INDEPENDENT_AMBULATORY_CARE_PROVIDER_SITE_OTHER): Payer: Self-pay | Admitting: Family

## 2022-05-01 ENCOUNTER — Other Ambulatory Visit: Payer: Self-pay

## 2022-05-01 VITALS — BP 131/85 | HR 51 | Temp 98.3°F | Resp 16 | Wt 165.0 lb

## 2022-05-01 DIAGNOSIS — D539 Nutritional anemia, unspecified: Secondary | ICD-10-CM

## 2022-05-01 DIAGNOSIS — K7031 Alcoholic cirrhosis of liver with ascites: Secondary | ICD-10-CM

## 2022-05-01 DIAGNOSIS — D696 Thrombocytopenia, unspecified: Secondary | ICD-10-CM

## 2022-05-01 DIAGNOSIS — Z0100 Encounter for examination of eyes and vision without abnormal findings: Secondary | ICD-10-CM

## 2022-05-01 DIAGNOSIS — I10 Essential (primary) hypertension: Secondary | ICD-10-CM

## 2022-05-01 DIAGNOSIS — H538 Other visual disturbances: Secondary | ICD-10-CM

## 2022-05-01 DIAGNOSIS — B182 Chronic viral hepatitis C: Secondary | ICD-10-CM

## 2022-05-01 DIAGNOSIS — R053 Chronic cough: Secondary | ICD-10-CM

## 2022-05-01 DIAGNOSIS — E1100 Type 2 diabetes mellitus with hyperosmolarity without nonketotic hyperglycemic-hyperosmolar coma (NKHHC): Secondary | ICD-10-CM

## 2022-05-01 DIAGNOSIS — R7989 Other specified abnormal findings of blood chemistry: Secondary | ICD-10-CM

## 2022-05-01 DIAGNOSIS — E119 Type 2 diabetes mellitus without complications: Secondary | ICD-10-CM

## 2022-05-01 DIAGNOSIS — Z09 Encounter for follow-up examination after completed treatment for conditions other than malignant neoplasm: Secondary | ICD-10-CM

## 2022-05-01 DIAGNOSIS — E1165 Type 2 diabetes mellitus with hyperglycemia: Secondary | ICD-10-CM

## 2022-05-01 DIAGNOSIS — K7460 Unspecified cirrhosis of liver: Secondary | ICD-10-CM

## 2022-05-01 MED ORDER — SPIRONOLACTONE 100 MG PO TABS: 100.0000 mg | ORAL_TABLET | Freq: Every day | ORAL | 2 refills | Status: DC

## 2022-05-01 MED ORDER — FOLIC ACID 1 MG PO TABS: 1.0000 mg | ORAL_TABLET | Freq: Every day | ORAL | 2 refills | Status: DC

## 2022-05-01 MED ORDER — CARVEDILOL 6.25 MG PO TABS: 6.2500 mg | ORAL_TABLET | Freq: Two times a day (BID) | ORAL | 2 refills | Status: DC

## 2022-05-01 MED ORDER — FUROSEMIDE 40 MG PO TABS: 40.0000 mg | ORAL_TABLET | Freq: Every day | ORAL | 2 refills | Status: DC

## 2022-05-01 MED ORDER — PROMETHAZINE-DM 6.25-15 MG/5ML PO SYRP: 5.0000 mL | ORAL_SOLUTION | Freq: Three times a day (TID) | ORAL | 0 refills | Status: DC | PRN

## 2022-05-01 MED ORDER — EMPAGLIFLOZIN 10 MG PO TABS: 10.0000 mg | ORAL_TABLET | Freq: Every day | ORAL | 2 refills | Status: DC

## 2022-05-01 NOTE — Progress Notes (Signed)
Pt presents for hospital f/u -experiencing blurry vision

## 2022-05-01 NOTE — Patient Instructions (Addendum)
Please call lung doctor. [address] [place], [place] [address] Phone # [phone].  Type 2 Diabetes Mellitus, Self-Care, Adult When you have type 2 diabetes (type 2 diabetes mellitus), you must make sure your blood sugar (glucose) stays in a healthy range. You can do this with: Nutrition. Exercise. Lifestyle changes. Medicines or insulin, if needed. Support from your doctors and others. What are the risks? Having type 2 diabetes can raise your risk for other long-term (chronic) health problems. You may get medicines to help prevent these problems. How to stay aware of your blood sugar  Check your blood sugar level every day, as often as told. Have your A1C (hemoglobin A1C) level checked two or more times a year. Have it checked more often if told. Your doctor will set personal treatment goals for you. In general, you should have these blood sugar levels: Before meals: 80-130 mg/dL (4.4-7.2 mmol/L). After meals: below 180 mg/dL (10 mmol/L). A1C: less than 7%. How to manage high and low blood sugar Symptoms of high blood sugar High blood sugar is also called hyperglycemia. Know the symptoms of high blood sugar. These may include: More thirst. Hunger. Feeling very tired. Needing to pee (urinate) more often than normal. Seeing things blurry. Symptoms of low blood sugar Low blood sugar is also called hypoglycemia. This is when blood sugar is at or below 70 mg/dL (3.9 mmol/L). Symptoms may include: Hunger. Feeling worried or nervous (anxious). Feeling sweaty and cold to the touch (clammy). Being dizzy or light-headed. Feeling sleepy. A fast heartbeat. Feeling grouchy (irritable). Tingling or loss of feeling (numbness) around your mouth, lips, or tongue. Restless sleep. Diabetes medicines can cause low blood sugar. You are more at risk: While you exercise. After exercise. During sleep. When you are sick. When you skip meals or do not eat for a long time. Treating  low blood sugar If you think you have low blood sugar, eat or drink something sugary right away. Keep 15 grams of a fast-acting carb (carbohydrate) with you all the time. Make sure your family and friends know how to treat you if you cannot treat yourself. Treating very low blood sugar Severe hypoglycemia is when your blood sugar is at or below 54 mg/dL (3 mmol/L). Severe hypoglycemia is an emergency. Get medical help right away. Call your local emergency services (911 in the [place] Do not wait to see if the symptoms will go away. Do not drive yourself to the hospital. You may need a glucagon shot if you have very low blood sugar and you cannot eat or drink. Have a family member or friend learn how to check your blood sugar and how to give you a glucagon shot. Ask your doctor if you should have a kit for glucagon shots. Follow these instructions at home: Medicines Take prescribed insulin or diabetes medicines as told by your health care provider. Do not run out of insulin or other medicines. Plan ahead. If you use insulin, change the amount you take based on how active you are and what foods you eat. Your doctor will tell you how to do this. Take over-the-counter and prescription medicines only as told by your doctor. Eating and drinking  Eat healthy foods. These include: Low-fat (lean) proteins. Complex carbs, such as whole grains. Fresh fruits and vegetables. Low-fat dairy products. Healthy fats. Meet with a food expert (dietitian) to make an eating plan. Follow instructions from your doctor about what you cannot eat or drink. Drink enough fluid  to keep your pee (urine) pale yellow. Keep track of carbs that you eat. Read food labels and learn serving sizes of foods. Follow your sick-day plan when you cannot eat or drink as normal. Make this plan with your doctor so it is ready to use. Activity Exercise as told by your doctor. You may need to: Do stretching and strength exercises two or  more times a week. Do 150 minutes or more of exercise each week that makes your heart beat faster and makes you sweat. Spread out your exercise over 3 or more days a week. Do not go more than 2 days in a row without exercise. Talk with your doctor before you start a new exercise. Your doctor may tell you to change: How much insulin or medicines you take. How much food you eat. Lifestyle Do not smoke or use any products that contain nicotine or tobacco. If you need help quitting, ask your doctor. If you drink alcohol and your doctor says that it is safe for you: Limit how much you have to: 0-1 drink a day for women who are not pregnant. 0-2 drinks a day for men. Know how much alcohol is in your drink. In the [place] one drink equals one 12 oz bottle of beer (355 mL), one 5 oz glass of wine (148 mL), or one 1 oz glass of hard liquor (44 mL). Learn to deal with stress. If you need help, ask your doctor. Body care  Stay up to date with your shots (immunizations). Have your eyes and feet checked by a doctor as often as told. Check your skin and feet every day. Check for cuts, bruises, redness, blisters, or sores. Brush your teeth and gums two times a day. Floss one or more times a day. Go to the dentist one or more times every 6 months. Stay at a healthy weight. General instructions Share your diabetes care plan with: Your work or school. People you live with. Carry a card or wear jewelry that says you have diabetes. Keep all follow-up visits. Questions to ask your doctor Do I need to meet with a certified expert in diabetes education and care? Where can I find a support group? Where to find more information For help and guidance and more information about diabetes, please go to: American Diabetes Association: www.diabetes.org American Association of Diabetes Care and Education Specialists: www.diabeteseducator.org International Diabetes Federation: MemberVerification.ca Summary When you have  type 2 diabetes, you must make sure your blood sugar (glucose) stays in a healthy range. You can do this with nutrition, exercise, medicines and insulin, and support from doctors and others. Check your blood sugar every day, or as often as told. Having diabetes can raise your risk for other long-term health problems. You may get medicines to help prevent these problems. Share your diabetes management plan with people at work, school, and home. Keep all follow-up visits. This information is not intended to replace advice given to you by your health care provider. Make sure you discuss any questions you have with your health care provider. Document Revised: 09/12/2020 Document Reviewed: 09/12/2020 Elsevier Patient Education  [place].

## 2022-05-02 ENCOUNTER — Encounter: Payer: Self-pay | Admitting: Internal Medicine

## 2022-05-02 ENCOUNTER — Ambulatory Visit: Payer: Self-pay | Admitting: *Deleted

## 2022-05-02 ENCOUNTER — Ambulatory Visit (INDEPENDENT_AMBULATORY_CARE_PROVIDER_SITE_OTHER): Payer: Self-pay | Admitting: Internal Medicine

## 2022-05-02 VITALS — BP 117/68 | HR 51 | Temp 97.9°F | Ht 64.0 in | Wt 162.0 lb

## 2022-05-02 DIAGNOSIS — N179 Acute kidney failure, unspecified: Secondary | ICD-10-CM

## 2022-05-02 LAB — CBC
Hematocrit: 37.1 % (ref 34.0–46.6)
Hemoglobin: 13.6 g/dL (ref 11.1–15.9)
MCH: 36.3 pg — ABNORMAL HIGH (ref 26.6–33.0)
MCHC: 36.7 g/dL — ABNORMAL HIGH (ref 31.5–35.7)
MCV: 99 fL — ABNORMAL HIGH (ref 79–97)
Platelets: 85 10*3/uL — CL (ref 150–450)
RBC: 3.75 x10E6/uL — ABNORMAL LOW (ref 3.77–5.28)
RDW: 13.3 % (ref 11.7–15.4)
WBC: 3.9 10*3/uL (ref 3.4–10.8)

## 2022-05-02 LAB — CMP14+EGFR
ALT: 149 IU/L — ABNORMAL HIGH (ref 0–32)
AST: 251 IU/L — ABNORMAL HIGH (ref 0–40)
Albumin/Globulin Ratio: 0.8 — ABNORMAL LOW (ref 1.2–2.2)
Albumin: 2.9 g/dL — ABNORMAL LOW (ref 3.8–4.9)
Alkaline Phosphatase: 106 IU/L (ref 44–121)
BUN/Creatinine Ratio: 18 (ref 9–23)
BUN: 20 mg/dL (ref 6–24)
Bilirubin Total: 2.2 mg/dL — ABNORMAL HIGH (ref 0.0–1.2)
CO2: 23 mmol/L (ref 20–29)
Calcium: 9.2 mg/dL (ref 8.7–10.2)
Chloride: 99 mmol/L (ref 96–106)
Creatinine, Ser: 1.09 mg/dL — ABNORMAL HIGH (ref 0.57–1.00)
Globulin, Total: 3.6 g/dL (ref 1.5–4.5)
Glucose: 162 mg/dL — ABNORMAL HIGH (ref 70–99)
Potassium: 4.5 mmol/L (ref 3.5–5.2)
Sodium: 138 mmol/L (ref 134–144)
Total Protein: 6.5 g/dL (ref 6.0–8.5)
eGFR: 60 mL/min/{1.73_m2} (ref >59)

## 2022-05-02 NOTE — Telephone Encounter (Signed)
  Chief Complaint: insulin RF- needs early RF Symptoms:   Frequency:   Pertinent Negatives: Patient denies   Disposition: [] ED /[] Urgent Care (no appt availability in office) / [] Appointment(In office/virtual)/ []  [facility]/ [] Home Care/ [] Refused Recommended Disposition /[]  Mobile Bus/ [x]  Follow-up with PCP Additional Notes: Patient was not sure how long her insulin pen would last- but she is going to need early RF due to taking incorrect dosing(too much) after discharge from the hospital. She is sorry she did not realize this at appointment yesterday when she saw provider.

## 2022-05-02 NOTE — Telephone Encounter (Signed)
Pt states that she is now on her second pen because she was taking 45 mL daily and was only suppose to be taking 15 mL daily until she came and saw Pcp on 10/31.Marland Kitchenshe states that this is her fault and now she knows the correct dosage.

## 2022-05-02 NOTE — Telephone Encounter (Signed)
Summary: pt needs guiadance on insulin, possible refill?   Pt states that went to see PCP yesterday and she states that no insulin pens were called in her at pharmacy, insulin glargine-yfgn (SEMGLEE) 100 UNIT/ML Pen. She said she told Amy that she since had got out of hospital had been taking 3 shots a day vers.1 shot a day that she was told to take. Notes from yesterday state she had refills already at home but pt states she started on that pen today and that is her last pen. Pt still seems confused on how long a pen should last and what she should be doing. Pls fu to advised as she states she told pcp needed more insulin, however visit notes different. FU at [phone]      Reason for Disposition  [1] Prescription refill request for ESSENTIAL medicine (i.e., likelihood of harm to patient if not taken) AND [2] triager unable to refill per department policy  Answer Assessment - Initial Assessment Questions 1. DRUG NAME: "What medicine do you need to have refilled?"     Patient is using her last pen- she will need refill- insulin glargine-yfgn (SEMGLEE) 100 UNIT/ML Pen 2. REFILLS REMAINING: "How many refills are remaining?" (Note: The label on the medicine or pill bottle will show how many refills are remaining. If there are no refills remaining, then a renewal may be needed.)     none 3. EXPIRATION DATE: "What is the expiration date?" (Note: The label states when the prescription will expire, and thus can no longer be refilled.)       4. PRESCRIBING HCP: "Who prescribed it?" Reason: If prescribed by specialist, call should be referred to that group.     PCP Patient states she was given Rx for insulin when she was discharged from the hospital. She misunderstood instructions and was injecting 3 times.day instead of once daily. She now understands correct dosing. She wants to let provider know she only has 1 pen left and she is afraid she is going to run out. She thought she had enough at appointment  yesterday- but now that she has looked at her pens- she realizes she is going to need RF. She is apologetic about the misunderstanding- but does not want to run out. Advised I will notify provider she will need early fill on insulin.  Protocols used: Medication Refill and Renewal Call-A-AH

## 2022-05-02 NOTE — Progress Notes (Unsigned)
Patient is established with different primary care and followed up with them 10/31. She would like to continue with Ms. Minette Brine as PCP. -no charge

## 2022-05-03 ENCOUNTER — Encounter: Payer: Self-pay | Admitting: Family

## 2022-05-04 ENCOUNTER — Other Ambulatory Visit: Payer: Self-pay | Admitting: Family

## 2022-05-04 ENCOUNTER — Other Ambulatory Visit: Payer: Self-pay

## 2022-05-04 DIAGNOSIS — E1165 Type 2 diabetes mellitus with hyperglycemia: Secondary | ICD-10-CM

## 2022-05-04 MED ORDER — BASAGLAR KWIKPEN 100 UNIT/ML ~~LOC~~ SOPN
15.0000 [IU] | PEN_INJECTOR | Freq: Every day | SUBCUTANEOUS | 0 refills | Status: DC
  Filled 2022-05-04: qty 6, 40d supply, fill #0
  Filled 2022-06-08: qty 6, 40d supply, fill #1

## 2022-05-04 NOTE — Telephone Encounter (Signed)
Spoke w/pt advised that Semglee has been changed to [employer] and available for pickup at [facility] at [place]. [address]

## 2022-05-04 NOTE — Telephone Encounter (Signed)
Basaglar order complete.

## 2022-05-08 ENCOUNTER — Ambulatory Visit: Payer: Self-pay

## 2022-05-18 ENCOUNTER — Other Ambulatory Visit: Payer: Self-pay

## 2022-05-22 NOTE — Progress Notes (Signed)
Patient ID: Mary Anderson, female    DOB: 1966-05-10  MRN: [id number]  CC: Diabetes Follow-Up  Subjective: Mary Anderson is a 56 y.o. female who presents for diabetes follow-up.   Her concerns today include:  Doing well on diabetes medications, no issues/concerns. She is monitoring what she eats. Home blood sugars 110's - 120's. No issues/concerns today.   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     Blood Glucose Monitoring Suppl (ACCU-CHEK GUIDE) w/Device KIT Use as directed up to four times daily 1 kit 0   carvedilol (COREG) 6.25 MG tablet Take 1 tablet (6.25 mg total) by mouth 2 (two) times daily with a meal. 60 tablet 2   empagliflozin (JARDIANCE) 10 MG TABS tablet Take 1 tablet (10 mg total) by mouth daily before breakfast. 30 tablet 2   folic acid (FOLVITE) 1 MG tablet Take 1 tablet (1 mg total) by mouth daily. 30 tablet 2   furosemide (LASIX) 40 MG tablet Take 1 tablet (40 mg total) by mouth daily. 30 tablet 2   glucose blood test strip Use as directed up to four times daily. 100 each 0   ibuprofen  (ADVIL,MOTRIN) 200 MG tablet Take 200-400 mg by mouth daily as needed (pain).     Insulin Glargine (BASAGLAR KWIKPEN) 100 UNIT/ML Inject 15 Units into the skin daily. 15 mL 0   Insulin Pen Needle 32G X 4 MM MISC 1 Needle by Does not apply route daily. 100 each 0   promethazine-dextromethorphan (PROMETHAZINE-DM) 6.25-15 MG/5ML syrup Take 5 mLs by mouth 3 (three) times daily as needed for cough. 240 mL 0   spironolactone (ALDACTONE) 100 MG tablet Take 1 tablet (100 mg total) by mouth daily. 30 tablet 2   No current facility-administered medications on file prior to visit.    No Known Allergies  Social History   Socioeconomic History   Marital status: Married    Spouse name: Not on file   Number of children: Not on file   Years of education: Not on file   Highest education level: Not on file  Occupational History   Not on file  Tobacco Use   Smoking status: Never    Passive exposure: Never   Smokeless tobacco: Never  Vaping Use   Vaping Use: Never used  Substance and Sexual Activity   Alcohol use: Not Currently    Comment: no alcohol the past 6 months.   Drug use: No   Sexual activity: Not on file  Other Topics Concern   Not on file  Social History Narrative   Not on file   Social Determinants of  Health   Financial Resource Strain: Not on file  Food Insecurity: No Food Insecurity (04/19/2022)   Hunger Vital Sign    Worried About Running Out of Food in the Last Year: Never true    Ran Out of Food in the Last Year: Never true  Transportation Needs: No Transportation Needs (04/19/2022)   PRAPARE - Hydrologist (Medical): No    Lack of Transportation (Non-Medical): No  Physical Activity: Not on file  Stress: Not on file  Social Connections: Not on file  Intimate Partner Violence: Not At Risk (04/19/2022)   Humiliation, Afraid, Rape, and Kick questionnaire    Fear of Current or Ex-Partner: No    Emotionally Abused: No    Physically Abused: No     Sexually Abused: No    Family History  Problem Relation Age of Onset   Hypertension Mother    Colon cancer Mother    Hypertension Brother    Cancer Maternal Uncle    Cancer Maternal Uncle    Clotting disorder Neg Hx    Diabetes Neg Hx     Past Surgical History:  Procedure Laterality Date   ESOPHAGOGASTRODUODENOSCOPY (EGD) WITH PROPOFOL N/A 01/30/2022   Procedure: ESOPHAGOGASTRODUODENOSCOPY (EGD) WITH PROPOFOL;  Surgeon: Lucilla Lame, MD;  Location: [facility] ENDOSCOPY;  Service: Endoscopy;  Laterality: N/A;   IR THORACENTESIS ASP PLEURAL SPACE W/IMG GUIDE  10/27/2021    ROS: Review of Systems Negative except as stated above  PHYSICAL EXAM: BP 132/83 (BP Location: Left Arm, Patient Position: Sitting, Cuff Size: Normal)   Pulse (!) 55   Temp 98.3 F (36.8 C)   Resp 16   Ht 5' 4.02" (1.626 m)   Wt 162 lb (73.5 kg)   LMP 04/22/2011   SpO2 95%   BMI 27.79 kg/m   Physical Exam HENT:     Head: Normocephalic and atraumatic.  Eyes:     Extraocular Movements: Extraocular movements intact.     Conjunctiva/sclera: Conjunctivae normal.     Pupils: Pupils are equal, round, and reactive to light.  Cardiovascular:     Rate and Rhythm: Normal rate and regular rhythm.     Pulses: Normal pulses.     Heart sounds: Normal heart sounds.  Pulmonary:     Effort: Pulmonary effort is normal.     Breath sounds: Normal breath sounds.  Musculoskeletal:     Cervical back: Normal range of motion and neck supple.  Neurological:     General: No focal deficit present.     Mental Status: She is alert and oriented to person, place, and time.  Psychiatric:        Mood and Affect: Mood normal.        Behavior: Behavior normal.   Results for orders placed or performed in visit on 05/29/22  POCT glycosylated hemoglobin (Hb A1C)  Result Value Ref Range   Hemoglobin A1C 6.9 (A) 4.0 - 5.6 %   HbA1c POC (<> result, manual entry)     HbA1c, POC (prediabetic range)     HbA1c, POC (controlled  diabetic range)      ASSESSMENT AND PLAN: 1. Type 2 diabetes mellitus with hyperglycemia, with long-term current use of insulin (HCC) - Hemoglobin A1c today at goal at 6.9%, goal 7%. This is improved from previous 10.4%. Commended her on this.  - Continue Insulin Glargine-YFGN and Empagliflozin as prescribed. No refills needed as of present.  - Discussed the importance of healthy eating habits, low-carbohydrate diet, low-sugar diet,  regular aerobic exercise (at least 150 minutes a week as tolerated) and medication compliance to achieve or maintain control of diabetes. - Follow-up with primary provider in 3 months or sooner if needed.  - POCT glycosylated hemoglobin (Hb A1C)   Patient was given the opportunity to ask questions.  Patient verbalized understanding of the plan and was able to repeat key elements of the plan. Patient was given clear instructions to go to Emergency Department or return to medical center if symptoms don't improve, worsen, or new problems develop.The patient verbalized understanding.   Orders Placed This Encounter  Procedures   POCT glycosylated hemoglobin (Hb A1C)     Return in about 3 months (around 08/29/2022) for Follow-Up or next available chronic care mgmt.  Camillia Herter, NP

## 2022-05-29 ENCOUNTER — Ambulatory Visit (INDEPENDENT_AMBULATORY_CARE_PROVIDER_SITE_OTHER): Payer: Self-pay | Admitting: Family

## 2022-05-29 VITALS — BP 132/83 | HR 55 | Temp 98.3°F | Resp 16 | Ht 64.02 in | Wt 162.0 lb

## 2022-05-29 DIAGNOSIS — E1165 Type 2 diabetes mellitus with hyperglycemia: Secondary | ICD-10-CM

## 2022-05-29 DIAGNOSIS — Z794 Long term (current) use of insulin: Secondary | ICD-10-CM

## 2022-05-29 LAB — POCT GLYCOSYLATED HEMOGLOBIN (HGB A1C): Hemoglobin A1C: 6.9 % — AB (ref 4.0–5.6)

## 2022-05-29 NOTE — Progress Notes (Signed)
.  Pt presents for follow-up for diabetes management   .A1c 10.4% 04/18/22 down to 6.9%

## 2022-06-01 ENCOUNTER — Telehealth: Payer: Self-pay | Admitting: Family

## 2022-06-01 NOTE — Telephone Encounter (Signed)
Patient called in states, metformin medication is too expensive at 600.00. Please call back to discuss

## 2022-06-07 ENCOUNTER — Ambulatory Visit (INDEPENDENT_AMBULATORY_CARE_PROVIDER_SITE_OTHER): Payer: Self-pay | Admitting: Gastroenterology

## 2022-06-07 DIAGNOSIS — Z23 Encounter for immunization: Secondary | ICD-10-CM

## 2022-06-07 NOTE — Progress Notes (Signed)
Dr Servando Snare out of the office, send to Dr Tobi Bastos, injection 3 of 3 Hep A/B given in Right Deltoid by Roena Malady.  Patient tolerated injection well.

## 2022-06-11 ENCOUNTER — Other Ambulatory Visit: Payer: Self-pay

## 2022-06-15 NOTE — Progress Notes (Unsigned)
Patient ID: Mary Anderson, female    DOB: Jan 27, 1966  MRN: [id number]  CC: Chronic Care Management  Subjective: Mary Anderson is a 56 y.o. female who presents for chronic care management. She is accompanied by her husband.   Her concerns today include:  Doing well on chronic care medications, no issues/concerns. Denies red flag symptoms.   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     Accu-Chek Softclix Lancets lancets Use as directed up to four times daily 200 each 0   acetaminophen (TYLENOL) 500 MG tablet Take 500-1,000 mg by mouth every 8 (eight) hours as needed for mild pain.     Blood Glucose Monitoring Suppl (ACCU-CHEK GUIDE) w/Device KIT Use as directed up to four times daily 1 kit 0   folic acid (FOLVITE) 1 MG tablet Take 1 tablet (1 mg total) by mouth daily. 30 tablet 2   glucose blood test strip Use as directed up to four times daily. 100 each 0   ibuprofen (ADVIL,MOTRIN) 200 MG tablet Take 200-400 mg by mouth daily as needed (pain).     promethazine-dextromethorphan (PROMETHAZINE-DM) 6.25-15 MG/5ML syrup Take 5 mLs by mouth 3 (three) times daily as needed for cough. 240 mL 0   No current facility-administered  medications on file prior to visit.    No Known Allergies  Social History   Socioeconomic History   Marital status: Married    Spouse name: Not on file   Number of children: Not on file   Years of education: Not on file   Highest education level: Not on file  Occupational History   Not on file  Tobacco Use   Smoking status: Never    Passive exposure: Never   Smokeless tobacco: Never  Vaping Use   Vaping Use: Never used  Substance and Sexual Activity   Alcohol use: Not Currently    Comment: no alcohol the past 6 months.   Drug use: No   Sexual activity: Not on file  Other Topics Concern   Not on file  Social History Narrative   Not on file   Social Determinants of Health   Financial Resource Strain: Not on file  Food Insecurity: No Food Insecurity (04/19/2022)   Hunger Vital Sign    Worried About Running Out of Food in the Last Year: Never true    Ran Out of Food in the Last Year: Never true  Transportation Needs: No Transportation Needs (04/19/2022)   PRAPARE - Hydrologist (Medical): No    Lack of Transportation (Non-Medical): No  Physical Activity: Not on file  Stress: Not on file  Social Connections: Not on file  Intimate Partner Violence: Not At Risk (04/19/2022)   Humiliation, Afraid, Rape, and  Kick questionnaire    Fear of Current or Ex-Partner: No    Emotionally Abused: No    Physically Abused: No    Sexually Abused: No    Family History  Problem Relation Age of Onset   Hypertension Mother    Colon cancer Mother    Hypertension Brother    Cancer Maternal Uncle    Cancer Maternal Uncle    Clotting disorder Neg Hx    Diabetes Neg Hx     Past Surgical History:  Procedure Laterality Date   ESOPHAGOGASTRODUODENOSCOPY (EGD) WITH PROPOFOL N/A 01/30/2022   Procedure: ESOPHAGOGASTRODUODENOSCOPY (EGD) WITH PROPOFOL;  Surgeon: Lucilla Lame, MD;  Location: [facility] ENDOSCOPY;  Service: Endoscopy;  Laterality: N/A;   IR THORACENTESIS  ASP PLEURAL SPACE W/IMG GUIDE  10/27/2021    ROS: Review of Systems Negative except as stated above  PHYSICAL EXAM: BP 130/81 (BP Location: Left Arm, Patient Position: Sitting, Cuff Size: Normal)   Pulse (!) 51   Temp 98.3 F (36.8 C)   Resp 16   Ht 5' 4.02" (1.626 m)   Wt 160 lb (72.6 kg)   LMP 04/22/2011   SpO2 97%   BMI 27.45 kg/m   Physical Exam HENT:     Head: Normocephalic and atraumatic.  Eyes:     Extraocular Movements: Extraocular movements intact.     Conjunctiva/sclera: Conjunctivae normal.     Pupils: Pupils are equal, round, and reactive to light.  Cardiovascular:     Rate and Rhythm: Bradycardia present.     Pulses: Normal pulses.     Heart sounds: Normal heart sounds.  Pulmonary:     Effort: Pulmonary effort is normal.     Breath sounds: Normal breath sounds.  Musculoskeletal:     Cervical back: Normal range of motion and neck supple.  Neurological:     General: No focal deficit present.     Mental Status: She is alert and oriented to person, place, and time.  Psychiatric:        Mood and Affect: Mood normal.        Behavior: Behavior normal.     ASSESSMENT AND PLAN: 1. Primary hypertension - Continue Carvedilol, Furosemide, and Spironolactone as prescribed.  - Counseled on blood pressure goal of less than 130/80, low-sodium, DASH diet, medication compliance, and 150 minutes of moderate intensity exercise per week as tolerated. Counseled on medication adherence and adverse effects. - Follow-up with primary provider in 3 months or sooner if needed.  - carvedilol (COREG) 6.25 MG tablet; Take 1 tablet (6.25 mg total) by mouth 2 (two) times daily with a meal.  Dispense: 60 tablet; Refill: 2 - furosemide (LASIX) 40 MG tablet; Take 1 tablet (40 mg total) by mouth daily.  Dispense: 30 tablet; Refill: 2 - spironolactone (ALDACTONE) 100 MG tablet; Take 1 tablet (100 mg total) by mouth daily.  Dispense: 30 tablet; Refill: 2  2. Type 2 diabetes mellitus with  hyperglycemia, with long-term current use of insulin (HCC) - Hemoglobin A1C 6.9% on 05/29/2022. Will recheck hemoglobin A1c at next visit.  - Continue Insulin Glargine-YFGN and Empagliflozin as prescribed.  - Discussed the importance of healthy eating habits, low-carbohydrate diet, low-sugar diet, regular aerobic exercise (at least 150 minutes a week as tolerated) and medication compliance to achieve or maintain control of diabetes. - Follow-up with primary provider in 3 months or sooner if needed. - Insulin Glargine (BASAGLAR KWIKPEN) 100 UNIT/ML; Inject 15 Units into the skin daily.  Dispense: 15 mL; Refill: 0 - empagliflozin (  JARDIANCE) 10 MG TABS tablet; Take 1 tablet (10 mg total) by mouth daily before breakfast.  Dispense: 30 tablet; Refill: 2 - Insulin Pen Needle 32G X 4 MM MISC; use once daily  Dispense: 100 each; Refill: 0   Patient was given the opportunity to ask questions.  Patient verbalized understanding of the plan and was able to repeat key elements of the plan. Patient was given clear instructions to go to Emergency Department or return to medical center if symptoms don't improve, worsen, or new problems develop.The patient verbalized understanding.   Requested Prescriptions   Signed Prescriptions Disp Refills   carvedilol (COREG) 6.25 MG tablet 60 tablet 2    Sig: Take 1 tablet (6.25 mg total) by mouth 2 (two) times daily with a meal.   furosemide (LASIX) 40 MG tablet 30 tablet 2    Sig: Take 1 tablet (40 mg total) by mouth daily.   Insulin Glargine (BASAGLAR KWIKPEN) 100 UNIT/ML 15 mL 0    Sig: Inject 15 Units into the skin daily.   spironolactone (ALDACTONE) 100 MG tablet 30 tablet 2    Sig: Take 1 tablet (100 mg total) by mouth daily.   empagliflozin (JARDIANCE) 10 MG TABS tablet 30 tablet 2    Sig: Take 1 tablet (10 mg total) by mouth daily before breakfast.   Insulin Pen Needle 32G X 4 MM MISC 100 each 0    Sig: use once daily    Follow-up with primary provider as  scheduled.   Camillia Herter, NP

## 2022-06-19 ENCOUNTER — Ambulatory Visit (INDEPENDENT_AMBULATORY_CARE_PROVIDER_SITE_OTHER): Payer: Self-pay | Admitting: Family

## 2022-06-19 ENCOUNTER — Encounter: Payer: Self-pay | Admitting: Family

## 2022-06-19 VITALS — BP 130/81 | HR 51 | Temp 98.3°F | Resp 16 | Ht 64.02 in | Wt 160.0 lb

## 2022-06-19 DIAGNOSIS — I10 Essential (primary) hypertension: Secondary | ICD-10-CM

## 2022-06-19 DIAGNOSIS — E1165 Type 2 diabetes mellitus with hyperglycemia: Secondary | ICD-10-CM

## 2022-06-19 DIAGNOSIS — Z794 Long term (current) use of insulin: Secondary | ICD-10-CM

## 2022-06-19 MED ORDER — FUROSEMIDE 40 MG PO TABS: 40.0000 mg | ORAL_TABLET | Freq: Every day | ORAL | 2 refills | Status: DC

## 2022-06-19 MED ORDER — SPIRONOLACTONE 100 MG PO TABS: 100.0000 mg | ORAL_TABLET | Freq: Every day | ORAL | 2 refills | Status: DC

## 2022-06-19 MED ORDER — CARVEDILOL 6.25 MG PO TABS: 6.2500 mg | ORAL_TABLET | Freq: Two times a day (BID) | ORAL | 2 refills | Status: DC

## 2022-06-19 MED ORDER — BASAGLAR KWIKPEN 100 UNIT/ML ~~LOC~~ SOPN: 15.0000 [IU] | PEN_INJECTOR | Freq: Every day | SUBCUTANEOUS | 0 refills | Status: DC

## 2022-06-19 MED ORDER — EMPAGLIFLOZIN 10 MG PO TABS: 10.0000 mg | ORAL_TABLET | Freq: Every day | ORAL | 2 refills | Status: AC

## 2022-06-19 MED ORDER — INSULIN PEN NEEDLE 32G X 4 MM MISC: 1.0000 | Freq: Every day | 0 refills | Status: DC

## 2022-06-19 NOTE — Progress Notes (Signed)
.  Pt presents for chronic care management   

## 2022-06-24 ENCOUNTER — Other Ambulatory Visit: Payer: Self-pay | Admitting: Family

## 2022-06-24 DIAGNOSIS — I10 Essential (primary) hypertension: Secondary | ICD-10-CM

## 2022-07-19 ENCOUNTER — Other Ambulatory Visit: Payer: Self-pay | Admitting: Family

## 2022-07-19 ENCOUNTER — Other Ambulatory Visit: Payer: Self-pay

## 2022-07-19 DIAGNOSIS — E1165 Type 2 diabetes mellitus with hyperglycemia: Secondary | ICD-10-CM

## 2022-07-19 MED ORDER — BASAGLAR KWIKPEN 100 UNIT/ML ~~LOC~~ SOPN
15.0000 [IU] | PEN_INJECTOR | Freq: Every day | SUBCUTANEOUS | 0 refills | Status: DC
  Filled 2022-07-19: qty 6, 40d supply, fill #0
  Filled 2022-08-27 (×2): qty 6, 40d supply, fill #1
  Filled 2022-10-12: qty 3, 20d supply, fill #2

## 2022-07-19 NOTE — Telephone Encounter (Signed)
Copied from [place] [id number]. Topic: General - Other >> Jul 19, 2022  9:19 AM Everette C wrote: Reason for CRM: Medication Refill - Medication: Insulin Glargine (BASAGLAR KWIKPEN) 100 UNIT/ML [[id number]]  Has the patient contacted their pharmacy? Yes.   (Agent: If no, request that the patient contact the pharmacy for the refill. If patient does not wish to contact the pharmacy document the reason why and proceed with request.) (Agent: If yes, when and what did the pharmacy advise?)  Preferred Pharmacy (with phone number or street name): [facility] [address] [place] [address] Phone: [phone] Fax: [phone] Hours: M-F 7:30a-6:00p   Has the patient been seen for an appointment in the last year OR does the patient have an upcoming appointment? Yes.    Agent: Please be advised that RX refills may take up to 3 business days. We ask that you follow-up with your pharmacy.

## 2022-07-20 ENCOUNTER — Other Ambulatory Visit: Payer: Self-pay

## 2022-07-24 ENCOUNTER — Other Ambulatory Visit: Payer: Self-pay | Admitting: Family

## 2022-07-24 DIAGNOSIS — D539 Nutritional anemia, unspecified: Secondary | ICD-10-CM

## 2022-07-24 NOTE — Telephone Encounter (Signed)
Requested Prescriptions  Pending Prescriptions Disp Refills   folic acid (FOLVITE) 1 MG tablet [Pharmacy Med Name: FOLIC ACID 1 MG TABLET] 90 tablet 0    Sig: TAKE 1 TABLET BY MOUTH EVERY DAY     Endocrinology:  Vitamins Passed - 07/24/2022  1:15 AM      Passed - Valid encounter within last 12 months    Recent Outpatient Visits           1 month ago Primary hypertension   [facility], Amy J, NP   1 month ago Type 2 diabetes mellitus with hyperglycemia, with long-term current use of insulin [facility])   [facility], [place], NP   2 months ago Hospital discharge follow-up   [facility], [place], NP   4 months ago Chronic cough   [facility], Amy J, NP   8 months ago Encounter to establish care   [facility], Flonnie Hailstone, NP       Future Appointments             In 1 month Camillia Herter, NP [place] [facility]

## 2022-07-25 ENCOUNTER — Other Ambulatory Visit: Payer: Self-pay | Admitting: Student

## 2022-07-30 ENCOUNTER — Other Ambulatory Visit: Payer: Self-pay

## 2022-08-17 ENCOUNTER — Other Ambulatory Visit: Payer: Self-pay | Admitting: Family

## 2022-08-17 ENCOUNTER — Other Ambulatory Visit: Payer: Self-pay

## 2022-08-17 MED ORDER — ACCU-CHEK GUIDE VI STRP
ORAL_STRIP | 0 refills | Status: DC
  Filled 2022-08-17: qty 100, 25d supply, fill #0

## 2022-08-22 NOTE — Progress Notes (Signed)
Patient ID: [name], female    DOB: May 07, 1966  MRN: [id number]  CC: Chronic Care Management  Subjective: Mary Anderson is a 57 y.o. female who presents for chronic care management. She is accompanied by her husband.  Her concerns today include:  - Doing well on blood pressure medications, no issues/concerns. She denies red flag symptoms. She is not checking blood pressures outside of office. - Doing well on insulin for diabetes management, no issues/concerns. She is not taking Jardiance. Home blood sugars 110's - 120's. She denies red flag symptoms.   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     Blood Glucose Monitoring Suppl (ACCU-CHEK GUIDE) w/Device KIT Use as directed up to four times daily 1 kit 0   empagliflozin (JARDIANCE) 10 MG TABS tablet Take 1 tablet (10 mg total) by mouth daily before breakfast. 30 tablet 2   folic acid (FOLVITE) 1 MG tablet TAKE 1 TABLET BY MOUTH EVERY DAY 90 tablet 0   glucose blood (ACCU-CHEK GUIDE) test strip Use as directed up to four times daily. 100 each 0   ibuprofen  (ADVIL,MOTRIN) 200 MG tablet Take 200-400 mg by mouth daily as needed (pain).     Insulin Glargine (BASAGLAR KWIKPEN) 100 UNIT/ML Inject 15 Units into the skin daily. 15 mL 0   Insulin Pen Needle 32G X 4 MM MISC use once daily 100 each 0   promethazine-dextromethorphan (PROMETHAZINE-DM) 6.25-15 MG/5ML syrup Take 5 mLs by mouth 3 (three) times daily as needed for cough. 240 mL 0   No current facility-administered medications on file prior to visit.    No Known Allergies  Social History   Socioeconomic History   Marital status: Married    Spouse name: Not on file   Number of children: Not on file   Years of education: Not on file   Highest education level: Not on file  Occupational History   Not on file  Tobacco Use   Smoking status: Never    Passive exposure: Never   Smokeless tobacco: Never  Vaping Use   Vaping Use: Never used  Substance and Sexual Activity   Alcohol use: Not Currently    Comment: no alcohol the past 6 months.   Drug use: No   Sexual activity: Not on file  Other Topics Concern   Not on file  Social History Narrative   Not on file   Social Determinants of Health   Financial Resource Strain: Not on file  Food Insecurity: No Food Insecurity (04/19/2022)   Hunger Vital Sign    Worried About Running Out of Food in the Last Year:  Never true    Ran Out of Food in the Last Year: Never true  Transportation Needs: No Transportation Needs (04/19/2022)   PRAPARE - Hydrologist (Medical): No    Lack of Transportation (Non-Medical): No  Physical Activity: Not on file  Stress: Not on file  Social Connections: Not on file  Intimate Partner Violence: Not At Risk (04/19/2022)   Humiliation, Afraid, Rape, and Kick questionnaire    Fear of Current or Ex-Partner: No    Emotionally Abused: No    Physically Abused: No    Sexually Abused: No    Family History  Problem Relation Age of Onset   Hypertension Mother    Colon cancer Mother     Hypertension Brother    Cancer Maternal Uncle    Cancer Maternal Uncle    Clotting disorder Neg Hx    Diabetes Neg Hx     Past Surgical History:  Procedure Laterality Date   ESOPHAGOGASTRODUODENOSCOPY (EGD) WITH PROPOFOL N/A 01/30/2022   Procedure: ESOPHAGOGASTRODUODENOSCOPY (EGD) WITH PROPOFOL;  Surgeon: Lucilla Lame, MD;  Location: [facility] ENDOSCOPY;  Service: Endoscopy;  Laterality: N/A;   IR THORACENTESIS ASP PLEURAL SPACE W/IMG GUIDE  10/27/2021    ROS: Review of Systems Negative except as stated above  PHYSICAL EXAM: BP 132/83 (BP Location: Left Arm, Patient Position: Sitting, Cuff Size: Large)   Pulse (!) 52   Temp 98.3 F (36.8 C)   Resp 16   Ht 5' 4.02" (1.626 m)   Wt 160 lb (72.6 kg)   LMP 04/22/2011   SpO2 97%   BMI 27.45 kg/m   Physical Exam HENT:     Head: Normocephalic and atraumatic.  Eyes:     Extraocular Movements: Extraocular movements intact.     Conjunctiva/sclera: Conjunctivae normal.     Pupils: Pupils are equal, round, and reactive to light.  Cardiovascular:     Rate and Rhythm: Bradycardia present.     Pulses: Normal pulses.     Heart sounds: Normal heart sounds.  Pulmonary:     Effort: Pulmonary effort is normal.     Breath sounds: Normal breath sounds.  Musculoskeletal:     Cervical back: Normal range of motion and neck supple.  Neurological:     General: No focal deficit present.     Mental Status: She is alert and oriented to person, place, and time.  Psychiatric:        Mood and Affect: Mood normal.        Behavior: Behavior normal.    Results for orders placed or performed in visit on 08/29/22  POCT glycosylated hemoglobin (Hb A1C)  Result Value Ref Range   Hemoglobin A1C     HbA1c POC (<> result, manual entry)     HbA1c, POC (prediabetic range) 6.2 5.7 - 6.4 %   HbA1c, POC (controlled diabetic range)       ASSESSMENT AND PLAN: 1. Primary hypertension - Continue Hydralazine, Spironolactone, Carvedilol, and Furosemide as  prescribed.  - Routine screening.  - Counseled on blood pressure goal of less than 130/80, low-sodium, DASH diet, medication compliance, and 150 minutes of moderate intensity exercise per week as tolerated. Counseled on medication adherence and adverse effects. - Follow-up with primary provider in 3 months or sooner if needed.  - Basic Metabolic Panel - furosemide (LASIX) 40 MG tablet; Take 1 tablet (40 mg total) by mouth daily.  Dispense: 30 tablet; Refill: 2 - carvedilol (COREG) 6.25 MG tablet; Take 1 tablet (6.25  mg total) by mouth 2 (two) times daily with a meal.  Dispense: 60 tablet; Refill: 2 - spironolactone (ALDACTONE) 100 MG tablet; Take 1 tablet (100 mg total) by mouth daily.  Dispense: 30 tablet; Refill: 2 - hydrALAZINE (APRESOLINE) 25 MG tablet; Take 1 tablet (25 mg total) by mouth 2 (two) times daily.  Dispense: 60 tablet; Refill: 2  2. Type 2 diabetes mellitus with hyperglycemia, with long-term current use of insulin (HCC) - Hemoglobin A1c at goal at 6.2%, goal 7%. This is improved from previous 6.9%.  - Continue Insulin Glargine as prescribed. No refills needed as of present.  - Discussed the importance of healthy eating habits, low-carbohydrate diet, low-sugar diet, regular aerobic exercise (at least 150 minutes a week as tolerated) and medication compliance to achieve or maintain control of diabetes. - Follow-up with primary provider in 3 months or sooner if needed.  - POCT glycosylated hemoglobin (Hb A1C)   Patient was given the opportunity to ask questions.  Patient verbalized understanding of the plan and was able to repeat key elements of the plan. Patient was given clear instructions to go to Emergency Department or return to medical center if symptoms don't improve, worsen, or new problems develop.The patient verbalized understanding.   Orders Placed This Encounter  Procedures   Basic Metabolic Panel   POCT glycosylated hemoglobin (Hb A1C)     Requested  Prescriptions   Signed Prescriptions Disp Refills   furosemide (LASIX) 40 MG tablet 30 tablet 2    Sig: Take 1 tablet (40 mg total) by mouth daily.   carvedilol (COREG) 6.25 MG tablet 60 tablet 2    Sig: Take 1 tablet (6.25 mg total) by mouth 2 (two) times daily with a meal.   spironolactone (ALDACTONE) 100 MG tablet 30 tablet 2    Sig: Take 1 tablet (100 mg total) by mouth daily.   hydrALAZINE (APRESOLINE) 25 MG tablet 60 tablet 2    Sig: Take 1 tablet (25 mg total) by mouth 2 (two) times daily.    Return in about 3 months (around 11/27/2022) for Follow-Up or next available chronic care mgmt .  Camillia Herter, NP

## 2022-08-27 ENCOUNTER — Other Ambulatory Visit: Payer: Self-pay

## 2022-08-29 ENCOUNTER — Ambulatory Visit (INDEPENDENT_AMBULATORY_CARE_PROVIDER_SITE_OTHER): Payer: Self-pay | Admitting: Family

## 2022-08-29 ENCOUNTER — Other Ambulatory Visit: Payer: Self-pay

## 2022-08-29 ENCOUNTER — Encounter: Payer: Self-pay | Admitting: Family

## 2022-08-29 VITALS — BP 132/83 | HR 52 | Temp 98.3°F | Resp 16 | Ht 64.02 in | Wt 160.0 lb

## 2022-08-29 DIAGNOSIS — I10 Essential (primary) hypertension: Secondary | ICD-10-CM

## 2022-08-29 DIAGNOSIS — E1165 Type 2 diabetes mellitus with hyperglycemia: Secondary | ICD-10-CM

## 2022-08-29 DIAGNOSIS — Z794 Long term (current) use of insulin: Secondary | ICD-10-CM

## 2022-08-29 LAB — POCT GLYCOSYLATED HEMOGLOBIN (HGB A1C): HbA1c, POC (prediabetic range): 6.2 % (ref 5.7–6.4)

## 2022-08-29 MED ORDER — FUROSEMIDE 40 MG PO TABS
40.0000 mg | ORAL_TABLET | Freq: Every day | ORAL | 2 refills | Status: DC
  Filled 2022-08-29: qty 30, 30d supply, fill #0
  Filled 2022-09-28 (×2): qty 30, 30d supply, fill #1
  Filled 2022-10-26: qty 30, 30d supply, fill #2

## 2022-08-29 MED ORDER — CARVEDILOL 6.25 MG PO TABS
6.2500 mg | ORAL_TABLET | Freq: Two times a day (BID) | ORAL | 2 refills | Status: DC
  Filled 2022-08-29: qty 60, 30d supply, fill #0
  Filled 2022-09-28 (×2): qty 60, 30d supply, fill #1
  Filled 2022-10-26: qty 60, 30d supply, fill #2

## 2022-08-29 MED ORDER — HYDRALAZINE HCL 25 MG PO TABS
25.0000 mg | ORAL_TABLET | Freq: Two times a day (BID) | ORAL | 2 refills | Status: DC
  Filled 2022-08-29: qty 60, 30d supply, fill #0
  Filled 2022-09-28 (×2): qty 60, 30d supply, fill #1
  Filled 2022-10-26: qty 60, 30d supply, fill #2

## 2022-08-29 MED ORDER — SPIRONOLACTONE 100 MG PO TABS
100.0000 mg | ORAL_TABLET | Freq: Every day | ORAL | 2 refills | Status: DC
  Filled 2022-08-29: qty 30, 30d supply, fill #0
  Filled 2022-09-28: qty 30, 30d supply, fill #1
  Filled 2022-09-28: qty 60, 60d supply, fill #1

## 2022-08-29 NOTE — Progress Notes (Signed)
.  Pt presents for chronic care management   

## 2022-08-30 LAB — BASIC METABOLIC PANEL
BUN/Creatinine Ratio: 18 (ref 9–23)
BUN: 16 mg/dL (ref 6–24)
CO2: 24 mmol/L (ref 20–29)
Calcium: 8.5 mg/dL — ABNORMAL LOW (ref 8.7–10.2)
Chloride: 105 mmol/L (ref 96–106)
Creatinine, Ser: 0.88 mg/dL (ref 0.57–1.00)
Glucose: 194 mg/dL — ABNORMAL HIGH (ref 70–99)
Potassium: 4 mmol/L (ref 3.5–5.2)
Sodium: 142 mmol/L (ref 134–144)
eGFR: 77 mL/min/{1.73_m2} (ref >59)

## 2022-09-03 ENCOUNTER — Other Ambulatory Visit: Payer: Self-pay

## 2022-09-04 ENCOUNTER — Other Ambulatory Visit: Payer: Self-pay

## 2022-09-28 ENCOUNTER — Other Ambulatory Visit: Payer: Self-pay

## 2022-10-01 ENCOUNTER — Other Ambulatory Visit: Payer: Self-pay

## 2022-10-05 ENCOUNTER — Ambulatory Visit
Admission: RE | Admit: 2022-10-05 | Discharge: 2022-10-05 | Disposition: A | Discharge status: Home / Self Care | Payer: Self-pay | Source: Ambulatory Visit | Attending: Oncology | Admitting: Oncology

## 2022-10-05 ENCOUNTER — Other Ambulatory Visit: Payer: Self-pay

## 2022-10-05 DIAGNOSIS — K769 Liver disease, unspecified: Secondary | ICD-10-CM

## 2022-10-05 MED ORDER — GADOBUTROL 1 MMOL/ML IV SOLN
7.5000 mL | Freq: Once | INTRAVENOUS | Status: AC | PRN
  Administered 2022-10-05: 7.5 mL via INTRAVENOUS

## 2022-10-08 ENCOUNTER — Inpatient Hospital Stay (HOSPITAL_BASED_OUTPATIENT_CLINIC_OR_DEPARTMENT_OTHER): Payer: Self-pay | Admitting: Oncology

## 2022-10-08 ENCOUNTER — Inpatient Hospital Stay: Payer: Self-pay | Attending: Oncology

## 2022-10-08 ENCOUNTER — Encounter: Payer: Self-pay | Admitting: Oncology

## 2022-10-08 VITALS — BP 136/75 | HR 100 | Temp 96.8°F | Resp 16 | Wt 166.0 lb

## 2022-10-08 DIAGNOSIS — K769 Liver disease, unspecified: Secondary | ICD-10-CM

## 2022-10-08 DIAGNOSIS — K7469 Other cirrhosis of liver: Secondary | ICD-10-CM

## 2022-10-08 DIAGNOSIS — K7460 Unspecified cirrhosis of liver: Secondary | ICD-10-CM | POA: Insufficient documentation

## 2022-10-08 LAB — CMP (CANCER CENTER ONLY)
ALT: 140 U/L — ABNORMAL HIGH (ref 0–44)
AST: 197 U/L (ref 15–41)
Albumin: 3 g/dL — ABNORMAL LOW (ref 3.5–5.0)
Alkaline Phosphatase: 79 U/L (ref 38–126)
Anion gap: 5 (ref 5–15)
BUN: 19 mg/dL (ref 6–20)
CO2: 23 mmol/L (ref 22–32)
Calcium: 8.5 mg/dL — ABNORMAL LOW (ref 8.9–10.3)
Chloride: 106 mmol/L (ref 98–111)
Creatinine: 0.93 mg/dL (ref 0.44–1.00)
GFR, Estimated: >60 mL/min (ref >60)
Glucose, Bld: 235 mg/dL — ABNORMAL HIGH (ref 70–99)
Potassium: 4.1 mmol/L (ref 3.5–5.1)
Sodium: 134 mmol/L — ABNORMAL LOW (ref 135–145)
Total Bilirubin: 1.5 mg/dL — ABNORMAL HIGH (ref 0.3–1.2)
Total Protein: 6.5 g/dL (ref 6.5–8.1)

## 2022-10-08 LAB — CBC WITH DIFFERENTIAL (CANCER CENTER ONLY)
Abs Immature Granulocytes: 0 10*3/uL (ref 0.00–0.07)
Basophils Absolute: 0 10*3/uL (ref 0.0–0.1)
Basophils Relative: 0 %
Eosinophils Absolute: 0.1 10*3/uL (ref 0.0–0.5)
Eosinophils Relative: 2 %
HCT: 35.1 % — ABNORMAL LOW (ref 36.0–46.0)
Hemoglobin: 12.5 g/dL (ref 12.0–15.0)
Immature Granulocytes: 0 %
Lymphocytes Relative: 25 %
Lymphs Abs: 0.6 10*3/uL — ABNORMAL LOW (ref 0.7–4.0)
MCH: 35.6 pg — ABNORMAL HIGH (ref 26.0–34.0)
MCHC: 35.6 g/dL (ref 30.0–36.0)
MCV: 100 fL (ref 80.0–100.0)
Monocytes Absolute: 0.3 10*3/uL (ref 0.1–1.0)
Monocytes Relative: 13 %
Neutro Abs: 1.4 10*3/uL — ABNORMAL LOW (ref 1.7–7.7)
Neutrophils Relative %: 60 %
Platelet Count: 56 10*3/uL — ABNORMAL LOW (ref 150–400)
RBC: 3.51 MIL/uL — ABNORMAL LOW (ref 3.87–5.11)
RDW: 13.6 % (ref 11.5–15.5)
WBC Count: 2.3 10*3/uL — ABNORMAL LOW (ref 4.0–10.5)
nRBC: 0 % (ref 0.0–0.2)

## 2022-10-08 NOTE — Progress Notes (Signed)
Pt in for follow up, reports she had MRI on Friday.  Pt denies any concerns today.

## 2022-10-08 NOTE — Progress Notes (Signed)
Hematology/Oncology Consult note [facility]  [phone] [phone]  Patient Care Team: Rema FendtStephens, Amy J, NP as PCP - General (Nurse Practitioner) Creig Hinesao, Selim Durden C, MD as Consulting Physician (Oncology)   Name of the patient: Mary Anderson  [id number]  11/26/1965   Date of visit: 10/08/22  Diagnosis-history of liver cirrhosis and concern for liver lesion  Chief complaint/ Reason for visit-discuss MRI results and further management  Heme/Onc history: Patient is a 57 year old female with a past medical history significant for 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 She has been getting HCC surveillance by Dr. Servando SnareWohl. She had an MRI liver with and without contrast in September 2023 and was found to have a 16mm focus of subcapsular enhancement in the posterior right liver with no definitive characteristics of washout. This may reflect transient hepatic intensity difference potentially related to underlying vascular malformation. However her AFP was found to be elevated at 18 and therefore patient has been referred for further management.   Interval history-patient has not had any recent hospitalizations for liver disease/cirrhosis.  Reports chronic fatigue.  ECOG PS- 2 Pain scale- 0   Review of systems- Review of Systems  Constitutional:  Positive for malaise/fatigue. Negative for chills, fever and weight loss.  HENT:  Negative for congestion, ear discharge and nosebleeds.   Eyes:  Negative for blurred vision.  Respiratory:  Negative for cough, hemoptysis, sputum production, shortness of breath and wheezing.   Cardiovascular:  Negative for chest pain, palpitations, orthopnea and claudication.  Gastrointestinal:  Negative for abdominal pain, blood in stool, constipation, diarrhea, heartburn, melena, nausea and vomiting.  Genitourinary:  Negative for dysuria, flank pain, frequency, hematuria and urgency.  Musculoskeletal:   Negative for back pain, joint pain and myalgias.  Skin:  Negative for rash.  Neurological:  Negative for dizziness, tingling, focal weakness, seizures, weakness and headaches.  Endo/Heme/Allergies:  Does not bruise/bleed easily.  Psychiatric/Behavioral:  Negative for depression and suicidal ideas. The patient does not have insomnia.       No Known Allergies   Past Medical History:  Diagnosis Date   Hypertension      Past Surgical History:  Procedure Laterality Date   ESOPHAGOGASTRODUODENOSCOPY (EGD) WITH PROPOFOL N/A 01/30/2022   Procedure: ESOPHAGOGASTRODUODENOSCOPY (EGD) WITH PROPOFOL;  Surgeon: Midge MiniumWohl, Darren, MD;  Location: [facility] ENDOSCOPY;  Service: Endoscopy;  Laterality: N/A;   IR THORACENTESIS ASP PLEURAL SPACE W/IMG GUIDE  10/27/2021    Social History   Socioeconomic History   Marital status: Married    Spouse name: Not on file   Number of children: Not on file   Years of education: Not on file   Highest education level: Not on file  Occupational History   Not on file  Tobacco Use   Smoking status: Never    Passive exposure: Never   Smokeless tobacco: Never  Vaping Use   Vaping Use: Never used  Substance and Sexual Activity   Alcohol use: Not Currently    Comment: no alcohol the past 6 months.   Drug use: No   Sexual activity: Not on file  Other Topics Concern   Not on file  Social History Narrative   Not on file   Social Determinants of Health   Financial Resource Strain: Not on file  Food Insecurity: No Food Insecurity (04/19/2022)   Hunger Vital Sign    Worried About Running Out of Food in the Last Year: Never true    Ran Out of Food in the  Last Year: Never true  Transportation Needs: No Transportation Needs (04/19/2022)   PRAPARE - Administrator, Civil Service (Medical): No    Lack of Transportation (Non-Medical): No  Physical Activity: Not on file  Stress: Not on file  Social Connections: Not on file  Intimate Partner Violence: Not  At Risk (04/19/2022)   Humiliation, Afraid, Rape, and Kick questionnaire    Fear of Current or Ex-Partner: No    Emotionally Abused: No    Physically Abused: No    Sexually Abused: No    Family History  Problem Relation Age of Onset   Hypertension Mother    Colon cancer Mother    Hypertension Brother    Cancer Maternal Uncle    Cancer Maternal Uncle    Clotting disorder Neg Hx    Diabetes Neg Hx      Current Outpatient Medications:    Accu-Chek Softclix Lancets lancets, Use as directed up to four times daily, Disp: 200 each, Rfl: 0   acetaminophen (TYLENOL) 500 MG tablet, Take 500-1,000 mg by mouth every 8 (eight) hours as needed for mild pain., Disp: , Rfl:    Blood Glucose Monitoring Suppl (ACCU-CHEK GUIDE) w/Device KIT, Use as directed up to four times daily, Disp: 1 kit, Rfl: 0   carvedilol (COREG) 6.25 MG tablet, Take 1 tablet (6.25 mg total) by mouth 2 (two) times daily with a meal., Disp: 60 tablet, Rfl: 2   folic acid (FOLVITE) 1 MG tablet, TAKE 1 TABLET BY MOUTH EVERY DAY, Disp: 90 tablet, Rfl: 0   furosemide (LASIX) 40 MG tablet, Take 1 tablet (40 mg total) by mouth daily., Disp: 30 tablet, Rfl: 2   glucose blood (ACCU-CHEK GUIDE) test strip, Use as directed up to four times daily., Disp: 100 each, Rfl: 0   hydrALAZINE (APRESOLINE) 25 MG tablet, Take 1 tablet (25 mg total) by mouth 2 (two) times daily., Disp: 60 tablet, Rfl: 2   ibuprofen (ADVIL,MOTRIN) 200 MG tablet, Take 200-400 mg by mouth daily as needed (pain)., Disp: , Rfl:    Insulin Glargine (BASAGLAR KWIKPEN) 100 UNIT/ML, Inject 15 Units into the skin daily., Disp: 15 mL, Rfl: 0   Insulin Pen Needle 32G X 4 MM MISC, use once daily, Disp: 100 each, Rfl: 0   spironolactone (ALDACTONE) 100 MG tablet, Take 1 tablet (100 mg total) by mouth daily., Disp: 30 tablet, Rfl: 2   promethazine-dextromethorphan (PROMETHAZINE-DM) 6.25-15 MG/5ML syrup, Take 5 mLs by mouth 3 (three) times daily as needed for cough. (Patient not  taking: Reported on 10/08/2022), Disp: 240 mL, Rfl: 0  Physical exam:  Vitals:   10/08/22 1122  BP: 136/75  Pulse: 100  Resp: 16  Temp: (!) 96.8 F (36 C)  Weight: 166 lb (75.3 kg)   Physical Exam Cardiovascular:     Rate and Rhythm: Normal rate and regular rhythm.     Heart sounds: Normal heart sounds.  Pulmonary:     Effort: Pulmonary effort is normal.     Breath sounds: Normal breath sounds.  Abdominal:     General: Bowel sounds are normal.     Palpations: Abdomen is soft.  Skin:    General: Skin is warm and dry.  Neurological:     Mental Status: She is alert and oriented to person, place, and time.         Latest Ref Rng & Units 10/08/2022   10:51 AM  CMP  Glucose 70 - 99 mg/dL 119   BUN 6 -  20 mg/dL 19   Creatinine 1.94 - 1.00 mg/dL 1.74   Sodium 081 - 448 mmol/L 134   Potassium 3.5 - 5.1 mmol/L 4.1   Chloride 98 - 111 mmol/L 106   CO2 22 - 32 mmol/L 23   Calcium 8.9 - 10.3 mg/dL 8.5   Total Protein 6.5 - 8.1 g/dL 6.5   Total Bilirubin 0.3 - 1.2 mg/dL 1.5   Alkaline Phos 38 - 126 U/L 79   AST 15 - 41 U/L 197   ALT 0 - 44 U/L 140       Latest Ref Rng & Units 10/08/2022   10:51 AM  CBC  WBC 4.0 - 10.5 K/uL 2.3   Hemoglobin 12.0 - 15.0 g/dL 18.5   Hematocrit 63.1 - 46.0 % 35.1   Platelets 150 - 400 K/uL 56     No images are attached to the encounter.  MR Abdomen W Wo Contrast  Result Date: 10/05/2022 CLINICAL DATA:  Follow-up indeterminate liver lesion. Cirrhosis. Elevated alpha fetoprotein. EXAM: MRI ABDOMEN WITHOUT AND WITH CONTRAST TECHNIQUE: Multiplanar multisequence MR imaging of the abdomen was performed both before and after the administration of intravenous contrast. CONTRAST:  7.50mL GADAVIST GADOBUTROL 1 MMOL/ML IV SOLN COMPARISON:  03/02/2022 FINDINGS: Lower chest: No acute findings. Hepatobiliary: Hepatic cirrhosis is again demonstrated. No focal areas of arterial phase hyperenhancement are seen on today's exam. No other liver lesions identified on  any sequences including diffusion imaging. Portal and hepatic veins are patent. Portosystemic venous collaterals are seen in the left upper quadrant with mild esophageal varices. Several small gallstones are seen. No evidence of cholecystitis or biliary ductal dilatation. Pancreas:  No mass or inflammatory changes. Spleen: Stable marked splenomegaly, with length measuring approximately 19 cm. Adrenals/Urinary Tract: No suspicious masses identified. No evidence of hydronephrosis. Stomach/Bowel: Unremarkable. Vascular/Lymphatic: No pathologically enlarged lymph nodes identified. No acute vascular findings. Other:  None. Musculoskeletal:  No suspicious bone lesions identified. IMPRESSION: Hepatic cirrhosis.  No evidence of hepatocellular carcinoma. Findings of portal venous hypertension, including splenomegaly and mild esophageal varices. Cholelithiasis. No radiographic evidence of cholecystitis or biliary ductal dilatation. Electronically Signed   By: Danae Orleans M.D.   On: 10/05/2022 13:15     Assessment and plan- Patient is a 57 y.o. female with history of liver cirrhosis here to discuss MRI results and further management  I have reviewed MRI abdomen images independently and discussed findings with the patient which does not show any evidence of liver lesions.  She has evidence of hepatic cirrhosis, splenomegaly and portal hypertension which is chronic.  Moving forward I will see her every 6 months with CBC with differential CMP AFP and ultrasound abdomen prior.  Patient does need to follow-up with GI as well given her ongoing cirrhosis and abnormal LFTs     Visit Diagnosis 1. Other cirrhosis of liver      Dr. Owens Shark, MD, MPH [facility] at [facility] [phone] 10/08/2022 2:53 PM

## 2022-10-09 ENCOUNTER — Other Ambulatory Visit: Payer: Self-pay | Admitting: *Deleted

## 2022-10-09 DIAGNOSIS — K7469 Other cirrhosis of liver: Secondary | ICD-10-CM

## 2022-10-09 LAB — AFP TUMOR MARKER: AFP, Serum, Tumor Marker: 12.9 ng/mL — ABNORMAL HIGH (ref 0.0–9.2)

## 2022-10-12 ENCOUNTER — Other Ambulatory Visit: Payer: Self-pay | Admitting: Family

## 2022-10-12 ENCOUNTER — Other Ambulatory Visit: Payer: Self-pay

## 2022-10-15 ENCOUNTER — Other Ambulatory Visit: Payer: Self-pay

## 2022-10-15 MED ORDER — ACCU-CHEK GUIDE VI STRP
ORAL_STRIP | 0 refills | Status: DC
  Filled 2022-10-15: qty 100, 25d supply, fill #0

## 2022-10-15 NOTE — Telephone Encounter (Signed)
Requested Prescriptions  Pending Prescriptions Disp Refills   glucose blood (ACCU-CHEK GUIDE) test strip 100 each 0    Sig: Use as directed up to four times daily.     Endocrinology: Diabetes - Testing Supplies Passed - 10/12/2022  5:31 PM      Passed - Valid encounter within last 12 months    Recent Outpatient Visits           1 month ago Primary hypertension   [place] [facility], [place], NP   3 months ago Primary hypertension   [place] [facility], Amy J, NP   4 months ago Type 2 diabetes mellitus with hyperglycemia, with long-term current use of insulin [facility])   [place] [facility], [place], NP   5 months ago Hospital discharge follow-up   [facility], [place], NP   6 months ago Chronic cough   [place] [facility], Salomon Fick, NP       Future Appointments             In 1 month [name], NP [facility]

## 2022-10-25 ENCOUNTER — Other Ambulatory Visit: Payer: Self-pay | Admitting: Family

## 2022-10-25 DIAGNOSIS — E1165 Type 2 diabetes mellitus with hyperglycemia: Secondary | ICD-10-CM

## 2022-10-26 ENCOUNTER — Other Ambulatory Visit: Payer: Self-pay

## 2022-10-26 ENCOUNTER — Other Ambulatory Visit: Payer: Self-pay | Admitting: Family

## 2022-10-26 DIAGNOSIS — D539 Nutritional anemia, unspecified: Secondary | ICD-10-CM

## 2022-10-26 MED ORDER — ACCU-CHEK GUIDE VI STRP
ORAL_STRIP | 0 refills | Status: DC
  Filled 2022-10-26 (×2): qty 100, fill #0
  Filled 2022-11-07: qty 100, 25d supply, fill #0

## 2022-10-26 MED ORDER — BASAGLAR KWIKPEN 100 UNIT/ML ~~LOC~~ SOPN
15.0000 [IU] | PEN_INJECTOR | Freq: Every day | SUBCUTANEOUS | 0 refills | Status: DC
Start: 2022-10-26 — End: 2022-11-30
  Filled 2022-10-26: qty 3, 20d supply, fill #0
  Filled 2022-11-16: qty 3, 20d supply, fill #1
  Filled 2022-11-16: qty 6, 40d supply, fill #1
  Filled 2022-11-28: qty 6, 40d supply, fill #2

## 2022-10-26 NOTE — Telephone Encounter (Signed)
Requested Prescriptions  Pending Prescriptions Disp Refills   Insulin Glargine (BASAGLAR KWIKPEN) 100 UNIT/ML 15 mL 0    Sig: Inject 15 Units into the skin daily.     Endocrinology:  Diabetes - Insulins Passed - 10/25/2022  5:24 PM      Passed - HBA1C is between 0 and 7.9 and within 180 days    HbA1c, POC (prediabetic range)  Date Value Ref Range Status  08/29/2022 6.2 5.7 - 6.4 % Final         Passed - Valid encounter within last 6 months    Recent Outpatient Visits           1 month ago Primary hypertension   [place] [facility], Amy J, NP   4 months ago Primary hypertension   [place] [facility], Amy J, NP   5 months ago Type 2 diabetes mellitus with hyperglycemia, with long-term current use of insulin [facility])   [place] [facility], [place], NP   5 months ago Hospital discharge follow-up   [facility], [place], NP   7 months ago Chronic cough   [place] [facility], [place], NP       Future Appointments             In 1 month [name], NP [facility]             glucose blood (ACCU-CHEK GUIDE) test strip 100 each 0    Sig: Use as directed up to four times daily.     Endocrinology: Diabetes - Testing Supplies Passed - 10/25/2022  5:24 PM      Passed - Valid encounter within last 12 months    Recent Outpatient Visits           1 month ago Primary hypertension   [place] [facility], [place], NP   4 months ago Primary hypertension   [place] [facility], Amy J, NP   5 months ago Type 2 diabetes mellitus with hyperglycemia, with long-term current use of insulin [facility])   [place] [facility], [place], NP   5 months ago Hospital discharge follow-up   [facility], [place], NP   7 months ago Chronic cough    [facility] at [facility], Salomon Fick, NP       Future Appointments             In 1 month [name], NP [facility] at [facility]

## 2022-10-26 NOTE — Telephone Encounter (Signed)
Requested Prescriptions  Pending Prescriptions Disp Refills   folic acid (FOLVITE) 1 MG tablet [Pharmacy Med Name: FOLIC ACID 1 MG TABLET] 90 tablet 3    Sig: TAKE 1 TABLET BY MOUTH EVERY DAY     Endocrinology:  Vitamins Passed - 10/26/2022  1:59 AM      Passed - Valid encounter within last 12 months    Recent Outpatient Visits           1 month ago Primary hypertension   [place] [facility], Amy J, NP   4 months ago Primary hypertension   [place] [facility], Amy J, NP   5 months ago Type 2 diabetes mellitus with hyperglycemia, with long-term current use of insulin [facility])   [place] [facility], [place], NP   5 months ago Hospital discharge follow-up   [facility], [place], NP   7 months ago Chronic cough   [place] [facility], Sal[place] Fick, NP       Future Appointments             In 1 month [name], NP [facility]

## 2022-11-07 ENCOUNTER — Other Ambulatory Visit: Payer: Self-pay

## 2022-11-16 ENCOUNTER — Other Ambulatory Visit: Payer: Self-pay

## 2022-11-19 ENCOUNTER — Other Ambulatory Visit: Payer: Self-pay

## 2022-11-25 ENCOUNTER — Other Ambulatory Visit: Payer: Self-pay | Admitting: Family

## 2022-11-25 DIAGNOSIS — I10 Essential (primary) hypertension: Secondary | ICD-10-CM

## 2022-11-27 ENCOUNTER — Other Ambulatory Visit: Payer: Self-pay | Admitting: Family

## 2022-11-27 ENCOUNTER — Other Ambulatory Visit: Payer: Self-pay

## 2022-11-27 DIAGNOSIS — I10 Essential (primary) hypertension: Secondary | ICD-10-CM

## 2022-11-27 NOTE — Progress Notes (Signed)
Patient ID: Mary Anderson, female    DOB: Oct 07, 1965  MRN: [id number]  CC: Chronic Care Management   Subjective: Mary Anderson is a 57 y.o. female who presents for chronic care management.   Her concerns today include:  - Doing well on Hydralazine, Spironolactone, Carvedilol, and Furosemide, no issues/concerns. The patient does not complain of red flag symptoms such as but not limited to chest pain, shortness of breath, worst headache of life, nausea/vomiting. Home blood pressures at goal. - Doing well on Insulin Glargine, no issues/concerns. The patient denies red flag symptoms associated with diabetes. Home blood sugars 110's - 120's.  - No further issues/concerns for discussion today.  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     Blood Glucose Monitoring Suppl (ACCU-CHEK GUIDE) w/Device KIT Use as directed up to four times daily 1 kit 0   folic acid (FOLVITE) 1 MG tablet TAKE 1 TABLET BY MOUTH EVERY DAY 90 tablet 3   glucose blood (ACCU-CHEK GUIDE) test strip Use as directed up to four times daily. 100 each  0   ibuprofen (ADVIL,MOTRIN) 200 MG tablet Take 200-400 mg by mouth daily as needed (pain).     promethazine-dextromethorphan (PROMETHAZINE-DM) 6.25-15 MG/5ML syrup Take 5 mLs by mouth 3 (three) times daily as needed for cough. (Patient not taking: Reported on 10/08/2022) 240 mL 0   No current facility-administered medications on file prior to visit.    No Known Allergies  Social History   Socioeconomic History   Marital status: Married    Spouse name: Not on file   Number of children: Not on file   Years of education: Not on file   Highest education level: 11th grade  Occupational History   Not on file  Tobacco Use   Smoking status: Never    Passive exposure: Never   Smokeless tobacco: Never  Vaping Use   Vaping Use: Never used  Substance and Sexual Activity   Alcohol use: Not Currently    Comment: no alcohol the past 6 months.   Drug use: No   Sexual activity: Not on file  Other Topics Concern   Not on file  Social History Narrative   Not on file   Social Determinants of Health   Financial Resource Strain: Medium Risk (11/28/2022)   Overall Financial Resource Strain (CARDIA)    Difficulty of Paying Living Expenses: Somewhat hard  Food Insecurity: No Food Insecurity (11/28/2022)   Hunger Vital Sign    Worried About Running Out of Food in the Last Year: Never true    Ran Out of Food in the Last Year: Never true  Transportation Needs: No Transportation Needs (11/28/2022)   PRAPARE - Administrator, Civil Service (Medical): No    Lack of Transportation (Non-Medical): No  Physical Activity: Insufficiently Active (11/28/2022)   Exercise Vital Sign    Days of Exercise per Week: 3 days    Minutes of Exercise per Session: 20 min  Stress: No Stress Concern Present (11/28/2022)   [employer] of Occupational Health - Occupational Stress Questionnaire    Feeling of Stress : Not at all  Social Connections: Moderately Integrated (11/28/2022)   Social Connection and  Isolation Panel [NHANES]    Frequency of Communication with Friends and Family: More than three times a week    Frequency of Social Gatherings with Friends and Family: Once a week    Attends Religious Services: 1 to 4 times per year    Active Member of [employer] or Organizations: No    Attends Engineer, structural: Not on file    Marital Status: Married  Catering manager Violence: Not At Risk (04/19/2022)   Humiliation, Afraid, Rape, and Kick questionnaire    Fear of Current or Ex-Partner: No    Emotionally Abused: No    Physically Abused: No    Sexually Abused: No    Family History  Problem Relation Age of Onset   Hypertension Mother    Colon cancer Mother    Hypertension Brother    Cancer Maternal Uncle    Cancer Maternal Uncle    Clotting disorder Neg Hx    Diabetes Neg Hx     Past Surgical History:  Procedure Laterality Date   ESOPHAGOGASTRODUODENOSCOPY (EGD) WITH PROPOFOL N/A 01/30/2022   Procedure: ESOPHAGOGASTRODUODENOSCOPY (EGD) WITH PROPOFOL;  Surgeon: Midge Minium, MD;  Location: [facility] ENDOSCOPY;  Service: Endoscopy;  Laterality: N/A;   IR THORACENTESIS ASP PLEURAL SPACE W/IMG GUIDE  10/27/2021    ROS: Review of Systems Negative except as stated above  PHYSICAL EXAM: BP 136/83 (BP Location: Right Arm, Patient Position: Sitting, Cuff Size: Normal)   Pulse 62   Temp 98 F (36.7 C)   Resp 16   Ht 5\' 4"  (1.626 m)   Wt 165 lb 9.6 oz (75.1 kg)   LMP 04/22/2011   SpO2 98%   BMI 28.43 kg/m   Physical Exam HENT:     Head: Normocephalic and atraumatic.  Eyes:     Extraocular Movements: Extraocular movements intact.     Conjunctiva/sclera: Conjunctivae normal.     Pupils: Pupils are equal, round, and reactive to light.  Cardiovascular:     Rate and Rhythm: Normal rate and regular rhythm.     Pulses: Normal pulses.     Heart sounds: Normal heart sounds.  Pulmonary:     Effort: Pulmonary effort is normal.     Breath sounds: Normal breath sounds.   Musculoskeletal:     Cervical back: Normal range of motion and neck supple.  Neurological:     General: No focal deficit present.     Mental Status: She is alert and oriented to person, place, and time.  Psychiatric:        Mood and Affect: Mood normal.        Behavior: Behavior normal.     Results for orders placed or performed in visit on 11/30/22  POCT glycosylated hemoglobin (Hb A1C)  Result Value Ref Range   Hemoglobin A1C     HbA1c POC (<> result, manual entry)     HbA1c, POC (prediabetic range)     HbA1c,  POC (controlled diabetic range) 6.5 0.0 - 7.0 %    ASSESSMENT AND PLAN: 1. Primary hypertension - Continue Carvedilol, Furosemide, Hydralazine, and Spironolactone as prescribed. - Counseled on blood pressure goal of less than 130/80, low-sodium, DASH diet, medication compliance, and 150 minutes of moderate intensity exercise per week as tolerated. Counseled on medication adherence and adverse effects. - Follow-up with primary provider in 3 months or sooner if needed. - carvedilol (COREG) 6.25 MG tablet; Take 1 tablet (6.25 mg total) by mouth 2 (two) times daily with a meal.  Dispense: 180 tablet; Refill: 0 - furosemide (LASIX) 40 MG tablet; Take 1 tablet (40 mg total) by mouth daily.  Dispense: 90 tablet; Refill: 0 - hydrALAZINE (APRESOLINE) 25 MG tablet; Take 1 tablet (25 mg total) by mouth 2 (two) times daily.  Dispense: 180 tablet; Refill: 0 - spironolactone (ALDACTONE) 100 MG tablet; Take 1 tablet (100 mg total) by mouth daily.  Dispense: 90 tablet; Refill: 0  2. Type 2 diabetes mellitus with hyperglycemia, with long-term current use of insulin (HCC) - Hemoglobin A1c at goal at 6.5%, goal 7%. This is increased from previous 6.2%. - Continue Insulin Glargine as prescribed. - Discussed the importance of healthy eating habits, low-carbohydrate diet, low-sugar diet, regular aerobic exercise (at least 150 minutes a week as tolerated) and medication compliance to achieve or  maintain control of diabetes. - Follow-up with primary provider in 3 months or sooner if needed.  - POCT glycosylated hemoglobin (Hb A1C) - Insulin Glargine (BASAGLAR KWIKPEN) 100 UNIT/ML; Inject 15 Units into the skin daily.  Dispense: 15 mL; Refill: 0 - Insulin Pen Needle 32G X 4 MM MISC; use once daily  Dispense: 100 each; Refill: 0  3. Encounter for diabetic foot exam [facility]) - Referral to Podiatry for further evaluation/management. - Ambulatory referral to Podiatry   Patient was given the opportunity to ask questions.  Patient verbalized understanding of the plan and was able to repeat key elements of the plan. Patient was given clear instructions to go to Emergency Department or return to medical center if symptoms don't improve, worsen, or new problems develop.The patient verbalized understanding.   Orders Placed This Encounter  Procedures   Ambulatory referral to Podiatry   POCT glycosylated hemoglobin (Hb A1C)     Requested Prescriptions   Signed Prescriptions Disp Refills   carvedilol (COREG) 6.25 MG tablet 180 tablet 0    Sig: Take 1 tablet (6.25 mg total) by mouth 2 (two) times daily with a meal.   furosemide (LASIX) 40 MG tablet 90 tablet 0    Sig: Take 1 tablet (40 mg total) by mouth daily.   hydrALAZINE (APRESOLINE) 25 MG tablet 180 tablet 0    Sig: Take 1 tablet (25 mg total) by mouth 2 (two) times daily.   spironolactone (ALDACTONE) 100 MG tablet 90 tablet 0    Sig: Take 1 tablet (100 mg total) by mouth daily.   Insulin Glargine (BASAGLAR KWIKPEN) 100 UNIT/ML 15 mL 0    Sig: Inject 15 Units into the skin daily.   Insulin Pen Needle 32G X 4 MM MISC 100 each 0    Sig: use once daily    Return in about 3 months (around 03/02/2023) for Follow-Up or next available chronic care mgmt .  [name], NP

## 2022-11-28 ENCOUNTER — Other Ambulatory Visit: Payer: Self-pay

## 2022-11-28 ENCOUNTER — Other Ambulatory Visit: Payer: Self-pay | Admitting: Family

## 2022-11-28 DIAGNOSIS — E1165 Type 2 diabetes mellitus with hyperglycemia: Secondary | ICD-10-CM

## 2022-11-28 MED ORDER — FUROSEMIDE 40 MG PO TABS
40.0000 mg | ORAL_TABLET | Freq: Every day | ORAL | 2 refills | Status: DC
Start: 2022-11-28 — End: 2022-11-30
  Filled 2022-11-28: qty 30, 30d supply, fill #0

## 2022-11-28 MED ORDER — CARVEDILOL 6.25 MG PO TABS
6.2500 mg | ORAL_TABLET | Freq: Two times a day (BID) | ORAL | 2 refills | Status: DC
Start: 2022-11-28 — End: 2022-11-30
  Filled 2022-11-28: qty 60, 30d supply, fill #0

## 2022-11-28 MED ORDER — SPIRONOLACTONE 100 MG PO TABS
100.0000 mg | ORAL_TABLET | Freq: Every day | ORAL | 2 refills | Status: DC
Start: 2022-11-28 — End: 2022-11-30
  Filled 2022-11-28: qty 30, 30d supply, fill #0

## 2022-11-28 MED ORDER — HYDRALAZINE HCL 25 MG PO TABS
25.0000 mg | ORAL_TABLET | Freq: Two times a day (BID) | ORAL | 2 refills | Status: DC
Start: 2022-11-28 — End: 2022-11-30
  Filled 2022-11-28: qty 60, 30d supply, fill #0

## 2022-11-28 NOTE — Telephone Encounter (Signed)
Requested Prescriptions  Pending Prescriptions Disp Refills   carvedilol (COREG) 6.25 MG tablet 60 tablet 2    Sig: Take 1 tablet (6.25 mg total) by mouth 2 (two) times daily with a meal.     Cardiovascular: Beta Blockers 3 Failed - 11/27/2022  5:40 PM      Failed - AST in normal range and within 360 days    AST  Date Value Ref Range Status  10/08/2022 197 (HH) 15 - 41 U/L Final    Comment:    CRITICAL RESULT CALLED TO, READ BACK BY AND VERIFIED WITH VENABLE,S AT 1140 ON 4.8.24 BY ISLEY,B REPEATED TO VERIFY          Failed - ALT in normal range and within 360 days    ALT  Date Value Ref Range Status  10/08/2022 140 (H) 0 - 44 U/L Final         Passed - Cr in normal range and within 360 days    Creatinine  Date Value Ref Range Status  10/08/2022 0.93 0.44 - 1.00 mg/dL Final         Passed - Last BP in normal range    BP Readings from Last 1 Encounters:  10/08/22 136/75         Passed - Last Heart Rate in normal range    Pulse Readings from Last 1 Encounters:  10/08/22 100         Passed - Valid encounter within last 6 months    Recent Outpatient Visits           3 months ago Primary hypertension   [place] [facility], [place]   5 months ago Primary hypertension   [place] [facility], Amy J, NP   6 months ago Type 2 diabetes mellitus with hyperglycemia, with long-term current use of insulin [facility])   [place] [facility], [place], [place]   7 months ago Hospital discharge follow-up   [facility], [place], [place]   8 months ago Chronic cough   [place] [facility], [place], [place]       Future Appointments             In 2 days [name], NP [place] [facility]             furosemide (LASIX) 40 MG tablet 30 tablet 2    Sig: Take 1 tablet (40 mg total) by mouth daily.      Cardiovascular:  Diuretics - Loop Failed - 11/27/2022  5:40 PM      Failed - Ca in normal range and within 180 days    Calcium  Date Value Ref Range Status  10/08/2022 8.5 (L) 8.9 - 10.3 mg/dL Final   Calcium, Ion  Date Value Ref Range Status  04/18/2022 1.09 (L) 1.15 - 1.40 mmol/L Final         Failed - Na in normal range and within 180 days    Sodium  Date Value Ref Range Status  10/08/2022 134 (L) 135 - 145 mmol/L Final  08/29/2022 142 134 - 144 mmol/L Final         Failed - Mg Level in normal range and within 180 days    Magnesium  Date Value Ref Range Status  04/19/2022 1.9 1.7 - 2.4 mg/dL Final  Comment:    Performed at [facility], [address], [place], [place] [address]         Passed - K in normal range and within 180 days    Potassium  Date Value Ref Range Status  10/08/2022 4.1 3.5 - 5.1 mmol/L Final         Passed - Cr in normal range and within 180 days    Creatinine  Date Value Ref Range Status  10/08/2022 0.93 0.44 - 1.00 mg/dL Final         Passed - Cl in normal range and within 180 days    Chloride  Date Value Ref Range Status  10/08/2022 106 98 - 111 mmol/L Final         Passed - Last BP in normal range    BP Readings from Last 1 Encounters:  10/08/22 136/75         Passed - Valid encounter within last 6 months    Recent Outpatient Visits           3 months ago Primary hypertension   [place] [facility], Amy J, NP   5 months ago Primary hypertension   [place] [facility], Amy J, NP   6 months ago Type 2 diabetes mellitus with hyperglycemia, with long-term current use of insulin [facility])   [place] [facility], [place], [place]   7 months ago Hospital discharge follow-up   [facility], Amy J, NP   8 months ago Chronic cough    [facility] at [facility], [place], [place]       Future  Appointments             In 2 days [name], NP [facility]             hydrALAZINE (APRESOLINE) 25 MG tablet 60 tablet 2    Sig: Take 1 tablet (25 mg total) by mouth 2 (two) times daily.     Cardiovascular:  Vasodilators Failed - 11/27/2022  5:40 PM      Failed - HCT in normal range and within 360 days    HCT  Date Value Ref Range Status  10/08/2022 35.1 (L) 36.0 - 46.0 % Final   Hematocrit  Date Value Ref Range Status  05/01/2022 37.1 34.0 - 46.6 % Final         Failed - RBC in normal range and within 360 days    RBC  Date Value Ref Range Status  10/08/2022 3.51 (L) 3.87 - 5.11 MIL/uL Final         Failed - WBC in normal range and within 360 days    WBC  Date Value Ref Range Status  04/19/2022 6.6 4.0 - 10.5 K/uL Final   WBC Count  Date Value Ref Range Status  10/08/2022 2.3 (L) 4.0 - 10.5 K/uL Final         Failed - PLT in normal range and within 360 days    Platelets  Date Value Ref Range Status  05/01/2022 85 (LL) 150 - 450 x10E3/uL Final    Comment:    Actual platelet count may be somewhat higher than reported due to aggregation of platelets in this sample.    Platelet Count  Date Value Ref Range Status  10/08/2022 56 (L) 150 - 400  K/uL Final    Comment:    SPECIMEN CHECKED FOR CLOTS         Failed - ANA Screen, Ifa, Serum in normal range and within 360 days    No results found for: "ANA", "ANATITER", "LABANTI"       Passed - HGB in normal range and within 360 days    Hemoglobin  Date Value Ref Range Status  10/08/2022 12.5 12.0 - 15.0 g/dL Final  40/98/1191 47.8 11.1 - 15.9 g/dL Final         Passed - Last BP in normal range    BP Readings from Last 1 Encounters:  10/08/22 136/75         Passed - Valid encounter within last 12 months    Recent Outpatient Visits           3 months ago Primary hypertension   [place] [facility], Amy J, NP   5 months ago Primary  hypertension   [place] [facility], Amy J, NP   6 months ago Type 2 diabetes mellitus with hyperglycemia, with long-term current use of insulin [facility])   [place] [facility], [place]   7 months ago Hospital discharge follow-up   [facility], [place], [place]   8 months ago Chronic cough   [place] [facility], [place]       Future Appointments             In 2 days [name], NP [place] [facility]             spironolactone (ALDACTONE) 100 MG tablet 30 tablet 2    Sig: Take 1 tablet (100 mg total) by mouth daily.     Cardiovascular: Diuretics - Aldosterone Antagonist Failed - 11/27/2022  5:40 PM      Failed - Na in normal range and within 180 days    Sodium  Date Value Ref Range Status  10/08/2022 134 (L) 135 - 145 mmol/L Final  08/29/2022 142 134 - 144 mmol/L Final         Passed - Cr in normal range and within 180 days    Creatinine  Date Value Ref Range Status  10/08/2022 0.93 0.44 - 1.00 mg/dL Final         Passed - K in normal range and within 180 days    Potassium  Date Value Ref Range Status  10/08/2022 4.1 3.5 - 5.1 mmol/L Final         Passed - eGFR is 30 or above and within 180 days    GFR calc Af Amer  Date Value Ref Range Status  09/02/2017 >60 >60 mL/min Final    Comment:    (NOTE) The eGFR has been calculated using the CKD EPI equation. This calculation has not been validated in all clinical situations. eGFR's persistently <60 mL/min signify possible Chronic Kidney Disease.    GFR, Estimated  Date Value Ref Range Status  10/08/2022 >60 >60 mL/min Final    Comment:    (NOTE) Calculated using the CKD-EPI Creatinine Equation (2021)    eGFR  Date Value Ref Range Status  08/29/2022 77 >59 mL/min/1.73 Final         Passed - Last BP in normal range    BP Readings from Last 1  Encounters:  10/08/22 136/75         Passed - Valid encounter within last 6 months    Recent Outpatient Visits           3 months ago Primary hypertension   [place] [facility], [place]   5 months ago Primary hypertension   [place] [facility], Amy J, NP   6 months ago Type 2 diabetes mellitus with hyperglycemia, with long-term current use of insulin [facility])   [place] [facility], [place]   7 months ago Hospital discharge follow-up   [facility], [place], [place]   8 months ago Chronic cough   [place] [facility], Salomon Fick, NP       Future Appointments             In 2 days [name], NP [facility] at [facility]

## 2022-11-29 ENCOUNTER — Other Ambulatory Visit: Payer: Self-pay

## 2022-11-29 MED ORDER — INSULIN PEN NEEDLE 32G X 4 MM MISC
1.0000 | Freq: Every day | 0 refills | Status: DC
Start: 2022-11-29 — End: 2022-11-30
  Filled 2022-11-29: qty 100, 100d supply, fill #0

## 2022-11-29 MED ORDER — ACCU-CHEK GUIDE VI STRP
ORAL_STRIP | 0 refills | Status: AC
  Filled 2022-11-29: qty 100, 25d supply, fill #0

## 2022-11-30 ENCOUNTER — Other Ambulatory Visit: Payer: Self-pay

## 2022-11-30 ENCOUNTER — Encounter: Payer: Self-pay | Admitting: Family

## 2022-11-30 ENCOUNTER — Ambulatory Visit (INDEPENDENT_AMBULATORY_CARE_PROVIDER_SITE_OTHER): Payer: Self-pay | Admitting: Family

## 2022-11-30 VITALS — BP 136/83 | HR 62 | Temp 98.0°F | Resp 16 | Ht 64.0 in | Wt 165.6 lb

## 2022-11-30 DIAGNOSIS — Z794 Long term (current) use of insulin: Secondary | ICD-10-CM

## 2022-11-30 DIAGNOSIS — I10 Essential (primary) hypertension: Secondary | ICD-10-CM

## 2022-11-30 DIAGNOSIS — E119 Type 2 diabetes mellitus without complications: Secondary | ICD-10-CM

## 2022-11-30 DIAGNOSIS — E1165 Type 2 diabetes mellitus with hyperglycemia: Secondary | ICD-10-CM

## 2022-11-30 LAB — POCT GLYCOSYLATED HEMOGLOBIN (HGB A1C): HbA1c, POC (controlled diabetic range): 6.5 % (ref 0.0–7.0)

## 2022-11-30 MED ORDER — INSULIN PEN NEEDLE 32G X 4 MM MISC
1.0000 | Freq: Every day | 0 refills | Status: DC
Start: 2022-11-30 — End: 2023-03-05
  Filled 2022-11-30 – 2023-01-02 (×2): qty 100, 100d supply, fill #0

## 2022-11-30 MED ORDER — HYDRALAZINE HCL 25 MG PO TABS
25.0000 mg | ORAL_TABLET | Freq: Two times a day (BID) | ORAL | 0 refills | Status: DC
Start: 2022-11-30 — End: 2023-03-05
  Filled 2022-11-30 – 2023-01-02 (×3): qty 180, 90d supply, fill #0

## 2022-11-30 MED ORDER — CARVEDILOL 6.25 MG PO TABS
6.2500 mg | ORAL_TABLET | Freq: Two times a day (BID) | ORAL | 0 refills | Status: DC
Start: 2022-11-30 — End: 2023-03-05
  Filled 2022-11-30 – 2023-01-02 (×3): qty 180, 90d supply, fill #0

## 2022-11-30 MED ORDER — FUROSEMIDE 40 MG PO TABS
40.0000 mg | ORAL_TABLET | Freq: Every day | ORAL | 0 refills | Status: DC
Start: 2022-11-30 — End: 2023-03-05
  Filled 2022-11-30 – 2023-01-02 (×3): qty 90, 90d supply, fill #0

## 2022-11-30 MED ORDER — SPIRONOLACTONE 100 MG PO TABS
100.0000 mg | ORAL_TABLET | Freq: Every day | ORAL | 0 refills | Status: DC
Start: 2022-11-30 — End: 2023-03-05
  Filled 2022-11-30 – 2023-01-02 (×3): qty 90, 90d supply, fill #0

## 2022-11-30 MED ORDER — BASAGLAR KWIKPEN 100 UNIT/ML ~~LOC~~ SOPN
15.0000 [IU] | PEN_INJECTOR | Freq: Every day | SUBCUTANEOUS | 0 refills | Status: DC
Start: 2022-11-30 — End: 2023-03-05
  Filled 2022-11-30: qty 15, 100d supply, fill #0
  Filled 2022-12-26: qty 6, 40d supply, fill #0
  Filled 2023-01-02: qty 15, 100d supply, fill #0

## 2022-11-30 NOTE — Progress Notes (Signed)
Pt is here for chronic care mgt    

## 2022-12-26 ENCOUNTER — Other Ambulatory Visit: Payer: Self-pay

## 2023-01-01 ENCOUNTER — Other Ambulatory Visit: Payer: Self-pay

## 2023-01-02 ENCOUNTER — Other Ambulatory Visit: Payer: Self-pay

## 2023-01-12 ENCOUNTER — Encounter: Payer: Self-pay | Admitting: Gastroenterology

## 2023-01-18 ENCOUNTER — Encounter: Payer: Self-pay | Admitting: Family

## 2023-01-22 ENCOUNTER — Other Ambulatory Visit: Payer: Self-pay | Admitting: Family

## 2023-01-22 DIAGNOSIS — Z1211 Encounter for screening for malignant neoplasm of colon: Secondary | ICD-10-CM

## 2023-01-22 NOTE — Telephone Encounter (Signed)
Complete

## 2023-03-05 ENCOUNTER — Encounter: Payer: Self-pay | Admitting: Family

## 2023-03-05 ENCOUNTER — Other Ambulatory Visit: Payer: Self-pay

## 2023-03-05 ENCOUNTER — Ambulatory Visit (INDEPENDENT_AMBULATORY_CARE_PROVIDER_SITE_OTHER): Payer: Self-pay | Admitting: Family

## 2023-03-05 VITALS — BP 130/80 | HR 58 | Temp 98.2°F | Ht 64.25 in | Wt 167.6 lb

## 2023-03-05 DIAGNOSIS — Z13220 Encounter for screening for lipoid disorders: Secondary | ICD-10-CM

## 2023-03-05 DIAGNOSIS — Z794 Long term (current) use of insulin: Secondary | ICD-10-CM

## 2023-03-05 DIAGNOSIS — E1165 Type 2 diabetes mellitus with hyperglycemia: Secondary | ICD-10-CM

## 2023-03-05 DIAGNOSIS — I10 Essential (primary) hypertension: Secondary | ICD-10-CM

## 2023-03-05 LAB — POCT GLYCOSYLATED HEMOGLOBIN (HGB A1C): HbA1c, POC (controlled diabetic range): 6.5 % (ref 0.0–7.0)

## 2023-03-05 MED ORDER — BASAGLAR KWIKPEN 100 UNIT/ML ~~LOC~~ SOPN
15.0000 [IU] | PEN_INJECTOR | Freq: Every day | SUBCUTANEOUS | 0 refills | Status: DC
  Filled 2023-03-05 – 2023-04-07 (×2): qty 15, 100d supply, fill #0

## 2023-03-05 MED ORDER — INSULIN PEN NEEDLE 32G X 4 MM MISC
1.0000 | Freq: Every day | 0 refills | Status: DC
  Filled 2023-03-05: qty 100, 100d supply, fill #0

## 2023-03-05 MED ORDER — CARVEDILOL 6.25 MG PO TABS
6.2500 mg | ORAL_TABLET | Freq: Two times a day (BID) | ORAL | 0 refills | Status: DC
  Filled 2023-03-05 – 2023-03-28 (×2): qty 180, 90d supply, fill #0

## 2023-03-05 MED ORDER — HYDRALAZINE HCL 25 MG PO TABS
25.0000 mg | ORAL_TABLET | Freq: Two times a day (BID) | ORAL | 0 refills | Status: DC
  Filled 2023-03-05 – 2023-03-28 (×2): qty 180, 90d supply, fill #0

## 2023-03-05 MED ORDER — FUROSEMIDE 40 MG PO TABS
40.0000 mg | ORAL_TABLET | Freq: Every day | ORAL | 0 refills | Status: DC
  Filled 2023-03-05 – 2023-03-28 (×2): qty 90, 90d supply, fill #0

## 2023-03-05 MED ORDER — SPIRONOLACTONE 100 MG PO TABS
100.0000 mg | ORAL_TABLET | Freq: Every day | ORAL | 0 refills | Status: DC
  Filled 2023-03-05 – 2023-03-28 (×2): qty 90, 90d supply, fill #0

## 2023-03-05 NOTE — Progress Notes (Signed)
Pt states no concerns to talk about.

## 2023-03-05 NOTE — Progress Notes (Signed)
Patient ID: LAVENDA VILLALOVOS, female    DOB: [birth year]  MRN: [id number]  CC: Chronic Conditions Follow-Up  Subjective: Mary Anderson is a 57 y.o. female who presents for chronic conditions follow-up.  Her concerns today include:  - Doing well on Carvedilol, Furosemide, Hydralazine, and Spironolactone, no issues/concerns. Home blood pressures at goal. She does not complain of red flag symptoms such as but not limited to chest pain, shortness of breath, worst headache of life, nausea/vomiting.  - Doing well on Insulin Glargine, no issues/concerns. Home blood sugars at goal. She denies red flag symptoms associated with diabetes.   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 10/26/2021   Cirrhosis (HCC) 09/11/2017   Splenomegaly 09/11/2017   Thrombocytopenia (HCC) 09/11/2017   Calculus of gallbladder without cholecystitis without obstruction 09/10/2017   Chronic hepatitis C (HCC) 08/30/2017   Hypertension 07/31/2017     Current Outpatient Medications on File Prior to Visit  Medication Sig Dispense Refill   Accu-Chek Softclix Lancets lancets Use as directed up to four times daily 200 each 0   Blood Glucose Monitoring Suppl (ACCU-CHEK GUIDE) w/Device KIT Use as directed up to four times daily 1 kit 0   folic acid (FOLVITE) 1 MG tablet TAKE 1 TABLET BY MOUTH EVERY DAY 90 tablet 3   glucose blood (ACCU-CHEK GUIDE) test strip Use as directed up to four times daily. 100 each 0   acetaminophen (TYLENOL) 500 MG tablet Take 500-1,000 mg by mouth every 8 (eight) hours as needed for mild pain. (Patient not taking: Reported on 03/05/2023)     ibuprofen  (ADVIL,MOTRIN) 200 MG tablet Take 200-400 mg by mouth daily as needed (pain). (Patient not taking: Reported on 03/05/2023)     promethazine-dextromethorphan (PROMETHAZINE-DM) 6.25-15 MG/5ML syrup Take 5 mLs by mouth 3 (three) times daily as needed for cough. (Patient not taking: Reported on 10/08/2022) 240 mL 0   No current facility-administered medications on file prior to visit.    No Known Allergies  Social History   Socioeconomic History   Marital status: Married    Spouse name: Not on file   Number of children: Not on file   Years of education: Not on file   Highest education level: 11th grade  Occupational History   Not on file  Tobacco Use   Smoking status: Never    Passive exposure: Never   Smokeless tobacco: Never  Vaping Use   Vaping status: Never Used  Substance and Sexual Activity   Alcohol use: Not Currently    Comment: no alcohol the past 6 months.   Drug use: No   Sexual activity: Not on file  Other Topics Concern   Not on file  Social History Narrative   Not on file   Social Determinants of Health   Financial Resource Strain: Medium Risk (11/28/2022)   Overall Financial Resource Strain (CARDIA)    Difficulty of Paying Living Expenses: Somewhat hard  Food Insecurity: No Food Insecurity (11/28/2022)   Hunger Vital Sign    Worried About Running Out of Food in the Last Year: Never true    Ran Out of Food in the Last Year: Never true  Transportation Needs: No Transportation Needs (11/28/2022)   PRAPARE - Administrator, Civil Service (Medical): No    Lack of Transportation (Non-Medical): No  Physical Activity: Insufficiently Active (11/28/2022)   Exercise Vital Sign    Days of Exercise per Week: 3 days    Minutes of Exercise per Session: 20 min  Stress: No Stress Concern Present (11/28/2022)   [employer] of Occupational Health - Occupational Stress Questionnaire    Feeling of Stress : Not at all  Social Connections: Moderately Integrated  (11/28/2022)   Social Connection and Isolation Panel [NHANES]    Frequency of Communication with Friends and Family: More than three times a week    Frequency of Social Gatherings with Friends and Family: Once a week    Attends Religious Services: 1 to 4 times per year    Active Member of [employer] or Organizations: No    Attends Engineer, structural: Not on file    Marital Status: Married  Catering manager Violence: Not At Risk (04/19/2022)   Humiliation, Afraid, Rape, and Kick questionnaire    Fear of Current or Ex-Partner: No    Emotionally Abused: No    Physically Abused: No    Sexually Abused: No    Family History  Problem Relation Age of Onset   Hypertension Mother    Colon cancer Mother    Hypertension Brother    Cancer Maternal Uncle    Cancer Maternal Uncle    Clotting disorder Neg Hx    Diabetes Neg Hx     Past Surgical History:  Procedure Laterality Date   ESOPHAGOGASTRODUODENOSCOPY (EGD) WITH PROPOFOL N/A 01/30/2022   Procedure: ESOPHAGOGASTRODUODENOSCOPY (EGD) WITH PROPOFOL;  Surgeon: Midge Minium, MD;  Location: [facility] ENDOSCOPY;  Service: Endoscopy;  Laterality: N/A;   IR THORACENTESIS ASP PLEURAL SPACE W/IMG GUIDE  10/27/2021    ROS: Review of Systems Negative except as stated above  PHYSICAL EXAM: BP 130/80   Pulse (!) 58   Temp 98.2 F (36.8 C) (Oral)   Ht 5' 4.25" (1.632 m)   Wt 167 lb 9.6 oz (76 kg)   LMP 04/22/2011   SpO2 98%   BMI 28.55 kg/m   Physical Exam HENT:     Head: Normocephalic and atraumatic.     Nose: Nose normal.     Mouth/Throat:     Mouth: Mucous membranes are moist.     Pharynx: Oropharynx is clear.  Eyes:     Extraocular Movements: Extraocular movements intact.     Conjunctiva/sclera: Conjunctivae normal.     Pupils: Pupils are equal, round, and reactive to light.  Cardiovascular:     Rate and Rhythm: Bradycardia present.     Pulses: Normal pulses.     Heart sounds: Normal heart sounds.  Pulmonary:     Effort:  Pulmonary effort is normal.  Musculoskeletal:        General: Normal range of motion.     Cervical back: Normal range of motion and neck supple.  Neurological:     General: No focal deficit present.     Mental Status: She is alert and oriented to person, place, and time.  Psychiatric:        Mood and Affect: Mood normal.        Behavior: Behavior normal.     ASSESSMENT AND PLAN: 1. Primary hypertension - Continue Carvedilol, Furosemide, Hydralazine, and Spironolactone as prescribed.  - Routine screening.  - Counseled on blood pressure goal of less than 130/80, low-sodium,  DASH diet, medication compliance, and 150 minutes of moderate intensity exercise per week as tolerated. Counseled on medication adherence and adverse effects. - Follow-up with primary provider in 3 months or sooner if needed.  - Basic Metabolic Panel - carvedilol (COREG) 6.25 MG tablet; Take 1 tablet (6.25 mg total) by mouth 2 (two) times daily with a meal.  Dispense: 180 tablet; Refill: 0 - furosemide (LASIX) 40 MG tablet; Take 1 tablet (40 mg total) by mouth daily.  Dispense: 90 tablet; Refill: 0 - hydrALAZINE (APRESOLINE) 25 MG tablet; Take 1 tablet (25 mg total) by mouth 2 (two) times daily.  Dispense: 180 tablet; Refill: 0 - spironolactone (ALDACTONE) 100 MG tablet; Take 1 tablet (100 mg total) by mouth daily.  Dispense: 90 tablet; Refill: 0  2. Type 2 diabetes mellitus with hyperglycemia, with long-term current use of insulin (HCC) - Continue Insulin Glargine as prescribed.  - Routine screening.  - Discussed the importance of healthy eating habits, low-carbohydrate diet, low-sugar diet, regular aerobic exercise (at least 150 minutes a week as tolerated) and medication compliance to achieve or maintain control of diabetes. - Follow-up with primary provider as scheduled.  - POCT glycosylated hemoglobin (Hb A1C); Future - Microalbumin / creatinine urine ratio - Insulin Glargine (BASAGLAR KWIKPEN) 100 UNIT/ML;  Inject 15 Units into the skin daily.  Dispense: 15 mL; Refill: 0 - Insulin Pen Needle 32G X 4 MM MISC; use once daily  Dispense: 100 each; Refill: 0  3. Screening cholesterol level - Routine screening.  - Lipid panel   Patient was given the opportunity to ask questions.  Patient verbalized understanding of the plan and was able to repeat key elements of the plan. Patient was given clear instructions to go to Emergency Department or return to medical center if symptoms don't improve, worsen, or new problems develop.The patient verbalized understanding.   Orders Placed This Encounter  Procedures   Basic Metabolic Panel   Microalbumin / creatinine urine ratio   Lipid panel   POCT glycosylated hemoglobin (Hb A1C)     Requested Prescriptions   Signed Prescriptions Disp Refills   carvedilol (COREG) 6.25 MG tablet 180 tablet 0    Sig: Take 1 tablet (6.25 mg total) by mouth 2 (two) times daily with a meal.   furosemide (LASIX) 40 MG tablet 90 tablet 0    Sig: Take 1 tablet (40 mg total) by mouth daily.   hydrALAZINE (APRESOLINE) 25 MG tablet 180 tablet 0    Sig: Take 1 tablet (25 mg total) by mouth 2 (two) times daily.   Insulin Glargine (BASAGLAR KWIKPEN) 100 UNIT/ML 15 mL 0    Sig: Inject 15 Units into the skin daily.   spironolactone (ALDACTONE) 100 MG tablet 90 tablet 0    Sig: Take 1 tablet (100 mg total) by mouth daily.   Insulin Pen Needle 32G X 4 MM MISC 100 each 0    Sig: use once daily    Return in about 3 months (around 06/04/2023) for Follow-Up or next available chronic conditions .  [name], NP

## 2023-03-06 LAB — BASIC METABOLIC PANEL
BUN/Creatinine Ratio: 21 (ref 9–23)
BUN: 21 mg/dL (ref 6–24)
CO2: 27 mmol/L (ref 20–29)
Calcium: 9 mg/dL (ref 8.7–10.2)
Chloride: 99 mmol/L (ref 96–106)
Creatinine, Ser: 0.99 mg/dL (ref 0.57–1.00)
Glucose: 304 mg/dL — ABNORMAL HIGH (ref 70–99)
Potassium: 3.9 mmol/L (ref 3.5–5.2)
Sodium: 138 mmol/L (ref 134–144)
eGFR: 67 mL/min/{1.73_m2} (ref >59)

## 2023-03-06 LAB — LIPID PANEL
Chol/HDL Ratio: 2.9 ratio (ref 0.0–4.4)
Cholesterol, Total: 123 mg/dL (ref 100–199)
HDL: 42 mg/dL (ref >39)
LDL Chol Calc (NIH): 56 mg/dL (ref 0–99)
Triglycerides: 148 mg/dL (ref 0–149)
VLDL Cholesterol Cal: 25 mg/dL (ref 5–40)

## 2023-03-06 LAB — MICROALBUMIN / CREATININE URINE RATIO
Creatinine, Urine: 24.2 mg/dL
Microalb/Creat Ratio: <12 mg/g{creat} (ref 0–29)
Microalbumin, Urine: <3 ug/mL

## 2023-03-26 ENCOUNTER — Other Ambulatory Visit (HOSPITAL_COMMUNITY): Payer: Self-pay

## 2023-03-26 ENCOUNTER — Encounter (HOSPITAL_COMMUNITY): Payer: Self-pay

## 2023-03-26 ENCOUNTER — Emergency Department (HOSPITAL_COMMUNITY)
Admission: EM | Admit: 2023-03-26 | Discharge: 2023-03-27 | Disposition: A | Discharge status: Home / Self Care | Payer: Self-pay | Attending: Emergency Medicine | Admitting: Emergency Medicine

## 2023-03-26 ENCOUNTER — Telehealth: Payer: Self-pay | Admitting: Family

## 2023-03-26 ENCOUNTER — Other Ambulatory Visit: Payer: Self-pay

## 2023-03-26 ENCOUNTER — Emergency Department (HOSPITAL_COMMUNITY): Payer: Self-pay

## 2023-03-26 DIAGNOSIS — Z794 Long term (current) use of insulin: Secondary | ICD-10-CM | POA: Insufficient documentation

## 2023-03-26 DIAGNOSIS — E119 Type 2 diabetes mellitus without complications: Secondary | ICD-10-CM | POA: Insufficient documentation

## 2023-03-26 DIAGNOSIS — R2231 Localized swelling, mass and lump, right upper limb: Secondary | ICD-10-CM | POA: Insufficient documentation

## 2023-03-26 MED ORDER — DOXYCYCLINE HYCLATE 100 MG PO TABS
100.0000 mg | ORAL_TABLET | Freq: Two times a day (BID) | ORAL | 0 refills | Status: DC
  Filled 2023-03-27: qty 13, 7d supply, fill #0

## 2023-03-26 MED ORDER — NAPROXEN 250 MG PO TABS
500.0000 mg | ORAL_TABLET | Freq: Once | ORAL | Status: AC
  Administered 2023-03-26: 500 mg via ORAL
  Filled 2023-03-26: qty 2

## 2023-03-26 MED ORDER — OXYCODONE-ACETAMINOPHEN 5-325 MG PO TABS
1.0000 | ORAL_TABLET | Freq: Once | ORAL | Status: AC
  Administered 2023-03-26: 1 via ORAL
  Filled 2023-03-26: qty 1

## 2023-03-26 MED ORDER — NAPROXEN 500 MG PO TABS
500.0000 mg | ORAL_TABLET | Freq: Two times a day (BID) | ORAL | 0 refills | Status: DC
  Filled 2023-03-26: qty 14, 7d supply, fill #0

## 2023-03-26 MED ORDER — NAPROXEN 500 MG PO TABS
500.0000 mg | ORAL_TABLET | Freq: Two times a day (BID) | ORAL | 0 refills | Status: AC
  Filled 2023-03-27: qty 14, 7d supply, fill #0

## 2023-03-26 MED ORDER — DOXYCYCLINE HYCLATE 100 MG PO TABS
100.0000 mg | ORAL_TABLET | Freq: Once | ORAL | Status: AC
  Administered 2023-03-26: 100 mg via ORAL
  Filled 2023-03-26: qty 1

## 2023-03-26 MED ORDER — DOXYCYCLINE HYCLATE 100 MG PO TABS
100.0000 mg | ORAL_TABLET | Freq: Two times a day (BID) | ORAL | 0 refills | Status: DC
  Filled 2023-03-26: qty 13, 7d supply, fill #0

## 2023-03-26 NOTE — Telephone Encounter (Signed)
Talked to patient, she stated she was at the hospital for this.

## 2023-03-26 NOTE — Discharge Instructions (Addendum)
The cause of the hand swelling is unclear at this time, it may be secondary to gout, which is an inflammatory process, versus a skin infection.  We are going to go ahead and treat you with both.  I prescribed you some naproxen, to treat your gout, and some doxycycline for possible cellulitis.  Please follow-up with your primary care doctor for further evaluation.  I have given you your first dose today, as your pharmacy may be closed when you get out of the emergency department.  Return to the ER if the hand becomes more swollen, red, tender to the touch, or you develop fever.

## 2023-03-26 NOTE — ED Provider Notes (Signed)
[place] EMERGENCY DEPARTMENT AT [facility] Provider Note   CSN: [id number] Arrival date & time: 03/26/23  1254     History  Chief Complaint  Patient presents with   hand swelling    Mary Anderson is a 57 y.o. female, history of diabetes, who presents to the ED secondary to severe right hand pain, has been going on for for the past day.  She states she was at work yesterday, when she noticed that she was having a little bit of hand pain, and it progressively got worse.  States it was swollen today, and very painful, and difficulty opening and closing fist, so she decided come in.  Denies any trauma to the area, fevers, chills.  No use of IV drugs.  No recent wounds.  No history of gout. Home Medications Prior to Admission medications   Medication Sig Start Date End Date Taking? Authorizing Provider  doxycycline (VIBRAMYCIN) 100 MG capsule Take 1 capsule (100 mg total) by mouth 2 (two) times daily. 03/26/23  Yes Lashae Wollenberg L, PA  naproxen (NAPROSYN) 500 MG tablet Take 1 tablet (500 mg total) by mouth 2 (two) times daily. 03/26/23  Yes Georganna Maxson L, PA  Accu-Chek Softclix Lancets lancets Use as directed up to four times daily 04/19/22   Merrilyn Puma, MD  acetaminophen (TYLENOL) 500 MG tablet Take 500-1,000 mg by mouth every 8 (eight) hours as needed for mild pain. Patient not taking: Reported on 03/05/2023    [provider]  Blood Glucose Monitoring Suppl (ACCU-CHEK GUIDE) w/Device KIT Use as directed up to four times daily 04/19/22   Merrilyn Puma, MD  carvedilol (COREG) 6.25 MG tablet Take 1 tablet (6.25 mg total) by mouth 2 (two) times daily with a meal. 03/05/23   [name], NP  folic acid (FOLVITE) 1 MG tablet TAKE 1 TABLET BY MOUTH EVERY DAY 10/26/22   [name], NP  furosemide (LASIX) 40 MG tablet Take 1 tablet (40 mg total) by mouth daily. 03/05/23   [name], NP  glucose blood (ACCU-CHEK GUIDE) test strip Use as directed up to four times  daily. 11/29/22   [name], NP  hydrALAZINE (APRESOLINE) 25 MG tablet Take 1 tablet (25 mg total) by mouth 2 (two) times daily. 03/05/23   [name], NP  ibuprofen (ADVIL,MOTRIN) 200 MG tablet Take 200-400 mg by mouth daily as needed (pain). Patient not taking: Reported on 03/05/2023    [provider]  Insulin Glargine (BASAGLAR KWIKPEN) 100 UNIT/ML Inject 15 Units into the skin daily. 03/05/23   [name], NP  Insulin Pen Needle 32G X 4 MM MISC Use once daily 03/05/23   [name], NP  promethazine-dextromethorphan (PROMETHAZINE-DM) 6.25-15 MG/5ML syrup Take 5 mLs by mouth 3 (three) times daily as needed for cough. Patient not taking: Reported on 10/08/2022 05/01/22   [name], NP  spironolactone (ALDACTONE) 100 MG tablet Take 1 tablet (100 mg total) by mouth daily. 03/05/23   [name], NP      Allergies    Patient has no known allergies.    Review of Systems   Review of Systems  Constitutional:  Negative for fever.  Musculoskeletal:  Positive for joint swelling.    Physical Exam Updated Vital Signs BP 132/70 (BP Location: Left Arm)   Pulse (!) 50   Temp 97.6 F (36.4 C) (Oral)   Resp 17   Ht 5' 4.25" (1.632 m)  Wt 76 kg   LMP 04/22/2011   SpO2 95%   BMI 28.54 kg/m  Physical Exam Vitals and nursing note reviewed.  Constitutional:      General: She is not in acute distress.    Appearance: She is well-developed.  HENT:     Head: Normocephalic and atraumatic.  Eyes:     General:        Right eye: No discharge.        Left eye: No discharge.     Conjunctiva/sclera: Conjunctivae normal.  Pulmonary:     Effort: No respiratory distress.  Musculoskeletal:     Comments: Right hand: TTP of fourth and fifth metacarpals with mild erythema and edema. Radial pulses present. Grip strength intact. Able to flex, extend, ulnar and radial deviate wrist. Two point discrimination intact. Normal thumb opposition. Intact passive ROM for all MCPs, PIPs,  and DIPs.  Difficulty with flexion of fourth and fifth MCPs secondary to pain.  No snuffbox ttp. No sensory deficits. Capillary refill <2sec.  No wrist, or arm tenderness to palpation.   Neurological:     Mental Status: She is alert.     Comments: Clear speech.   Psychiatric:        Behavior: Behavior normal.        Thought Content: Thought content normal.     ED Results / Procedures / Treatments   Labs (all labs ordered are listed, but only abnormal results are displayed) Labs Reviewed - No data to display  EKG None  Radiology DG Hand Complete Right  Result Date: 03/26/2023 CLINICAL DATA:  Acute right hand pain without known injury. EXAM: RIGHT HAND - COMPLETE 3+ VIEW COMPARISON:  None Available. FINDINGS: There is no evidence of fracture or dislocation. There is no evidence of arthropathy or other focal bone abnormality. Soft tissues are unremarkable. IMPRESSION: Negative. Electronically Signed   By: Lupita Raider M.D.   On: 03/26/2023 17:22    Procedures Procedures    Medications Ordered in ED Medications  naproxen (NAPROSYN) tablet 500 mg (has no administration in time range)  doxycycline (VIBRA-TABS) tablet 100 mg (has no administration in time range)  oxyCODONE-acetaminophen (PERCOCET/ROXICET) 5-325 MG per tablet 1 tablet (1 tablet Oral Given 03/26/23 1556)    ED Course/ Medical Decision Making/ A&P                                 Medical Decision Making Patient is a 57 year old female, here for hand swelling, this been going on for about a day.  No known trauma to it.  She has localized redness, swelling, tenderness.  She is on a diuretic, and diabetic.  Will obtain x-ray for further evaluation.  Is possibly cellulitic, as it is warm, hot, swollen, versus gout.  Amount and/or Complexity of Data Reviewed Radiology: ordered.    Details: X-ray unremarkable Discussion of management or test interpretation with external provider(s): Discussed with patient, as the cause  is unclear, and she is not able to see her primary care doctor for few weeks, we will treat her for both gout gout, and cellulitis.  Doxycycline ordered, as well as naproxen, as she is diabetic and we do not want to treat with prednisone in case her sugars will go up.  We discussed return precautions and she voiced understanding.  Risk Prescription drug management.  Final Clinical Impression(s) / ED Diagnoses Final diagnoses:  Localized swelling on right hand  Rx / DC Orders ED Discharge Orders          Ordered    doxycycline (VIBRAMYCIN) 100 MG capsule  2 times daily        03/26/23 1739    naproxen (NAPROSYN) 500 MG tablet  2 times daily        03/26/23 1739              Mickala Laton, [place], PA 03/26/23 1741    Benjiman Core, MD 03/27/23 1437

## 2023-03-26 NOTE — ED Triage Notes (Signed)
Pt states the right hand started hurting at work. Pt denies injury. Pt has 2+ swelling in right hand knuckle area. The area is erythematous.

## 2023-03-26 NOTE — Telephone Encounter (Signed)
Copied from CRM [id number]. Topic: General - Other >> Mar 26, 2023  9:51 AM Macon Large wrote: Reason for CRM: Pt wanted to make pcp aware that her hand is swollen near three of he knuckles and she is having trouble closing her hand. Pt stated she will go to another location to be evaluated since there are no appts available with the office today.

## 2023-03-26 NOTE — Telephone Encounter (Signed)
Report to Urgent Care/Emergency Department for immediate medical evaluation. Schedule follow-up appointment with Primary Care.

## 2023-03-27 ENCOUNTER — Other Ambulatory Visit: Payer: Self-pay

## 2023-03-28 ENCOUNTER — Other Ambulatory Visit: Payer: Self-pay

## 2023-04-03 ENCOUNTER — Other Ambulatory Visit: Payer: Self-pay | Admitting: Family

## 2023-04-03 ENCOUNTER — Other Ambulatory Visit: Payer: Self-pay

## 2023-04-03 DIAGNOSIS — E1165 Type 2 diabetes mellitus with hyperglycemia: Secondary | ICD-10-CM

## 2023-04-04 NOTE — Telephone Encounter (Signed)
Requested Prescriptions  Refused Prescriptions Disp Refills   Insulin Pen Needle (TECHLITE PLUS PEN NEEDLES) 32G X 4 MM MISC 100 each 0    Sig: use once daily     Endocrinology: Diabetes - Testing Supplies Passed - 04/03/2023  3:31 PM      Passed - Valid encounter within last 12 months    Recent Outpatient Visits           1 month ago Screening cholesterol level   [place] [facility], Amy J, NP   4 months ago Primary hypertension   [place] [facility], Amy J, NP   7 months ago Primary hypertension   [place] [facility], Amy J, NP   9 months ago Primary hypertension   [place] [facility], Amy J, NP   10 months ago Type 2 diabetes mellitus with hyperglycemia, with long-term current use of insulin [facility])   [place] [facility], [place], NP       Future Appointments             In 2 months [name], NP [facility]

## 2023-04-05 ENCOUNTER — Other Ambulatory Visit: Payer: Self-pay | Admitting: Family

## 2023-04-05 DIAGNOSIS — Z1211 Encounter for screening for malignant neoplasm of colon: Secondary | ICD-10-CM

## 2023-04-05 DIAGNOSIS — Z1212 Encounter for screening for malignant neoplasm of rectum: Secondary | ICD-10-CM

## 2023-04-08 ENCOUNTER — Other Ambulatory Visit: Payer: Self-pay

## 2023-04-09 ENCOUNTER — Other Ambulatory Visit: Payer: Self-pay

## 2023-04-10 ENCOUNTER — Other Ambulatory Visit: Payer: Self-pay

## 2023-04-12 ENCOUNTER — Other Ambulatory Visit: Payer: Self-pay

## 2023-04-12 ENCOUNTER — Inpatient Hospital Stay: Payer: Self-pay | Attending: Oncology

## 2023-04-12 ENCOUNTER — Inpatient Hospital Stay (HOSPITAL_BASED_OUTPATIENT_CLINIC_OR_DEPARTMENT_OTHER): Payer: Self-pay | Admitting: Oncology

## 2023-04-12 ENCOUNTER — Encounter: Payer: Self-pay | Admitting: Oncology

## 2023-04-12 VITALS — BP 123/79 | HR 56 | Temp 97.7°F | Resp 17 | Wt 169.5 lb

## 2023-04-12 DIAGNOSIS — K769 Liver disease, unspecified: Secondary | ICD-10-CM

## 2023-04-12 DIAGNOSIS — K7469 Other cirrhosis of liver: Secondary | ICD-10-CM | POA: Insufficient documentation

## 2023-04-12 LAB — CBC WITH DIFFERENTIAL/PLATELET
Abs Immature Granulocytes: 0 10*3/uL (ref 0.00–0.07)
Basophils Absolute: 0 10*3/uL (ref 0.0–0.1)
Basophils Relative: 0 %
Eosinophils Absolute: 0 10*3/uL (ref 0.0–0.5)
Eosinophils Relative: 2 %
HCT: 33.6 % — ABNORMAL LOW (ref 36.0–46.0)
Hemoglobin: 12 g/dL (ref 12.0–15.0)
Immature Granulocytes: 0 %
Lymphocytes Relative: 30 %
Lymphs Abs: 0.7 10*3/uL (ref 0.7–4.0)
MCH: 36 pg — ABNORMAL HIGH (ref 26.0–34.0)
MCHC: 35.7 g/dL (ref 30.0–36.0)
MCV: 100.9 fL — ABNORMAL HIGH (ref 80.0–100.0)
Monocytes Absolute: 0.2 10*3/uL (ref 0.1–1.0)
Monocytes Relative: 9 %
Neutro Abs: 1.4 10*3/uL — ABNORMAL LOW (ref 1.7–7.7)
Neutrophils Relative %: 59 %
Platelets: 51 10*3/uL — ABNORMAL LOW (ref 150–400)
RBC: 3.33 MIL/uL — ABNORMAL LOW (ref 3.87–5.11)
RDW: 13.2 % (ref 11.5–15.5)
WBC: 2.4 10*3/uL — ABNORMAL LOW (ref 4.0–10.5)
nRBC: 0 % (ref 0.0–0.2)

## 2023-04-12 LAB — COMPREHENSIVE METABOLIC PANEL
ALT: 130 U/L — ABNORMAL HIGH (ref 0–44)
AST: 155 U/L — ABNORMAL HIGH (ref 15–41)
Albumin: 3 g/dL — ABNORMAL LOW (ref 3.5–5.0)
Alkaline Phosphatase: 87 U/L (ref 38–126)
Anion gap: 4 — ABNORMAL LOW (ref 5–15)
BUN: 18 mg/dL (ref 6–20)
CO2: 25 mmol/L (ref 22–32)
Calcium: 8.8 mg/dL — ABNORMAL LOW (ref 8.9–10.3)
Chloride: 104 mmol/L (ref 98–111)
Creatinine, Ser: 0.82 mg/dL (ref 0.44–1.00)
GFR, Estimated: >60 mL/min (ref >60)
Glucose, Bld: 245 mg/dL — ABNORMAL HIGH (ref 70–99)
Potassium: 3.7 mmol/L (ref 3.5–5.1)
Sodium: 133 mmol/L — ABNORMAL LOW (ref 135–145)
Total Bilirubin: 2.1 mg/dL — ABNORMAL HIGH (ref 0.3–1.2)
Total Protein: 6.1 g/dL — ABNORMAL LOW (ref 6.5–8.1)

## 2023-04-13 LAB — AFP TUMOR MARKER: AFP, Serum, Tumor Marker: 80.5 ng/mL — ABNORMAL HIGH (ref 0.0–9.2)

## 2023-04-13 NOTE — Progress Notes (Signed)
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 She has been getting HCC surveillance by Dr. Servando Snare. She had an MRI liver with and without contrast in September 2023 and was found to have a 16mm focus of subcapsular enhancement in the posterior right liver with no definitive characteristics of washout. This may reflect transient hepatic intensity difference potentially related to underlying vascular malformation. However her AFP was found to be elevated at 18 and therefore patient has been referred for further management.     Interval history- patient reports chronic fatigue. No new complaints today. No recent hospitalizations. Denies any blood loss in her stool or urine  ECOG PS- 1 Pain scale- 0   Review of systems- Review of Systems  Constitutional:  Positive for malaise/fatigue. Negative for chills, fever and weight loss.  HENT:  Negative for congestion, ear discharge and nosebleeds.   Eyes:  Negative for blurred vision.  Respiratory:  Negative for cough, hemoptysis, sputum production, shortness of breath and wheezing.   Cardiovascular:  Negative for chest pain, palpitations, orthopnea and claudication.  Gastrointestinal:  Negative for abdominal pain, blood in stool, constipation, diarrhea, heartburn, melena, nausea and vomiting.  Genitourinary:  Negative for dysuria, flank pain, frequency, hematuria and urgency.  Musculoskeletal:   Negative for back pain, joint pain and myalgias.  Skin:  Negative for rash.  Neurological:  Negative for dizziness, tingling, focal weakness, seizures, weakness and headaches.  Endo/Heme/Allergies:  Does not bruise/bleed easily.  Psychiatric/Behavioral:  Negative for depression and suicidal ideas. The patient does not have insomnia.       No Known Allergies   Past Medical History:  Diagnosis Date   Hypertension      Past Surgical History:  Procedure Laterality Date   ESOPHAGOGASTRODUODENOSCOPY (EGD) WITH PROPOFOL N/A 01/30/2022   Procedure: ESOPHAGOGASTRODUODENOSCOPY (EGD) WITH PROPOFOL;  Surgeon: Midge Minium, MD;  Location: [facility] ENDOSCOPY;  Service: Endoscopy;  Laterality: N/A;   IR THORACENTESIS ASP PLEURAL SPACE W/IMG GUIDE  10/27/2021    Social History   Socioeconomic History   Marital status: Married    Spouse name: Not on file   Number of children: Not on file   Years of education: Not on file   Highest education level: 11th grade  Occupational History   Not on file  Tobacco Use   Smoking status: Never    Passive exposure: Never   Smokeless tobacco: Never  Vaping Use   Vaping status: Never Used  Substance and Sexual Activity   Alcohol use: Not Currently    Comment: no alcohol the past 6 months.   Drug use: No   Sexual activity: Not on file  Other Topics Concern   Not on file  Social History Narrative   Not on file   Social Determinants of Health   Financial Resource Strain: Medium Risk (11/28/2022)   Overall Financial Resource Strain (CARDIA)    Difficulty of Paying Living Expenses: Somewhat hard  Food Insecurity: No Food Insecurity (11/28/2022)   Hunger Vital Sign  Worried About Programme researcher, broadcasting/film/video in the Last Year: Never true    Ran Out of Food in the Last Year: Never true  Transportation Needs: No Transportation Needs (11/28/2022)   PRAPARE - Administrator, Civil Service (Medical): No    Lack of Transportation (Non-Medical): No   Physical Activity: Insufficiently Active (11/28/2022)   Exercise Vital Sign    Days of Exercise per Week: 3 days    Minutes of Exercise per Session: 20 min  Stress: No Stress Concern Present (11/28/2022)   [employer] of Occupational Health - Occupational Stress Questionnaire    Feeling of Stress : Not at all  Social Connections: Moderately Integrated (11/28/2022)   Social Connection and Isolation Panel [NHANES]    Frequency of Communication with Friends and Family: More than three times a week    Frequency of Social Gatherings with Friends and Family: Once a week    Attends Religious Services: 1 to 4 times per year    Active Member of [employer] or Organizations: No    Attends Engineer, structural: Not on file    Marital Status: Married  Catering manager Violence: Not At Risk (04/19/2022)   Humiliation, Afraid, Rape, and Kick questionnaire    Fear of Current or Ex-Partner: No    Emotionally Abused: No    Physically Abused: No    Sexually Abused: No    Family History  Problem Relation Age of Onset   Hypertension Mother    Colon cancer Mother    Hypertension Brother    Cancer Maternal Uncle    Cancer Maternal Uncle    Clotting disorder Neg Hx    Diabetes Neg Hx      Current Outpatient Medications:    Accu-Chek Softclix Lancets lancets, Use as directed up to four times daily, Disp: 200 each, Rfl: 0   Blood Glucose Monitoring Suppl (ACCU-CHEK GUIDE) w/Device KIT, Use as directed up to four times daily, Disp: 1 kit, Rfl: 0   carvedilol (COREG) 6.25 MG tablet, Take 1 tablet (6.25 mg total) by mouth 2 (two) times daily with a meal., Disp: 180 tablet, Rfl: 0   doxycycline (VIBRA-TABS) 100 MG tablet, Take 1 tablet (100 mg total) by mouth 2 (two) times daily., Disp: 13 tablet, Rfl: 0   folic acid (FOLVITE) 1 MG tablet, TAKE 1 TABLET BY MOUTH EVERY DAY, Disp: 90 tablet, Rfl: 3   furosemide (LASIX) 40 MG tablet, Take 1 tablet (40 mg total) by mouth daily., Disp: 90 tablet, Rfl:  0   glucose blood (ACCU-CHEK GUIDE) test strip, Use as directed up to four times daily., Disp: 100 each, Rfl: 0   hydrALAZINE (APRESOLINE) 25 MG tablet, Take 1 tablet (25 mg total) by mouth 2 (two) times daily., Disp: 180 tablet, Rfl: 0   Insulin Glargine (BASAGLAR KWIKPEN) 100 UNIT/ML, Inject 15 Units into the skin daily., Disp: 15 mL, Rfl: 0   Insulin Pen Needle 32G X 4 MM MISC, Use once daily, Disp: 100 each, Rfl: 0   naproxen (NAPROSYN) 500 MG tablet, Take 1 tablet (500 mg total) by mouth 2 (two) times daily., Disp: 14 tablet, Rfl: 0   spironolactone (ALDACTONE) 100 MG tablet, Take 1 tablet (100 mg total) by mouth daily., Disp: 90 tablet, Rfl: 0   acetaminophen (TYLENOL) 500 MG tablet, Take 500-1,000 mg by mouth every 8 (eight) hours as needed for mild pain. (Patient not taking: Reported on 03/05/2023), Disp: , Rfl:    ibuprofen (ADVIL,MOTRIN) 200 MG tablet,  Take 200-400 mg by mouth daily as needed (pain). (Patient not taking: Reported on 03/05/2023), Disp: , Rfl:    promethazine-dextromethorphan (PROMETHAZINE-DM) 6.25-15 MG/5ML syrup, Take 5 mLs by mouth 3 (three) times daily as needed for cough. (Patient not taking: Reported on 10/08/2022), Disp: 240 mL, Rfl: 0  Physical exam:  Vitals:   04/12/23 1134  BP: 123/79  Pulse: (!) 56  Resp: 17  Temp: 97.7 F (36.5 C)  TempSrc: Tympanic  SpO2: 99%  Weight: 169 lb 8 oz (76.9 kg)   Physical Exam Cardiovascular:     Rate and Rhythm: Normal rate and regular rhythm.     Heart sounds: Normal heart sounds.  Pulmonary:     Effort: Pulmonary effort is normal.     Breath sounds: Normal breath sounds.  Abdominal:     General: Bowel sounds are normal.     Palpations: Abdomen is soft.     Comments: Palpable splenomegaly  Skin:    General: Skin is warm and dry.  Neurological:     Mental Status: She is alert and oriented to person, place, and time.         Latest Ref Rng & Units 04/12/2023   10:59 AM  CMP  Glucose 70 - 99 mg/dL 161   BUN 6 -  20 mg/dL 18   Creatinine 0.96 - 1.00 mg/dL 0.45   Sodium 409 - 811 mmol/L 133   Potassium 3.5 - 5.1 mmol/L 3.7   Chloride 98 - 111 mmol/L 104   CO2 22 - 32 mmol/L 25   Calcium 8.9 - 10.3 mg/dL 8.8   Total Protein 6.5 - 8.1 g/dL 6.1   Total Bilirubin 0.3 - 1.2 mg/dL 2.1   Alkaline Phos 38 - 126 U/L 87   AST 15 - 41 U/L 155   ALT 0 - 44 U/L 130       Latest Ref Rng & Units 04/12/2023   10:59 AM  CBC  WBC 4.0 - 10.5 K/uL 2.4   Hemoglobin 12.0 - 15.0 g/dL 91.4   Hematocrit 78.2 - 46.0 % 33.6   Platelets 150 - 400 K/uL 51     No images are attached to the encounter.  DG Hand Complete Right  Result Date: 03/26/2023 CLINICAL DATA:  Acute right hand pain without known injury. EXAM: RIGHT HAND - COMPLETE 3+ VIEW COMPARISON:  None Available. FINDINGS: There is no evidence of fracture or dislocation. There is no evidence of arthropathy or other focal bone abnormality. Soft tissues are unremarkable. IMPRESSION: Negative. Electronically Signed   By: Lupita Raider M.D.   On: 03/26/2023 17:22     Assessment and plan- Patient is a 57 y.o. female with h/o liver cirrhosis here for screening visit for [facility]  Patient has not followed up with DR. Wohl from GI and she needs to re establish follow up for her cirrhosis with him. She was referred to me in the past for concern of liver lesion which was not seen on MRI. She is overdue to get her usg liver. There was a potential vascular malformation found on mri in April 2024. After patiet left the clinic AFP has increased to 80 from 12. I will therefore plan to repeat mri abdomen at this time and see her thereafter   Visit Diagnosis 1. Other cirrhosis of liver (HCC)   2. Liver lesion      Dr. Owens Shark, MD, MPH [facility] [phone] 04/13/2023 5:29 PM

## 2023-04-15 ENCOUNTER — Other Ambulatory Visit: Payer: Self-pay

## 2023-04-15 DIAGNOSIS — K7469 Other cirrhosis of liver: Secondary | ICD-10-CM

## 2023-04-25 ENCOUNTER — Ambulatory Visit
Admission: RE | Admit: 2023-04-25 | Discharge: 2023-04-25 | Disposition: A | Discharge status: Home / Self Care | Payer: Self-pay | Source: Ambulatory Visit | Attending: Oncology | Admitting: Oncology

## 2023-04-25 ENCOUNTER — Other Ambulatory Visit: Payer: Self-pay

## 2023-04-25 ENCOUNTER — Ambulatory Visit: Payer: Self-pay

## 2023-04-25 DIAGNOSIS — K7469 Other cirrhosis of liver: Secondary | ICD-10-CM

## 2023-04-25 MED ORDER — GADOBUTROL 1 MMOL/ML IV SOLN
7.0000 mL | Freq: Once | INTRAVENOUS | Status: AC | PRN
  Administered 2023-04-25: 7 mL via INTRAVENOUS

## 2023-05-01 ENCOUNTER — Other Ambulatory Visit: Payer: Self-pay | Admitting: Oncology

## 2023-05-09 ENCOUNTER — Other Ambulatory Visit: Payer: Self-pay

## 2023-05-09 NOTE — Progress Notes (Signed)
Tumor board 

## 2023-05-21 ENCOUNTER — Inpatient Hospital Stay: Payer: Self-pay | Attending: Oncology | Admitting: Oncology

## 2023-05-21 ENCOUNTER — Encounter: Payer: Self-pay | Admitting: Oncology

## 2023-05-21 VITALS — BP 121/74 | HR 60 | Temp 98.1°F | Resp 18 | Wt 165.6 lb

## 2023-05-21 DIAGNOSIS — D6959 Other secondary thrombocytopenia: Secondary | ICD-10-CM | POA: Insufficient documentation

## 2023-05-21 DIAGNOSIS — K766 Portal hypertension: Secondary | ICD-10-CM | POA: Insufficient documentation

## 2023-05-21 DIAGNOSIS — K769 Liver disease, unspecified: Secondary | ICD-10-CM | POA: Insufficient documentation

## 2023-05-21 DIAGNOSIS — R772 Abnormality of alphafetoprotein: Secondary | ICD-10-CM | POA: Insufficient documentation

## 2023-05-21 DIAGNOSIS — K7469 Other cirrhosis of liver: Secondary | ICD-10-CM | POA: Insufficient documentation

## 2023-05-23 ENCOUNTER — Telehealth: Payer: Self-pay

## 2023-05-23 NOTE — Telephone Encounter (Signed)
Appt set with Dr. Tobi Bastos for 06/17/2023

## 2023-05-23 NOTE — Telephone Encounter (Addendum)
It shows that the referral is for K74.69 (ICD-10-CM) - Other cirrhosis of liver. It is not for screening for colon cancer, colonoscopy.  Deshana attempt to contact patient. Please call patient to schedule appointment.

## 2023-05-23 NOTE — Telephone Encounter (Signed)
Pt requesting call back to schedule colonoscopy.

## 2023-05-26 NOTE — Progress Notes (Signed)
Hematology/Oncology Consult note [facility]  Telephone:([phone] [phone]  Patient Care Team: [name], NP as PCP - General (Nurse Practitioner) Creig Hines, MD as Consulting Physician (Oncology)   Name of the patient: Mary Anderson  [id number]  February 14, 1966   Date of visit: 05/26/23  Diagnosis-  h/o liver cirrhosis and screening for [facility]   Chief complaint/ Reason for visit- discuss mri results and further management  Heme/Onc history: Patient is a 57 year old female with a past medical history significant for 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 She has been getting HCC surveillance by Dr. Servando Snare. She had an MRI liver with and without contrast in September 2023 and was found to have a 16mm focus of subcapsular enhancement in the posterior right liver with no definitive characteristics of washout. This may reflect transient hepatic intensity difference potentially related to underlying vascular malformation. However her AFP was found to be elevated at 18 and therefore patient has been referred for further management.   Interval history- no acute issues since last visit.   ECOG PS- 1 Pain scale- 0   Review of systems- Review of Systems  Constitutional:  Negative for chills, fever, malaise/fatigue and weight loss.  HENT:  Negative for congestion, ear discharge and nosebleeds.   Eyes:  Negative for blurred vision.  Respiratory:  Negative for cough, hemoptysis, sputum production, shortness of breath and wheezing.   Cardiovascular:  Negative for chest pain, palpitations, orthopnea and claudication.  Gastrointestinal:  Negative for abdominal pain, blood in stool, constipation, diarrhea, heartburn, melena, nausea and vomiting.  Genitourinary:  Negative for dysuria, flank pain, frequency, hematuria and urgency.  Musculoskeletal:  Negative for back pain, joint pain and myalgias.  Skin:  Negative for rash.  Neurological:   Negative for dizziness, tingling, focal weakness, seizures, weakness and headaches.  Endo/Heme/Allergies:  Does not bruise/bleed easily.  Psychiatric/Behavioral:  Negative for depression and suicidal ideas. The patient does not have insomnia.       No Known Allergies   Past Medical History:  Diagnosis Date   Hypertension      Past Surgical History:  Procedure Laterality Date   ESOPHAGOGASTRODUODENOSCOPY (EGD) WITH PROPOFOL N/A 01/30/2022   Procedure: ESOPHAGOGASTRODUODENOSCOPY (EGD) WITH PROPOFOL;  Surgeon: Midge Minium, MD;  Location: [facility] ENDOSCOPY;  Service: Endoscopy;  Laterality: N/A;   IR THORACENTESIS ASP PLEURAL SPACE W/IMG GUIDE  10/27/2021    Social History   Socioeconomic History   Marital status: Married    Spouse name: Not on file   Number of children: Not on file   Years of education: Not on file   Highest education level: 11th grade  Occupational History   Not on file  Tobacco Use   Smoking status: Never    Passive exposure: Never   Smokeless tobacco: Never  Vaping Use   Vaping status: Never Used  Substance and Sexual Activity   Alcohol use: Not Currently    Comment: no alcohol the past 6 months.   Drug use: No   Sexual activity: Not on file  Other Topics Concern   Not on file  Social History Narrative   Not on file   Social Determinants of Health   Financial Resource Strain: Medium Risk (11/28/2022)   Overall Financial Resource Strain (CARDIA)    Difficulty of Paying Living Expenses: Somewhat hard  Food Insecurity: No Food Insecurity (11/28/2022)   Hunger Vital Sign    Worried About Running Out of Food in the Last Year: Never  true    Ran Out of Food in the Last Year: Never true  Transportation Needs: No Transportation Needs (11/28/2022)   PRAPARE - Administrator, Civil Service (Medical): No    Lack of Transportation (Non-Medical): No  Physical Activity: Insufficiently Active (11/28/2022)   Exercise Vital Sign    Days of Exercise per  Week: 3 days    Minutes of Exercise per Session: 20 min  Stress: No Stress Concern Present (11/28/2022)   [employer] of Occupational Health - Occupational Stress Questionnaire    Feeling of Stress : Not at all  Social Connections: Moderately Integrated (11/28/2022)   Social Connection and Isolation Panel [NHANES]    Frequency of Communication with Friends and Family: More than three times a week    Frequency of Social Gatherings with Friends and Family: Once a week    Attends Religious Services: 1 to 4 times per year    Active Member of [employer] or Organizations: No    Attends Engineer, structural: Not on file    Marital Status: Married  Catering manager Violence: Not At Risk (04/19/2022)   Humiliation, Afraid, Rape, and Kick questionnaire    Fear of Current or Ex-Partner: No    Emotionally Abused: No    Physically Abused: No    Sexually Abused: No    Family History  Problem Relation Age of Onset   Hypertension Mother    Colon cancer Mother    Hypertension Brother    Cancer Maternal Uncle    Cancer Maternal Uncle    Clotting disorder Neg Hx    Diabetes Neg Hx      Current Outpatient Medications:    melatonin 5 MG TABS, Take 5 mg by mouth., Disp: , Rfl:    Accu-Chek Softclix Lancets lancets, Use as directed up to four times daily, Disp: 200 each, Rfl: 0   acetaminophen (TYLENOL) 500 MG tablet, Take 500-1,000 mg by mouth every 8 (eight) hours as needed for mild pain. (Patient not taking: Reported on 03/05/2023), Disp: , Rfl:    Blood Glucose Monitoring Suppl (ACCU-CHEK GUIDE) w/Device KIT, Use as directed up to four times daily, Disp: 1 kit, Rfl: 0   carvedilol (COREG) 6.25 MG tablet, Take 1 tablet (6.25 mg total) by mouth 2 (two) times daily with a meal., Disp: 180 tablet, Rfl: 0   doxycycline (VIBRA-TABS) 100 MG tablet, Take 1 tablet (100 mg total) by mouth 2 (two) times daily., Disp: 13 tablet, Rfl: 0   folic acid (FOLVITE) 1 MG tablet, TAKE 1 TABLET BY MOUTH EVERY  DAY, Disp: 90 tablet, Rfl: 3   furosemide (LASIX) 40 MG tablet, Take 1 tablet (40 mg total) by mouth daily., Disp: 90 tablet, Rfl: 0   glucose blood (ACCU-CHEK GUIDE) test strip, Use as directed up to four times daily., Disp: 100 each, Rfl: 0   hydrALAZINE (APRESOLINE) 25 MG tablet, Take 1 tablet (25 mg total) by mouth 2 (two) times daily., Disp: 180 tablet, Rfl: 0   ibuprofen (ADVIL,MOTRIN) 200 MG tablet, Take 200-400 mg by mouth daily as needed (pain). (Patient not taking: Reported on 03/05/2023), Disp: , Rfl:    Insulin Glargine (BASAGLAR KWIKPEN) 100 UNIT/ML, Inject 15 Units into the skin daily., Disp: 15 mL, Rfl: 0   Insulin Pen Needle 32G X 4 MM MISC, Use once daily, Disp: 100 each, Rfl: 0   naproxen (NAPROSYN) 500 MG tablet, Take 1 tablet (500 mg total) by mouth 2 (two) times daily., Disp: 14 tablet,  Rfl: 0   promethazine-dextromethorphan (PROMETHAZINE-DM) 6.25-15 MG/5ML syrup, Take 5 mLs by mouth 3 (three) times daily as needed for cough. (Patient not taking: Reported on 10/08/2022), Disp: 240 mL, Rfl: 0   spironolactone (ALDACTONE) 100 MG tablet, Take 1 tablet (100 mg total) by mouth daily., Disp: 90 tablet, Rfl: 0  Physical exam:  Vitals:   05/21/23 1402  BP: 121/74  Pulse: 60  Resp: 18  Temp: 98.1 F (36.7 C)  TempSrc: Tympanic  SpO2: 98%  Weight: 165 lb 9.6 oz (75.1 kg)   Physical Exam Cardiovascular:     Rate and Rhythm: Normal rate and regular rhythm.     Heart sounds: Normal heart sounds.  Pulmonary:     Effort: Pulmonary effort is normal.     Breath sounds: Normal breath sounds.  Abdominal:     General: Bowel sounds are normal.     Palpations: Abdomen is soft.  Skin:    General: Skin is warm and dry.  Neurological:     Mental Status: She is alert and oriented to person, place, and time.         Latest Ref Rng & Units 04/12/2023   10:59 AM  CMP  Glucose 70 - 99 mg/dL 161   BUN 6 - 20 mg/dL 18   Creatinine 0.96 - 1.00 mg/dL 0.45   Sodium 409 - 811 mmol/L 133    Potassium 3.5 - 5.1 mmol/L 3.7   Chloride 98 - 111 mmol/L 104   CO2 22 - 32 mmol/L 25   Calcium 8.9 - 10.3 mg/dL 8.8   Total Protein 6.5 - 8.1 g/dL 6.1   Total Bilirubin 0.3 - 1.2 mg/dL 2.1   Alkaline Phos 38 - 126 U/L 87   AST 15 - 41 U/L 155   ALT 0 - 44 U/L 130       Latest Ref Rng & Units 04/12/2023   10:59 AM  CBC  WBC 4.0 - 10.5 K/uL 2.4   Hemoglobin 12.0 - 15.0 g/dL 91.4   Hematocrit 78.2 - 46.0 % 33.6   Platelets 150 - 400 K/uL 51      Assessment and plan- Patient is a 57 y.o. female h/o liver cirrhosis here to discuss mri results and further management  Patient has leukopenia and thrombocytopenia secondary to cirrhosis which is overall stable AFP has increased over the last 7 months from 12.9 to 80.5 therefore ordered MRI instead of ultrasound for [facility] surveillance.  MRI images were reviewed at tumor board and presently there is no concern for abnormal liver lesions that would be suggestive of HCC even if the AFP is high.  I will continue to monitor with a repeat MRI sometime in April 2024 and see her thereafter with CBC CMP and AFP   Visit Diagnosis 1. Liver lesion   2. Elevated AFP   3. Other cirrhosis of liver (HCC)      Dr. Owens Shark, MD, MPH [facility] at [facility] [phone] 05/26/2023 6:47 PM

## 2023-06-04 ENCOUNTER — Ambulatory Visit (INDEPENDENT_AMBULATORY_CARE_PROVIDER_SITE_OTHER): Payer: Self-pay | Admitting: Family

## 2023-06-04 ENCOUNTER — Other Ambulatory Visit: Payer: Self-pay

## 2023-06-04 ENCOUNTER — Encounter: Payer: Self-pay | Admitting: Family

## 2023-06-04 VITALS — BP 141/76 | HR 60 | Temp 97.7°F | Ht 64.0 in | Wt 175.0 lb

## 2023-06-04 DIAGNOSIS — M79671 Pain in right foot: Secondary | ICD-10-CM

## 2023-06-04 DIAGNOSIS — Z794 Long term (current) use of insulin: Secondary | ICD-10-CM

## 2023-06-04 DIAGNOSIS — E119 Type 2 diabetes mellitus without complications: Secondary | ICD-10-CM

## 2023-06-04 DIAGNOSIS — I10 Essential (primary) hypertension: Secondary | ICD-10-CM

## 2023-06-04 DIAGNOSIS — E1165 Type 2 diabetes mellitus with hyperglycemia: Secondary | ICD-10-CM

## 2023-06-04 DIAGNOSIS — Z0100 Encounter for examination of eyes and vision without abnormal findings: Secondary | ICD-10-CM

## 2023-06-04 LAB — POCT GLYCOSYLATED HEMOGLOBIN (HGB A1C): HbA1c, POC (controlled diabetic range): 7.6 % — AB (ref 0.0–7.0)

## 2023-06-04 MED ORDER — SPIRONOLACTONE 100 MG PO TABS
100.0000 mg | ORAL_TABLET | Freq: Every day | ORAL | 0 refills | Status: DC
  Filled 2023-06-04 – 2023-06-27 (×2): qty 90, 90d supply, fill #0

## 2023-06-04 MED ORDER — HYDRALAZINE HCL 25 MG PO TABS
25.0000 mg | ORAL_TABLET | Freq: Two times a day (BID) | ORAL | 0 refills | Status: DC
  Filled 2023-06-04 – 2023-06-27 (×2): qty 180, 90d supply, fill #0

## 2023-06-04 MED ORDER — FUROSEMIDE 40 MG PO TABS
40.0000 mg | ORAL_TABLET | Freq: Every day | ORAL | 0 refills | Status: DC
  Filled 2023-06-04 – 2023-06-27 (×2): qty 90, 90d supply, fill #0

## 2023-06-04 MED ORDER — INSULIN PEN NEEDLE 32G X 4 MM MISC
1.0000 | Freq: Every day | 0 refills | Status: DC
  Filled 2023-06-04: qty 100, 100d supply, fill #0

## 2023-06-04 MED ORDER — BASAGLAR KWIKPEN 100 UNIT/ML ~~LOC~~ SOPN
15.0000 [IU] | PEN_INJECTOR | Freq: Every day | SUBCUTANEOUS | 0 refills | Status: DC
  Filled 2023-06-04: qty 15, 100d supply, fill #0

## 2023-06-04 MED ORDER — GABAPENTIN 300 MG PO CAPS
300.0000 mg | ORAL_CAPSULE | Freq: Every day | ORAL | 1 refills | Status: DC
Start: 2023-06-04 — End: 2023-08-01
  Filled 2023-06-04: qty 30, 30d supply, fill #0
  Filled 2023-06-29 – 2023-07-01 (×2): qty 30, 30d supply, fill #1

## 2023-06-04 MED ORDER — CARVEDILOL 6.25 MG PO TABS
6.2500 mg | ORAL_TABLET | Freq: Two times a day (BID) | ORAL | 0 refills | Status: DC
  Filled 2023-06-04 – 2023-06-27 (×2): qty 180, 90d supply, fill #0

## 2023-06-04 NOTE — Progress Notes (Signed)
Patient ID: EMILYMARIE DEILY, female    DOB: 06/11/1966  MRN: [id number]  CC: Chronic Conditions Follow-Up  Subjective: Kadison Nishimura is a 57 y.o. female who presents for chronic conditions follow-up.   Her concerns today include:  - Doing well on Carvedilol, Furosemide, Hydralazine, and Spironolactone, no issues/concerns. She does not complain of red flag symptoms such as but not limited to chest pain, shortness of breath, worst headache of life, nausea/vomiting.  - Doing well on Insulin Glargine, no issues/concerns. Denies red flag symptoms associated with diabetes.  - Right foot pain radiating to right leg/right buttocks. Denies recent trauma/injury and red flag symptoms. Reports she has been wearing steel toe shoes at her job for years and may be contributing.   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     Blood Glucose Monitoring Suppl (ACCU-CHEK GUIDE) w/Device KIT Use as directed up to four times daily 1 kit 0   doxycycline (VIBRA-TABS) 100 MG tablet Take 1 tablet (100 mg total) by mouth 2  (two) times daily. 13 tablet 0   folic acid (FOLVITE) 1 MG tablet TAKE 1 TABLET BY MOUTH EVERY DAY 90 tablet 3   glucose blood (ACCU-CHEK GUIDE) test strip Use as directed up to four times daily. 100 each 0   ibuprofen (ADVIL,MOTRIN) 200 MG tablet Take 200-400 mg by mouth daily as needed (pain).     melatonin 5 MG TABS Take 5 mg by mouth.     naproxen (NAPROSYN) 500 MG tablet Take 1 tablet (500 mg total) by mouth 2 (two) times daily. 14 tablet 0   promethazine-dextromethorphan (PROMETHAZINE-DM) 6.25-15 MG/5ML syrup Take 5 mLs by mouth 3 (three) times daily as needed for cough. 240 mL 0   No current facility-administered medications on file prior to visit.    No Known Allergies  Social History   Socioeconomic History   Marital status: Married    Spouse name: Not on file   Number of children: Not on file   Years of education: Not on file   Highest education level: 11th grade  Occupational History   Not on file  Tobacco Use   Smoking status: Never    Passive exposure: Never   Smokeless tobacco: Never  Vaping Use   Vaping status: Never Used  Substance and Sexual Activity   Alcohol use: Not Currently    Comment: no alcohol the past 6 months.   Drug use: No   Sexual activity: Not on file  Other Topics Concern   Not on file  Social History Narrative   Not on file   Social Determinants of  Health   Financial Resource Strain: Low Risk  (06/03/2023)   Overall Financial Resource Strain (CARDIA)    Difficulty of Paying Living Expenses: Not hard at all  Food Insecurity: No Food Insecurity (06/03/2023)   Hunger Vital Sign    Worried About Running Out of Food in the Last Year: Never true    Ran Out of Food in the Last Year: Never true  Transportation Needs: No Transportation Needs (06/03/2023)   PRAPARE - Administrator, Civil Service (Medical): No    Lack of Transportation (Non-Medical): No  Physical Activity: Sufficiently Active (06/03/2023)   Exercise Vital Sign     Days of Exercise per Week: 5 days    Minutes of Exercise per Session: 40 min  Stress: No Stress Concern Present (06/03/2023)   [employer] of Occupational Health - Occupational Stress Questionnaire    Feeling of Stress : Only a little  Social Connections: Moderately Isolated (06/03/2023)   Social Connection and Isolation Panel [NHANES]    Frequency of Communication with Friends and Family: More than three times a week    Frequency of Social Gatherings with Friends and Family: More than three times a week    Attends Religious Services: Never    Database administrator or Organizations: No    Attends Engineer, structural: Not on file    Marital Status: Married  Catering manager Violence: Not At Risk (04/19/2022)   Humiliation, Afraid, Rape, and Kick questionnaire    Fear of Current or Ex-Partner: No    Emotionally Abused: No    Physically Abused: No    Sexually Abused: No    Family History  Problem Relation Age of Onset   Hypertension Mother    Colon cancer Mother    Hypertension Brother    Cancer Maternal Uncle    Cancer Maternal Uncle    Clotting disorder Neg Hx    Diabetes Neg Hx     Past Surgical History:  Procedure Laterality Date   ESOPHAGOGASTRODUODENOSCOPY (EGD) WITH PROPOFOL N/A 01/30/2022   Procedure: ESOPHAGOGASTRODUODENOSCOPY (EGD) WITH PROPOFOL;  Surgeon: Midge Minium, MD;  Location: [facility] ENDOSCOPY;  Service: Endoscopy;  Laterality: N/A;   IR THORACENTESIS ASP PLEURAL SPACE W/IMG GUIDE  10/27/2021    ROS: Review of Systems Negative except as stated above  PHYSICAL EXAM: BP (!) 141/76   Pulse 60   Temp 97.7 F (36.5 C) (Oral)   Ht 5\' 4"  (1.626 m)   Wt 175 lb (79.4 kg)   LMP 04/22/2011   SpO2 97%   BMI 30.04 kg/m   Physical Exam HENT:     Head: Normocephalic and atraumatic.     Nose: Nose normal.     Mouth/Throat:     Mouth: Mucous membranes are moist.     Pharynx: Oropharynx is clear.  Eyes:     Extraocular Movements: Extraocular  movements intact.     Conjunctiva/sclera: Conjunctivae normal.     Pupils: Pupils are equal, round, and reactive to light.  Cardiovascular:     Rate and Rhythm: Normal rate and regular rhythm.     Pulses: Normal pulses.     Heart sounds: Normal heart sounds.  Pulmonary:     Effort: Pulmonary effort is normal.     Breath sounds: Normal breath sounds.  Musculoskeletal:        General: Normal range of motion.     Right shoulder: Normal.     Left shoulder: Normal.     Right upper arm:  Normal.     Left upper arm: Normal.     Right elbow: Normal.     Left elbow: Normal.     Right forearm: Normal.     Left forearm: Normal.     Right wrist: Normal.     Left wrist: Normal.     Right hand: Normal.     Left hand: Normal.     Cervical back: Normal, normal range of motion and neck supple.     Thoracic back: Normal.     Lumbar back: Normal.     Right hip: Normal.     Left hip: Normal.     Right upper leg: Normal.     Left upper leg: Normal.     Right knee: Normal.     Left knee: Normal.     Right lower leg: Normal.     Left lower leg: Normal.     Right ankle: Normal.     Left ankle: Normal.     Right foot: Tenderness present.     Left foot: Normal.  Neurological:     General: No focal deficit present.     Mental Status: She is alert and oriented to person, place, and time.  Psychiatric:        Mood and Affect: Mood normal.        Behavior: Behavior normal.    ASSESSMENT AND PLAN: 1. Primary hypertension - Blood pressure not at goal during today's visit. May be secondary to foot pain (see #5). Patient asymptomatic without chest pressure, chest pain, palpitations, shortness of breath, worst headache of life, and any additional red flag symptoms. - Continue Carvedilol, Furosemide, Hydralazine, and Spironolactone as prescribed.  - Counseled on blood pressure goal of less than 130/80, low-sodium, DASH diet, medication compliance, and 150 minutes of moderate intensity exercise per week  as tolerated. Counseled on medication adherence and adverse effects. - Follow-up with primary provider in 4 weeks or sooner if needed for blood pressure check.  - carvedilol (COREG) 6.25 MG tablet; Take 1 tablet (6.25 mg total) by mouth 2 (two) times daily with a meal.  Dispense: 180 tablet; Refill: 0 - furosemide (LASIX) 40 MG tablet; Take 1 tablet (40 mg total) by mouth daily.  Dispense: 90 tablet; Refill: 0 - hydrALAZINE (APRESOLINE) 25 MG tablet; Take 1 tablet (25 mg total) by mouth 2 (two) times daily.  Dispense: 180 tablet; Refill: 0 - spironolactone (ALDACTONE) 100 MG tablet; Take 1 tablet (100 mg total) by mouth daily.  Dispense: 90 tablet; Refill: 0  2. Type 2 diabetes mellitus with hyperglycemia, with long-term current use of insulin (HCC) - Continue Insulin Glargine as prescribed. Counseled on medication adherence/adverse effects.  - Hemoglobin A1c result pending. - Routine screening.  - Discussed the importance of healthy eating habits, low-carbohydrate diet, low-sugar diet, regular aerobic exercise (at least 150 minutes a week as tolerated) and medication compliance to achieve or maintain control of diabetes. - Follow-up with primary provider as scheduled.  - POCT glycosylated hemoglobin (Hb A1C); Future - Insulin Glargine (BASAGLAR KWIKPEN) 100 UNIT/ML; Inject 15 Units into the skin daily.  Dispense: 15 mL; Refill: 0 - Insulin Pen Needle 32G X 4 MM MISC; Use once daily  Dispense: 100 each; Refill: 0 - Microalbumin / creatinine urine ratio  3. Diabetic eye exam [facility]) - Referral to Ophthalmology for evaluation/management. - Ambulatory referral to Ophthalmology  4. Encounter for diabetic foot exam [facility]) - Referral to Podiatry for evaluation/management. - Ambulatory referral to Podiatry  5.  Right foot pain - Gabapentin as prescribed. Counseled on medication adherence/adverse effects.  - Referral to Podiatry for evaluation/management. During the interim follow-up with primary  provider as scheduled until established with referral. - Ambulatory referral to Podiatry - gabapentin (NEURONTIN) 300 MG capsule; Take 1 capsule (300 mg total) by mouth at bedtime.  Dispense: 30 capsule; Refill: 1    Patient was given the opportunity to ask questions.  Patient verbalized understanding of the plan and was able to repeat key elements of the plan. Patient was given clear instructions to go to Emergency Department or return to medical center if symptoms don't improve, worsen, or new problems develop.The patient verbalized understanding.   Orders Placed This Encounter  Procedures   Microalbumin / creatinine urine ratio   Ambulatory referral to Ophthalmology   Ambulatory referral to Podiatry   POCT glycosylated hemoglobin (Hb A1C)     Requested Prescriptions   Signed Prescriptions Disp Refills   carvedilol (COREG) 6.25 MG tablet 180 tablet 0    Sig: Take 1 tablet (6.25 mg total) by mouth 2 (two) times daily with a meal.   furosemide (LASIX) 40 MG tablet 90 tablet 0    Sig: Take 1 tablet (40 mg total) by mouth daily.   hydrALAZINE (APRESOLINE) 25 MG tablet 180 tablet 0    Sig: Take 1 tablet (25 mg total) by mouth 2 (two) times daily.   spironolactone (ALDACTONE) 100 MG tablet 90 tablet 0    Sig: Take 1 tablet (100 mg total) by mouth daily.   Insulin Glargine (BASAGLAR KWIKPEN) 100 UNIT/ML 15 mL 0    Sig: Inject 15 Units into the skin daily.   Insulin Pen Needle 32G X 4 MM MISC 100 each 0    Sig: Use once daily   gabapentin (NEURONTIN) 300 MG capsule 30 capsule 1    Sig: Take 1 capsule (300 mg total) by mouth at bedtime.    Return in about 3 months (around 09/02/2023) for Follow-Up or next available chronic conditions and 4 weeks blood pressure check.  [name], NP

## 2023-06-04 NOTE — Progress Notes (Signed)
Patient states last week when she got out of bed she could feel pain from her foot to her butt there is pain.   Patient wants Flu vaccine.

## 2023-06-06 LAB — MICROALBUMIN / CREATININE URINE RATIO
Creatinine, Urine: 14.5 mg/dL
Microalb/Creat Ratio: <21 mg/g{creat} (ref 0–29)
Microalbumin, Urine: <3 ug/mL

## 2023-06-14 ENCOUNTER — Encounter: Payer: Self-pay | Admitting: Podiatry

## 2023-06-14 ENCOUNTER — Ambulatory Visit (INDEPENDENT_AMBULATORY_CARE_PROVIDER_SITE_OTHER): Payer: Self-pay | Admitting: Podiatry

## 2023-06-14 VITALS — Ht 64.0 in | Wt 175.0 lb

## 2023-06-14 DIAGNOSIS — B351 Tinea unguium: Secondary | ICD-10-CM

## 2023-06-14 DIAGNOSIS — M79675 Pain in left toe(s): Secondary | ICD-10-CM

## 2023-06-14 DIAGNOSIS — E119 Type 2 diabetes mellitus without complications: Secondary | ICD-10-CM

## 2023-06-14 DIAGNOSIS — M79674 Pain in right toe(s): Secondary | ICD-10-CM

## 2023-06-14 NOTE — Progress Notes (Signed)
   Chief Complaint  Patient presents with   Diabetes    NP DMII, here for RFC Last A1C: 7.6 (06/04/2023)    SUBJECTIVE Patient with a history of diabetes mellitus presents to office today complaining of elongated, thickened nails that cause pain while ambulating in shoes.  Patient is unable to trim their own nails. Patient is here for further evaluation and treatment.  Past Medical History:  Diagnosis Date   Hypertension     No Known Allergies   OBJECTIVE General Patient is awake, alert, and oriented x 3 and in no acute distress. Derm Skin is dry and supple bilateral. Negative open lesions or macerations. Remaining integument unremarkable. Nails are tender, long, thickened and dystrophic with subungual debris, consistent with onychomycosis, 1-5 bilateral. No signs of infection noted. Vasc  DP and PT pedal pulses palpable bilaterally. Temperature gradient within normal limits.  Neuro Epicritic and protective threshold sensation diminished bilaterally.  Musculoskeletal Exam No symptomatic pedal deformities noted bilateral. Muscular strength within normal limits.  ASSESSMENT 1. Diabetes Mellitus w/ peripheral neuropathy 2.  Pain due to onychomycosis of toenails bilateral  PLAN OF CARE 1. Patient evaluated today.  Comprehensive diabetic foot exam performed today 2. Instructed to maintain good pedal hygiene and foot care. Stressed importance of controlling blood sugar.  3. Mechanical debridement of nails 1-5 bilaterally performed using a nail nipper. Filed with dremel without incident.  4. Return to clinic as needed    Felecia Shelling, DPM [facility]  Dr. Felecia Shelling, DPM    [address] [place], [place] [address]                Office [phone]  Fax [phone]

## 2023-06-17 ENCOUNTER — Ambulatory Visit: Payer: Self-pay | Admitting: Gastroenterology

## 2023-06-28 ENCOUNTER — Other Ambulatory Visit: Payer: Self-pay

## 2023-07-01 ENCOUNTER — Other Ambulatory Visit: Payer: Self-pay

## 2023-07-02 ENCOUNTER — Other Ambulatory Visit: Payer: Self-pay

## 2023-07-02 ENCOUNTER — Ambulatory Visit (INDEPENDENT_AMBULATORY_CARE_PROVIDER_SITE_OTHER): Payer: Self-pay | Admitting: Family

## 2023-07-02 ENCOUNTER — Ambulatory Visit (INDEPENDENT_AMBULATORY_CARE_PROVIDER_SITE_OTHER): Payer: Self-pay | Admitting: Gastroenterology

## 2023-07-02 VITALS — BP 134/81 | HR 64 | Temp 97.7°F | Ht 64.0 in | Wt 169.8 lb

## 2023-07-02 VITALS — BP 148/77 | HR 62 | Temp 98.2°F | Wt 167.0 lb

## 2023-07-02 DIAGNOSIS — Z8719 Personal history of other diseases of the digestive system: Secondary | ICD-10-CM

## 2023-07-02 DIAGNOSIS — R772 Abnormality of alphafetoprotein: Secondary | ICD-10-CM

## 2023-07-02 DIAGNOSIS — Z13228 Encounter for screening for other metabolic disorders: Secondary | ICD-10-CM

## 2023-07-02 DIAGNOSIS — Z8619 Personal history of other infectious and parasitic diseases: Secondary | ICD-10-CM

## 2023-07-02 DIAGNOSIS — I10 Essential (primary) hypertension: Secondary | ICD-10-CM

## 2023-07-02 DIAGNOSIS — E1165 Type 2 diabetes mellitus with hyperglycemia: Secondary | ICD-10-CM

## 2023-07-02 DIAGNOSIS — Z794 Long term (current) use of insulin: Secondary | ICD-10-CM

## 2023-07-02 DIAGNOSIS — B182 Chronic viral hepatitis C: Secondary | ICD-10-CM

## 2023-07-02 MED ORDER — BASAGLAR KWIKPEN 100 UNIT/ML ~~LOC~~ SOPN
20.0000 [IU] | PEN_INJECTOR | Freq: Every day | SUBCUTANEOUS | 1 refills | Status: DC
  Filled 2023-07-02: qty 6, 30d supply, fill #0
  Filled 2023-07-02: qty 15, 75d supply, fill #0
  Filled 2023-08-02: qty 6, 30d supply, fill #1
  Filled 2023-09-04 – 2023-09-05 (×2): qty 6, 30d supply, fill #2

## 2023-07-02 MED ORDER — INSULIN PEN NEEDLE 32G X 4 MM MISC
1.0000 | Freq: Every day | 0 refills | Status: AC
  Filled 2023-07-02: qty 100, 100d supply, fill #0

## 2023-07-02 NOTE — Progress Notes (Signed)
Patient wants to talk about diabetes pens that she needs more of.

## 2023-07-02 NOTE — Progress Notes (Signed)
 Patient ID: Mary Anderson, female    DOB: 16-Mar-1966  MRN: [id number]  CC: Chronic Conditions Follow-Up  Subjective: Mary Anderson is a 57 y.o. female who presents for chronic conditions follow-up.   Her concerns today include:  - Doing well on Carvedilol , Furosemide , Hydralazine , and Spironolactone , no issues/concerns. Home blood pressures 120's/80's. She does not complain of red flag symptoms such as but not limited to chest pain, shortness of breath, worst headache of life, nausea/vomiting.  - Doing well on Insulin  Glargine, no issues/concerns. Home blood sugars 110's. Denies red flag symptoms associated with diabetes.   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 10/26/2021   Cirrhosis (HCC) 09/11/2017   Splenomegaly 09/11/2017   Thrombocytopenia (HCC) 09/11/2017   Calculus of gallbladder without cholecystitis without obstruction 09/10/2017   Chronic hepatitis C (HCC) 08/30/2017   Hypertension 07/31/2017     Current Outpatient Medications on File Prior to Visit  Medication Sig Dispense Refill   Accu-Chek Softclix Lancets lancets Use as directed up to four times daily 200 each 0   Blood Glucose Monitoring Suppl (ACCU-CHEK GUIDE) w/Device KIT Use as directed up to four times daily 1 kit 0   carvedilol  (COREG ) 6.25 MG tablet Take 1 tablet (6.25 mg total) by mouth 2 (two) times daily with a meal. 180 tablet 0   doxycycline  (VIBRA -TABS) 100 MG tablet Take 1 tablet (100 mg total) by mouth 2 (two) times daily. 13 tablet 0   folic acid  (FOLVITE ) 1 MG tablet TAKE 1 TABLET BY MOUTH EVERY DAY 90 tablet 3   furosemide  (LASIX ) 40 MG tablet Take 1  tablet (40 mg total) by mouth daily. 90 tablet 0   gabapentin  (NEURONTIN ) 300 MG capsule Take 1 capsule (300 mg total) by mouth at bedtime. 30 capsule 1   glucose blood (ACCU-CHEK GUIDE) test strip Use as directed up to four times daily. 100 each 0   hydrALAZINE  (APRESOLINE ) 25 MG tablet Take 1 tablet (25 mg total) by mouth 2 (two) times daily. 180 tablet 0   ibuprofen  (ADVIL ,MOTRIN ) 200 MG tablet Take 200-400 mg by mouth daily as needed (pain).     melatonin 5 MG TABS Take 5 mg by mouth.     naproxen  (NAPROSYN ) 500 MG tablet Take 1 tablet (500 mg total) by mouth 2 (two) times daily. 14 tablet 0   promethazine -dextromethorphan (PROMETHAZINE -DM) 6.25-15 MG/5ML syrup Take 5 mLs by mouth 3 (three) times daily as needed for cough. 240 mL 0   spironolactone  (ALDACTONE ) 100 MG tablet Take 1 tablet (100 mg total) by mouth daily. 90 tablet 0   acetaminophen  (TYLENOL ) 500 MG tablet Take 500-1,000 mg by mouth every 8 (eight) hours as needed for mild pain (pain score 1-3). (Patient not taking: Reported on 07/02/2023)     No current facility-administered medications on file prior to visit.    No Known Allergies  Social History   Socioeconomic History   Marital status: Married    Spouse name: Not on file   Number of children: Not on file   Years of education: Not on file   Highest education level: 11th grade  Occupational History   Not on  file  Tobacco Use   Smoking status: Never    Passive exposure: Never   Smokeless tobacco: Never  Vaping Use   Vaping status: Never Used  Substance and Sexual Activity   Alcohol use: Not Currently    Comment: no alcohol the past 6 months.   Drug use: No   Sexual activity: Not on file  Other Topics Concern   Not on file  Social History Narrative   Not on file   Social Drivers of Health   Financial Resource Strain: Low Risk  (07/01/2023)   Overall Financial Resource Strain (CARDIA)    Difficulty of Paying Living Expenses: Not very hard  Food  Insecurity: No Food Insecurity (07/01/2023)   Hunger Vital Sign    Worried About Running Out of Food in the Last Year: Never true    Ran Out of Food in the Last Year: Never true  Transportation Needs: No Transportation Needs (07/01/2023)   PRAPARE - Administrator, Civil Service (Medical): No    Lack of Transportation (Non-Medical): No  Physical Activity: Inactive (07/01/2023)   Exercise Vital Sign    Days of Exercise per Week: 0 days    Minutes of Exercise per Session: 40 min  Stress: No Stress Concern Present (07/01/2023)   [employer] of Occupational Health - Occupational Stress Questionnaire    Feeling of Stress : Not at all  Social Connections: Unknown (07/01/2023)   Social Connection and Isolation Panel [NHANES]    Frequency of Communication with Friends and Family: More than three times a week    Frequency of Social Gatherings with Friends and Family: Patient declined    Attends Religious Services: Patient declined    Database Administrator or Organizations: No    Attends Engineer, Structural: Not on file    Marital Status: Married  Recent Concern: Social Connections - Moderately Isolated (06/03/2023)   Social Connection and Isolation Panel [NHANES]    Frequency of Communication with Friends and Family: More than three times a week    Frequency of Social Gatherings with Friends and Family: More than three times a week    Attends Religious Services: Never    Database Administrator or Organizations: No    Attends Engineer, Structural: Not on file    Marital Status: Married  Catering Manager Violence: Not At Risk (04/19/2022)   Humiliation, Afraid, Rape, and Kick questionnaire    Fear of Current or Ex-Partner: No    Emotionally Abused: No    Physically Abused: No    Sexually Abused: No    Family History  Problem Relation Age of Onset   Hypertension Mother    Colon cancer Mother    Hypertension Brother    Cancer Maternal Uncle     Cancer Maternal Uncle    Clotting disorder Neg Hx    Diabetes Neg Hx     Past Surgical History:  Procedure Laterality Date   ESOPHAGOGASTRODUODENOSCOPY (EGD) WITH PROPOFOL  N/A 01/30/2022   Procedure: ESOPHAGOGASTRODUODENOSCOPY (EGD) WITH PROPOFOL ;  Surgeon: Jinny Carmine, MD;  Location: [facility];  Service: Endoscopy;  Laterality: N/A;   IR THORACENTESIS ASP PLEURAL SPACE W/IMG GUIDE  10/27/2021    ROS: Review of Systems Negative except as stated above  PHYSICAL EXAM: BP 134/81   Pulse 64   Temp 97.7 F (36.5 C) (Oral)   Ht 5' 4 (1.626 m)   Wt 169 lb 12.8 oz (77 kg)   LMP 04/22/2011   SpO2  98%   BMI 29.15 kg/m   Physical Exam HENT:     Head: Normocephalic and atraumatic.     Nose: Nose normal.     Mouth/Throat:     Mouth: Mucous membranes are moist.     Pharynx: Oropharynx is clear.  Eyes:     Extraocular Movements: Extraocular movements intact.     Conjunctiva/sclera: Conjunctivae normal.     Pupils: Pupils are equal, round, and reactive to light.  Cardiovascular:     Rate and Rhythm: Normal rate and regular rhythm.     Pulses: Normal pulses.     Heart sounds: Normal heart sounds.  Pulmonary:     Effort: Pulmonary effort is normal.     Breath sounds: Normal breath sounds.  Musculoskeletal:        General: Normal range of motion.     Cervical back: Normal range of motion and neck supple.  Neurological:     General: No focal deficit present.     Mental Status: She is alert and oriented to person, place, and time.  Psychiatric:        Mood and Affect: Mood normal.        Behavior: Behavior normal.     ASSESSMENT AND PLAN: 1. Primary hypertension (Primary) - Continue Carvedilol , Furosemide , Hydralazine , and Spironolactone  as prescribed.  - Counseled on blood pressure goal of less than 130/80, low-sodium, DASH diet, medication compliance, and 150 minutes of moderate intensity exercise per week as tolerated. Counseled on medication adherence and adverse  effects. - Follow-up with primary provider in 3 months or sooner if needed.   2. Type 2 diabetes mellitus with hyperglycemia, with long-term current use of insulin  (HCC) - Continue Insulin  Glargine as prescribed. Counseled on medication adherence/adverse effects.  - Hemoglobin A1c result pending. - Discussed the importance of healthy eating habits, low-carbohydrate diet, low-sugar diet, regular aerobic exercise (at least 150 minutes a week as tolerated) and medication compliance to achieve or maintain control of diabetes. - Follow-up with primary provider as scheduled.  - Insulin  Glargine (BASAGLAR  KWIKPEN) 100 UNIT/ML; Inject 20 Units into the skin daily.  Dispense: 15 mL; Refill: 1 - Insulin  Pen Needle 32G X 4 MM MISC; Use once daily  Dispense: 100 each; Refill: 0 - Hemoglobin A1c  3. Screening for metabolic disorder - Routine screening.  - Hepatic Function Panel   Patient was given the opportunity to ask questions.  Patient verbalized understanding of the plan and was able to repeat key elements of the plan. Patient was given clear instructions to go to Emergency Department or return to medical center if symptoms don't improve, worsen, or new problems develop.The patient verbalized understanding.   Orders Placed This Encounter  Procedures   Hemoglobin A1c   Hepatic Function Panel     Requested Prescriptions   Signed Prescriptions Disp Refills   Insulin  Glargine (BASAGLAR  KWIKPEN) 100 UNIT/ML 15 mL 1    Sig: Inject 20 Units into the skin daily.   Insulin  Pen Needle 32G X 4 MM MISC 100 each 0    Sig: Use once daily    Return in about 3 months (around 09/30/2023) for Follow-Up or next available chronic conditions.  Greig JINNY Drones, NP

## 2023-07-02 NOTE — Patient Instructions (Signed)
Referral has been sent to [facility] for you to follow up regarding your liver. Located [id number], [place], [place]  They will call you to schedule consultation.  Their phone number is [phone]

## 2023-07-02 NOTE — Progress Notes (Signed)
 Primary Care Physician: Lorren Greig PARAS, NP  Primary Gastroenterologist:  Dr. Rogelia Copping  Chief Complaint  Patient presents with   Follow-up   Cirrhosis    HPI: Mary Anderson is a 57 y.o. 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  The patient seeing me last year with a elevated alpha-fetoprotein.  The patient had an MRI which showed a possible lesion in the liver with subsequent MRIs not showing any repeat of this finding.  The patient has had a elevated alpha-fetoprotein that continues to increase.  The patient is followed by Dr. Melanee in oncology.  The patient's alpha-fetoprotein trends have shown:  Component     Latest Ref Rng 10/26/2021 03/16/2022 10/08/2022 04/12/2023  AFP, Serum, Tumor Marker     0.0 - 9.2 ng/mL 9.6 (H)  18.0 (H)  12.9 (H)  80.5 (H)    The patient has a history of decompensated cirrhosis and with the decompensated cirrhosis and hepatitis C she was recommended to go to a tertiary care center for treatment of her hepatitis C.  It does not appear that this was done.  The patient denies any issues the present time.  The patient comes with her significant other who states that he was told that he has hepatitis C also but has not been sent for treatment or evaluation.  The patient denies following up after her last visit with a tertiary care center or being treated for her hepatitis C.  Past Medical History:  Diagnosis Date   Hypertension     Current Outpatient Medications  Medication Sig Dispense Refill   Accu-Chek Softclix Lancets lancets Use as directed up to four times daily 200 each 0   acetaminophen  (TYLENOL ) 500 MG tablet Take 500-1,000 mg by mouth every 8 (eight) hours as needed for mild pain (pain score 1-3).     Blood Glucose Monitoring Suppl (ACCU-CHEK GUIDE) w/Device KIT Use as directed up to four times daily 1 kit 0   carvedilol  (COREG ) 6.25 MG tablet Take 1 tablet (6.25 mg total) by mouth 2 (two) times daily with a meal. 180  tablet 0   folic acid  (FOLVITE ) 1 MG tablet TAKE 1 TABLET BY MOUTH EVERY DAY 90 tablet 3   furosemide  (LASIX ) 40 MG tablet Take 1 tablet (40 mg total) by mouth daily. 90 tablet 0   gabapentin  (NEURONTIN ) 300 MG capsule Take 1 capsule (300 mg total) by mouth at bedtime. 30 capsule 1   glucose blood (ACCU-CHEK GUIDE) test strip Use as directed up to four times daily. 100 each 0   hydrALAZINE  (APRESOLINE ) 25 MG tablet Take 1 tablet (25 mg total) by mouth 2 (two) times daily. 180 tablet 0   ibuprofen  (ADVIL ,MOTRIN ) 200 MG tablet Take 200-400 mg by mouth daily as needed (pain).     Insulin  Glargine (BASAGLAR  KWIKPEN) 100 UNIT/ML Inject 20 Units into the skin daily. 15 mL 1   Insulin  Pen Needle 32G X 4 MM MISC Use once daily 100 each 0   melatonin 5 MG TABS Take 5 mg by mouth.     naproxen  (NAPROSYN ) 500 MG tablet Take 1 tablet (500 mg total) by mouth 2 (two) times daily. 14 tablet 0   spironolactone  (ALDACTONE ) 100 MG tablet Take 1 tablet (100 mg total) by mouth daily. 90 tablet 0   No current facility-administered medications for this visit.    Allergies as of 07/02/2023   (No Known Allergies)    ROS:  General:  Negative for anorexia, weight loss, fever, chills, fatigue, weakness. ENT: Negative for hoarseness, difficulty swallowing , nasal congestion. CV: Negative for chest pain, angina, palpitations, dyspnea on exertion, peripheral edema.  Respiratory: Negative for dyspnea at rest, dyspnea on exertion, cough, sputum, wheezing.  GI: See history of present illness. GU:  Negative for dysuria, hematuria, urinary incontinence, urinary frequency, nocturnal urination.  Endo: Negative for unusual weight change.    Physical Examination:   BP (!) 148/77 (BP Location: Right Arm, Patient Position: Sitting, Cuff Size: Large)   Pulse 62   Temp 98.2 F (36.8 C) (Oral)   Wt 167 lb (75.8 kg)   LMP 04/22/2011   BMI 28.67 kg/m   General: Well-nourished, well-developed in no acute distress.  Eyes:  No icterus. Conjunctivae pink. Neuro: Alert and oriented x 3.  Grossly intact. Skin: Warm and dry, no jaundice.   Psych: Alert and cooperative, normal mood and affect.  Labs:    Imaging Studies: No results found.  Assessment and Plan:   Mary Anderson is a 57 y.o. y/o female who comes in today with a history of hepatitis C with decompensated cirrhosis thrombocytopenia and a history of ascites.  The patient will be sent to a tertiary care center for treatment of her hepatitis C.  The patient is following up with oncology due to her elevated alpha-fetoprotein.  The patient significant other has been told that he should seek a referral from his primary care provider to also get treated for his hepatitis C.  The patient has also been told that she needs a repeat EGD next year to evaluate her for possible varices.  The patient has been explained the plan and agrees with it.     Rogelia Copping, MD. NOLIA    Note: This dictation was prepared with Dragon dictation along with smaller phrase technology. Any transcriptional errors that result from this process are unintentional.

## 2023-07-03 LAB — HEPATIC FUNCTION PANEL
ALT: 110 [IU]/L — ABNORMAL HIGH (ref 0–32)
AST: 149 [IU]/L — ABNORMAL HIGH (ref 0–40)
Albumin: 3.4 g/dL — ABNORMAL LOW (ref 3.8–4.9)
Alkaline Phosphatase: 175 [IU]/L — ABNORMAL HIGH (ref 44–121)
Bilirubin Total: 2.3 mg/dL — ABNORMAL HIGH (ref 0.0–1.2)
Bilirubin, Direct: 1.31 mg/dL — ABNORMAL HIGH (ref 0.00–0.40)
Total Protein: 6.8 g/dL (ref 6.0–8.5)

## 2023-07-03 LAB — HEMOGLOBIN A1C
Est. average glucose Bld gHb Est-mCnc: 206 mg/dL
Hgb A1c MFr Bld: 8.8 % — ABNORMAL HIGH (ref 4.8–5.6)

## 2023-07-04 ENCOUNTER — Other Ambulatory Visit: Payer: Self-pay | Admitting: Family

## 2023-07-04 DIAGNOSIS — Z794 Long term (current) use of insulin: Secondary | ICD-10-CM

## 2023-07-04 DIAGNOSIS — R748 Abnormal levels of other serum enzymes: Secondary | ICD-10-CM

## 2023-07-08 ENCOUNTER — Other Ambulatory Visit: Payer: Self-pay

## 2023-08-01 ENCOUNTER — Other Ambulatory Visit: Payer: Self-pay

## 2023-08-01 ENCOUNTER — Other Ambulatory Visit: Payer: Self-pay | Admitting: Family

## 2023-08-01 DIAGNOSIS — M79671 Pain in right foot: Secondary | ICD-10-CM

## 2023-08-01 MED ORDER — GABAPENTIN 300 MG PO CAPS
300.0000 mg | ORAL_CAPSULE | Freq: Every day | ORAL | 2 refills | Status: DC
  Filled 2023-08-01 – 2023-08-02 (×2): qty 30, 30d supply, fill #0
  Filled 2023-09-04 – 2023-09-05 (×2): qty 30, 30d supply, fill #1
  Filled 2023-09-20 – 2023-10-04 (×3): qty 30, 30d supply, fill #2

## 2023-08-01 NOTE — Telephone Encounter (Signed)
Complete

## 2023-08-03 ENCOUNTER — Other Ambulatory Visit: Payer: Self-pay

## 2023-08-05 ENCOUNTER — Other Ambulatory Visit: Payer: Self-pay

## 2023-08-07 ENCOUNTER — Other Ambulatory Visit: Payer: Self-pay

## 2023-09-02 ENCOUNTER — Ambulatory Visit: Payer: Self-pay | Admitting: Family

## 2023-09-04 ENCOUNTER — Other Ambulatory Visit: Payer: Self-pay | Admitting: Family

## 2023-09-04 DIAGNOSIS — E1165 Type 2 diabetes mellitus with hyperglycemia: Secondary | ICD-10-CM

## 2023-09-04 NOTE — Telephone Encounter (Signed)
 Copied from CRM [id number]. Topic: Clinical - Medication Refill >> Sep 04, 2023 10:49 AM DeAngela L wrote: Most Recent Primary Care Visit:  Provider: Ricky Stabs J  Department: [facility]  Visit Type: OFFICE VISIT  Date: 07/02/2023  Medication: Patient says she used the last pen this morning and doesn't have enough to last till her appt  Insulin Glargine (BASAGLAR KWIKPEN) 100 UNIT/ML  Has the patient contacted their pharmacy? no (Agent: If no, request that the patient contact the pharmacy for the refill. If patient does not wish to contact the pharmacy document the reason why and proceed with request.) (Agent: If yes, when and what did the pharmacy advise?)  Is this the correct pharmacy for this prescription? yes If no, delete pharmacy and type the correct one.  This is the patient's preferred pharmacy:  [facility] [address] [place] [address] Phone: [phone] Fax: [phone]    Has the prescription been filled recently? yes  Is the patient out of the medication? yes  Has the patient been seen for an appointment in the last year OR does the patient have an upcoming appointment? yes  Can we respond through MyChart? yes  Agent: Please be advised that Rx refills may take up to 3 business days. We ask that you follow-up with your pharmacy.

## 2023-09-05 ENCOUNTER — Other Ambulatory Visit: Payer: Self-pay

## 2023-09-05 MED ORDER — BASAGLAR KWIKPEN 100 UNIT/ML ~~LOC~~ SOPN
20.0000 [IU] | PEN_INJECTOR | Freq: Every day | SUBCUTANEOUS | 0 refills | Status: DC
Start: 2023-09-05 — End: 2023-09-30
  Filled 2023-09-20: qty 15, 75d supply, fill #0
  Filled ????-??-??: fill #0

## 2023-09-05 NOTE — Telephone Encounter (Signed)
 Labs are in date A1C.    Requested Prescriptions  Pending Prescriptions Disp Refills   Insulin Glargine (BASAGLAR KWIKPEN) 100 UNIT/ML 15 mL 1    Sig: Inject 20 Units into the skin daily.     Endocrinology:  Diabetes - Insulins Failed - 09/05/2023 10:08 AM      Failed - HBA1C is between 0 and 7.9 and within 180 days    HbA1c, POC (prediabetic range)  Date Value Ref Range Status  08/29/2022 6.2 5.7 - 6.4 % Final   HbA1c, POC (controlled diabetic range)  Date Value Ref Range Status  06/04/2023 7.6 (A) 0.0 - 7.0 % Final   Hgb A1c MFr Bld  Date Value Ref Range Status  07/02/2023 8.8 (H) 4.8 - 5.6 % Final    Comment:             Prediabetes: 5.7 - 6.4          Diabetes: >6.4          Glycemic control for adults with diabetes: <7.0          Passed - Valid encounter within last 6 months    Recent Outpatient Visits           2 months ago Primary hypertension   [facility], Amy J, NP   3 months ago Diabetic eye exam [facility])   [facility], Amy J, NP   6 months ago Screening cholesterol level   [facility], Amy J, NP   9 months ago Primary hypertension   [facility] [facility], [place], NP   1 year ago Primary hypertension   [facility], Salomon Fick, NP       Future Appointments             In 3 weeks [name], NP [facility]

## 2023-09-21 ENCOUNTER — Other Ambulatory Visit (HOSPITAL_BASED_OUTPATIENT_CLINIC_OR_DEPARTMENT_OTHER): Payer: Self-pay

## 2023-09-23 ENCOUNTER — Other Ambulatory Visit: Payer: Self-pay

## 2023-09-23 ENCOUNTER — Other Ambulatory Visit: Payer: Self-pay | Admitting: Family

## 2023-09-23 DIAGNOSIS — I10 Essential (primary) hypertension: Secondary | ICD-10-CM

## 2023-09-23 DIAGNOSIS — E1165 Type 2 diabetes mellitus with hyperglycemia: Secondary | ICD-10-CM

## 2023-09-23 MED ORDER — SPIRONOLACTONE 100 MG PO TABS
100.0000 mg | ORAL_TABLET | Freq: Every day | ORAL | 0 refills | Status: DC
  Filled 2023-09-23: qty 90, 90d supply, fill #0

## 2023-09-23 MED ORDER — TRUEPLUS 5-BEVEL PEN NEEDLES 32G X 4 MM MISC
1.0000 | Freq: Every day | 0 refills | Status: AC
Start: 2023-09-23 — End: ?
  Filled 2023-09-23: qty 100, 100d supply, fill #0

## 2023-09-23 MED ORDER — CARVEDILOL 6.25 MG PO TABS
6.2500 mg | ORAL_TABLET | Freq: Two times a day (BID) | ORAL | 0 refills | Status: DC
  Filled 2023-09-23: qty 180, 90d supply, fill #0

## 2023-09-23 MED ORDER — HYDRALAZINE HCL 25 MG PO TABS
25.0000 mg | ORAL_TABLET | Freq: Two times a day (BID) | ORAL | 0 refills | Status: DC
  Filled 2023-09-23: qty 180, 90d supply, fill #0

## 2023-09-23 MED ORDER — FUROSEMIDE 40 MG PO TABS
40.0000 mg | ORAL_TABLET | Freq: Every day | ORAL | 0 refills | Status: DC
  Filled 2023-09-23: qty 90, 90d supply, fill #0

## 2023-09-23 NOTE — Telephone Encounter (Signed)
 Complete

## 2023-09-26 ENCOUNTER — Other Ambulatory Visit: Payer: Self-pay

## 2023-09-30 ENCOUNTER — Encounter: Payer: Self-pay | Admitting: Family

## 2023-09-30 ENCOUNTER — Other Ambulatory Visit: Payer: Self-pay

## 2023-09-30 ENCOUNTER — Other Ambulatory Visit: Payer: Self-pay | Admitting: Family

## 2023-09-30 ENCOUNTER — Ambulatory Visit (INDEPENDENT_AMBULATORY_CARE_PROVIDER_SITE_OTHER): Payer: Self-pay | Admitting: Family

## 2023-09-30 VITALS — BP 134/82 | HR 62 | Temp 98.1°F | Ht 64.5 in | Wt 170.6 lb

## 2023-09-30 DIAGNOSIS — E569 Vitamin deficiency, unspecified: Secondary | ICD-10-CM

## 2023-09-30 DIAGNOSIS — E1165 Type 2 diabetes mellitus with hyperglycemia: Secondary | ICD-10-CM

## 2023-09-30 DIAGNOSIS — Z794 Long term (current) use of insulin: Secondary | ICD-10-CM

## 2023-09-30 DIAGNOSIS — E119 Type 2 diabetes mellitus without complications: Secondary | ICD-10-CM

## 2023-09-30 DIAGNOSIS — R748 Abnormal levels of other serum enzymes: Secondary | ICD-10-CM

## 2023-09-30 DIAGNOSIS — I10 Essential (primary) hypertension: Secondary | ICD-10-CM

## 2023-09-30 LAB — POCT GLYCOSYLATED HEMOGLOBIN (HGB A1C): HbA1c, POC (controlled diabetic range): 7.1 % — AB (ref 0.0–7.0)

## 2023-09-30 MED ORDER — FOLIC ACID 1 MG PO TABS
1.0000 mg | ORAL_TABLET | Freq: Every day | ORAL | 3 refills | Status: DC
  Filled 2023-09-30: qty 60, 60d supply, fill #0
  Filled 2023-11-28: qty 30, 30d supply, fill #1

## 2023-09-30 MED ORDER — BASAGLAR KWIKPEN 100 UNIT/ML ~~LOC~~ SOPN
20.0000 [IU] | PEN_INJECTOR | Freq: Every day | SUBCUTANEOUS | 0 refills | Status: DC
  Filled 2023-09-30: qty 6, 30d supply, fill #0

## 2023-09-30 MED ORDER — BASAGLAR KWIKPEN 100 UNIT/ML ~~LOC~~ SOPN
23.0000 [IU] | PEN_INJECTOR | Freq: Every day | SUBCUTANEOUS | 1 refills | Status: DC
  Filled 2023-09-30: qty 9, 39d supply, fill #0
  Filled 2023-11-01: qty 9, 39d supply, fill #1
  Filled 2023-12-11: qty 9, 39d supply, fill #2

## 2023-09-30 NOTE — Progress Notes (Signed)
 Patient states nothing else to discuss.   Patient needs refill on insulin.

## 2023-09-30 NOTE — Progress Notes (Signed)
 Patient ID: Mary Anderson, female    DOB: March 24, 1966  MRN: [id number]  CC: Chronic Conditions Follow-Up  Subjective: Mary Anderson is a 58 y.o. female who presents for chronic conditions follow-up.   Her concerns today include:  - Doing well on Carvedilol, Furosemide, Hydralazine, and Spironolactone, no issues/concerns. She does not complain of red flag symptoms such as but not limited to chest pain, shortness of breath, worst headache of life, nausea/vomiting. - Reports taking Insulin Glargine 20 units daily, doing well on regimen no issues/concerns. Reports since previous office visit she did not establish with Endocrinology. Home blood sugars 110's.  Denies red flag symptoms associated with diabetes.   - Reports since previous office visit did not establish with Gastroenterology.  - Reports needs refills of Folic Acid. Doing well on regimen no issues/concerns.  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     Blood Glucose Monitoring Suppl (ACCU-CHEK GUIDE) w/Device KIT Use as directed up to four  times daily 1 kit 0   carvedilol (COREG) 6.25 MG tablet Take 1 tablet (6.25 mg total) by mouth 2 (two) times daily with a meal. 180 tablet 0   furosemide (LASIX) 40 MG tablet Take 1 tablet (40 mg total) by mouth daily. 90 tablet 0   gabapentin (NEURONTIN) 300 MG capsule Take 1 capsule (300 mg total) by mouth at bedtime. 30 capsule 2   glucose blood (ACCU-CHEK GUIDE) test strip Use as directed up to four times daily. 100 each 0   hydrALAZINE (APRESOLINE) 25 MG tablet Take 1 tablet (25 mg total) by mouth 2 (two) times daily. 180 tablet 0   ibuprofen (ADVIL,MOTRIN) 200 MG tablet Take 200-400 mg by mouth daily as needed (pain).     Insulin Pen Needle (TRUEPLUS 5-BEVEL PEN NEEDLES) 32G X 4 MM MISC Use once daily 100 each 0   Insulin Pen Needle 32G X 4 MM MISC Use once daily 100 each 0   melatonin 5 MG TABS Take 5 mg by mouth.     naproxen (NAPROSYN) 500 MG tablet Take 1 tablet (500 mg total) by mouth 2 (two) times daily. 14 tablet 0   spironolactone (ALDACTONE) 100 MG tablet Take 1 tablet (100 mg total) by mouth daily. 90 tablet 0   No current facility-administered medications on file prior to visit.    No Known Allergies  Social History   Socioeconomic History   Marital status: Married    Spouse name: Not on file   Number of children: Not on file   Years of education: Not on file   Highest education level: 11th grade  Occupational History   Not on file  Tobacco Use   Smoking status: Never    Passive exposure: Never   Smokeless tobacco: Never  Vaping Use   Vaping status: Never Used  Substance and Sexual Activity   Alcohol use: Not Currently    Comment: no alcohol the past 6 months.   Drug use: No   Sexual activity: Not on file  Other Topics Concern   Not on file  Social History Narrative   Not on file   Social Drivers of Health   Financial Resource Strain: Low Risk  (07/01/2023)   Overall Financial Resource Strain (CARDIA)    Difficulty of Paying Living Expenses: Not very  hard  Food Insecurity: No Food Insecurity (07/01/2023)   Hunger Vital Sign    Worried About Running Out of Food in the Last Year: Never true    Ran Out of Food in the Last Year: Never true  Transportation Needs: No Transportation Needs (07/01/2023)   PRAPARE - Administrator, Civil Service (Medical): No    Lack of Transportation (Non-Medical): No  Physical Activity: Inactive (07/01/2023)   Exercise Vital Sign    Days of Exercise per Week: 0 days    Minutes of Exercise per Session: 40 min  Stress: No Stress Concern Present (07/01/2023)   [employer] of Occupational Health - Occupational Stress Questionnaire    Feeling of Stress : Not at all  Social Connections: Unknown (07/01/2023)   Social Connection and Isolation Panel [NHANES]    Frequency of Communication with Friends and Family: More than three times a week    Frequency of Social Gatherings with Friends and Family: Patient declined    Attends Religious Services: Patient declined    Database administrator or Organizations: No    Attends Engineer, structural: Not on file    Marital Status: Married  Recent Concern: Social Connections - Moderately Isolated (06/03/2023)   Social Connection and Isolation Panel [NHANES]    Frequency of Communication with Friends and Family: More than three times a week    Frequency of Social Gatherings with Friends and Family: More than three times a week    Attends Religious Services: Never    Database administrator or Organizations: No    Attends Engineer, structural: Not on file    Marital Status: Married  Catering manager Violence: Not At Risk (04/19/2022)   Humiliation, Afraid, Rape, and Kick questionnaire    Fear of Current or Ex-Partner: No    Emotionally Abused: No    Physically Abused: No    Sexually Abused: No    Family History  Problem Relation Age of Onset   Hypertension Mother    Colon cancer Mother    Hypertension Brother    Cancer Maternal  Uncle    Cancer Maternal Uncle    Clotting disorder Neg Hx    Diabetes Neg Hx     Past Surgical History:  Procedure Laterality Date   ESOPHAGOGASTRODUODENOSCOPY (EGD) WITH PROPOFOL N/A 01/30/2022   Procedure: ESOPHAGOGASTRODUODENOSCOPY (EGD) WITH PROPOFOL;  Surgeon: Midge Minium, MD;  Location: [facility] ENDOSCOPY;  Service: Endoscopy;  Laterality: N/A;   IR THORACENTESIS ASP PLEURAL SPACE W/IMG GUIDE  10/27/2021    ROS: Review of Systems Negative except as stated above  PHYSICAL EXAM: BP 134/82   Pulse 62   Temp 98.1 F (36.7 C) (Oral)   Ht 5' 4.5" (1.638 m)   Wt 170 lb 9.6 oz (77.4 kg)  LMP 04/22/2011   SpO2 94%   BMI 28.83 kg/m   Physical Exam HENT:     Head: Normocephalic and atraumatic.     Nose: Nose normal.     Mouth/Throat:     Mouth: Mucous membranes are moist.     Pharynx: Oropharynx is clear.  Eyes:     Extraocular Movements: Extraocular movements intact.     Conjunctiva/sclera: Conjunctivae normal.     Pupils: Pupils are equal, round, and reactive to light.  Cardiovascular:     Rate and Rhythm: Normal rate and regular rhythm.     Pulses: Normal pulses.     Heart sounds: Normal heart sounds.  Pulmonary:     Effort: Pulmonary effort is normal.     Breath sounds: Normal breath sounds.  Musculoskeletal:        General: Normal range of motion.     Cervical back: Normal range of motion and neck supple.  Neurological:     General: No focal deficit present.     Mental Status: She is alert and oriented to person, place, and time.  Psychiatric:        Mood and Affect: Mood normal.        Behavior: Behavior normal.     ASSESSMENT AND PLAN: 1. Primary hypertension (Primary) - Continue Carvedilol, Furosemide, Hydralazine, and Spironolactone as prescribed. No refills needed as of present. - Counseled on blood pressure goal of less than 130/80, low-sodium, DASH diet, medication compliance, and 150 minutes of moderate intensity exercise per week as tolerated.  Counseled on medication adherence and adverse effects. - Follow-up with primary provider in 3 months or sooner if needed.  2. Type 2 diabetes mellitus with hyperglycemia, with long-term current use of insulin (HCC) - Continue Insulin Glargine as prescribed.  - Hemoglobin A1c result pending.  - Discussed the importance of healthy eating habits, low-carbohydrate diet, low-sugar diet, regular aerobic exercise (at least 150 minutes a week as tolerated) and medication compliance to achieve or maintain control of diabetes. Counseled on medication adherence/adverse effects. - Follow-up with primary provider as scheduled. - Insulin Glargine (BASAGLAR KWIKPEN) 100 UNIT/ML; Inject 20 Units into the skin daily.  Dispense: 15 mL; Refill: 0 - POCT glycosylated hemoglobin (Hb A1C)  3. Diabetic eye exam [facility]) - Referral to Ophthalmology for evaluation/management. - Ambulatory referral to Ophthalmology  4. Vitamin deficiency - Continue Folic Acid as prescribed. Counseled on medication adherence/adverse effects.  - Follow-up with primary provider as scheduled. - folic acid (FOLVITE) 1 MG tablet; Take 1 tablet (1 mg total) by mouth daily.  Dispense: 90 tablet; Refill: 3  5. Elevated liver enzymes - Referral to Gastroenterology for evaluation/management. - Ambulatory referral to Gastroenterology    Patient was given the opportunity to ask questions.  Patient verbalized understanding of the plan and was able to repeat key elements of the plan. Patient was given clear instructions to go to Emergency Department or return to medical center if symptoms don't improve, worsen, or new problems develop.The patient verbalized understanding.   Orders Placed This Encounter  Procedures   Ambulatory referral to Ophthalmology   Ambulatory referral to Gastroenterology   POCT glycosylated hemoglobin (Hb A1C)     Requested Prescriptions   Signed Prescriptions Disp Refills   folic acid (FOLVITE) 1 MG tablet 90  tablet 3    Sig: Take 1 tablet (1 mg total) by mouth daily.   Insulin Glargine (BASAGLAR KWIKPEN) 100 UNIT/ML 15 mL 0    Sig: Inject 20 Units into the  skin daily.    Return in about 3 months (around 12/30/2023) for Follow-Up or next available chronic conditions.  [name], NP

## 2023-10-02 ENCOUNTER — Other Ambulatory Visit: Payer: Self-pay

## 2023-10-04 ENCOUNTER — Other Ambulatory Visit: Payer: Self-pay

## 2023-10-08 ENCOUNTER — Other Ambulatory Visit: Payer: Self-pay

## 2023-10-11 ENCOUNTER — Other Ambulatory Visit: Payer: Self-pay

## 2023-10-11 ENCOUNTER — Ambulatory Visit: Payer: Self-pay | Admitting: Oncology

## 2023-10-25 ENCOUNTER — Inpatient Hospital Stay: Payer: Self-pay | Attending: Oncology

## 2023-10-25 ENCOUNTER — Encounter: Payer: Self-pay | Admitting: Oncology

## 2023-10-25 ENCOUNTER — Inpatient Hospital Stay (HOSPITAL_BASED_OUTPATIENT_CLINIC_OR_DEPARTMENT_OTHER): Payer: Self-pay | Admitting: Oncology

## 2023-10-25 DIAGNOSIS — K7469 Other cirrhosis of liver: Secondary | ICD-10-CM

## 2023-10-25 DIAGNOSIS — K769 Liver disease, unspecified: Secondary | ICD-10-CM

## 2023-10-25 DIAGNOSIS — D61818 Other pancytopenia: Secondary | ICD-10-CM

## 2023-10-25 DIAGNOSIS — R772 Abnormality of alphafetoprotein: Secondary | ICD-10-CM

## 2023-10-25 LAB — CBC WITH DIFFERENTIAL/PLATELET
Abs Immature Granulocytes: 0.01 10*3/uL (ref 0.00–0.07)
Basophils Absolute: 0 10*3/uL (ref 0.0–0.1)
Basophils Relative: 0 %
Eosinophils Absolute: 0 10*3/uL (ref 0.0–0.5)
Eosinophils Relative: 1 %
HCT: 35.7 % — ABNORMAL LOW (ref 36.0–46.0)
Hemoglobin: 12.7 g/dL (ref 12.0–15.0)
Immature Granulocytes: 0 %
Lymphocytes Relative: 23 %
Lymphs Abs: 0.7 10*3/uL (ref 0.7–4.0)
MCH: 36.4 pg — ABNORMAL HIGH (ref 26.0–34.0)
MCHC: 35.6 g/dL (ref 30.0–36.0)
MCV: 102.3 fL — ABNORMAL HIGH (ref 80.0–100.0)
Monocytes Absolute: 0.4 10*3/uL (ref 0.1–1.0)
Monocytes Relative: 15 %
Neutro Abs: 1.7 10*3/uL (ref 1.7–7.7)
Neutrophils Relative %: 61 %
Platelets: 54 10*3/uL — ABNORMAL LOW (ref 150–400)
RBC: 3.49 MIL/uL — ABNORMAL LOW (ref 3.87–5.11)
RDW: 13.1 % (ref 11.5–15.5)
WBC: 2.8 10*3/uL — ABNORMAL LOW (ref 4.0–10.5)
nRBC: 0 % (ref 0.0–0.2)

## 2023-10-25 LAB — COMPREHENSIVE METABOLIC PANEL WITH GFR
ALT: 105 U/L — ABNORMAL HIGH (ref 0–44)
AST: 128 U/L — ABNORMAL HIGH (ref 15–41)
Albumin: 3 g/dL — ABNORMAL LOW (ref 3.5–5.0)
Alkaline Phosphatase: 111 U/L (ref 38–126)
Anion gap: 6 (ref 5–15)
BUN: 22 mg/dL — ABNORMAL HIGH (ref 6–20)
CO2: 27 mmol/L (ref 22–32)
Calcium: 8.6 mg/dL — ABNORMAL LOW (ref 8.9–10.3)
Chloride: 99 mmol/L (ref 98–111)
Creatinine, Ser: 0.98 mg/dL (ref 0.44–1.00)
GFR, Estimated: >60 mL/min (ref >60)
Glucose, Bld: 195 mg/dL — ABNORMAL HIGH (ref 70–99)
Potassium: 4.1 mmol/L (ref 3.5–5.1)
Sodium: 132 mmol/L — ABNORMAL LOW (ref 135–145)
Total Bilirubin: 1.7 mg/dL — ABNORMAL HIGH (ref 0.0–1.2)
Total Protein: 6.5 g/dL (ref 6.5–8.1)

## 2023-10-25 NOTE — Progress Notes (Signed)
 Patient is reporting that she is having frequent nosebleeds and is very stress.

## 2023-10-26 LAB — AFP TUMOR MARKER: AFP, Serum, Tumor Marker: 34.3 ng/mL — ABNORMAL HIGH (ref 0.0–9.2)

## 2023-10-26 NOTE — Progress Notes (Signed)
 Hematology/Oncology Consult note [facility]  [phone] [phone]  Patient Care Team: Senaida Dama, NP as PCP - General (Nurse Practitioner) Avonne Boettcher, MD as Consulting Physician (Oncology)   Name of the patient: Mary Anderson  [id number]  July 16, 1965   Date of visit: 10/26/23  Diagnosis- h/o liver cirrhosis and screening for [facility]   Chief complaint/ Reason for visit- routine f/u of liver cirrhosis and pancytopenia  Heme/Onc history: Patient is a 58 year old female with a past medical history significant for 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 She has been getting HCC surveillance by Dr. Ole Berkeley. She had an MRI liver with and without contrast in September 2023 and was found to have a 16mm focus of subcapsular enhancement in the posterior right liver with no definitive characteristics of washout. This may reflect transient hepatic intensity difference potentially related to underlying vascular malformation. However her AFP was found to be elevated at 18 and therefore patient has been referred for further management. MRI has not shown any liver lesions so far. Patient has pancytopenia secondary to liver cirrhosis  Interval history-patient is under personal stress due to changes in her job schedule.  Denies any changes in her appetite or weight.  Denies any abdominal pain  ECOG PS- 1 Pain scale- 0   Review of systems- Review of Systems  Constitutional:  Positive for malaise/fatigue. Negative for chills, fever and weight loss.  HENT:  Negative for congestion, ear discharge and nosebleeds.   Eyes:  Negative for blurred vision.  Respiratory:  Negative for cough, hemoptysis, sputum production, shortness of breath and wheezing.   Cardiovascular:  Negative for chest pain, palpitations, orthopnea and claudication.  Gastrointestinal:  Negative for abdominal pain, blood in stool, constipation, diarrhea, heartburn, melena,  nausea and vomiting.  Genitourinary:  Negative for dysuria, flank pain, frequency, hematuria and urgency.  Musculoskeletal:  Negative for back pain, joint pain and myalgias.  Skin:  Negative for rash.  Neurological:  Negative for dizziness, tingling, focal weakness, seizures, weakness and headaches.  Endo/Heme/Allergies:  Does not bruise/bleed easily.  Psychiatric/Behavioral:  Negative for depression and suicidal ideas. The patient does not have insomnia.       No Known Allergies   Past Medical History:  Diagnosis Date   Hypertension      Past Surgical History:  Procedure Laterality Date   ESOPHAGOGASTRODUODENOSCOPY (EGD) WITH PROPOFOL  N/A 01/30/2022   Procedure: ESOPHAGOGASTRODUODENOSCOPY (EGD) WITH PROPOFOL ;  Surgeon: Marnee Sink, MD;  Location: [facility] ENDOSCOPY;  Service: Endoscopy;  Laterality: N/A;   IR THORACENTESIS ASP PLEURAL SPACE W/IMG GUIDE  10/27/2021    Social History   Socioeconomic History   Marital status: Married    Spouse name: Not on file   Number of children: Not on file   Years of education: Not on file   Highest education level: 11th grade  Occupational History   Not on file  Tobacco Use   Smoking status: Never    Passive exposure: Never   Smokeless tobacco: Never  Vaping Use   Vaping status: Never Used  Substance and Sexual Activity   Alcohol use: Not Currently    Comment: no alcohol the past 6 months.   Drug use: No   Sexual activity: Not on file  Other Topics Concern   Not on file  Social History Narrative   Not on file   Social Drivers of Health   Financial Resource Strain: Low Risk  (07/01/2023)   Overall Financial Resource Strain (CARDIA)  Difficulty of Paying Living Expenses: Not very hard  Food Insecurity: No Food Insecurity (07/01/2023)   Hunger Vital Sign    Worried About Running Out of Food in the Last Year: Never true    Ran Out of Food in the Last Year: Never true  Transportation Needs: No Transportation Needs (07/01/2023)    PRAPARE - Administrator, Civil Service (Medical): No    Lack of Transportation (Non-Medical): No  Physical Activity: Inactive (07/01/2023)   Exercise Vital Sign    Days of Exercise per Week: 0 days    Minutes of Exercise per Session: 40 min  Stress: No Stress Concern Present (07/01/2023)   [employer] of Occupational Health - Occupational Stress Questionnaire    Feeling of Stress : Not at all  Social Connections: Unknown (07/01/2023)   Social Connection and Isolation Panel [NHANES]    Frequency of Communication with Friends and Family: More than three times a week    Frequency of Social Gatherings with Friends and Family: Patient declined    Attends Religious Services: Patient declined    Database administrator or Organizations: No    Attends Engineer, structural: Not on file    Marital Status: Married  Recent Concern: Social Connections - Moderately Isolated (06/03/2023)   Social Connection and Isolation Panel [NHANES]    Frequency of Communication with Friends and Family: More than three times a week    Frequency of Social Gatherings with Friends and Family: More than three times a week    Attends Religious Services: Never    Database administrator or Organizations: No    Attends Engineer, structural: Not on file    Marital Status: Married  Catering manager Violence: Not At Risk (04/19/2022)   Humiliation, Afraid, Rape, and Kick questionnaire    Fear of Current or Ex-Partner: No    Emotionally Abused: No    Physically Abused: No    Sexually Abused: No    Family History  Problem Relation Age of Onset   Hypertension Mother    Colon cancer Mother    Hypertension Brother    Cancer Maternal Uncle    Cancer Maternal Uncle    Clotting disorder Neg Hx    Diabetes Neg Hx      Current Outpatient Medications:    Accu-Chek Softclix Lancets lancets, Use as directed up to four times daily, Disp: 200 each, Rfl: 0   acetaminophen  (TYLENOL ) 500  MG tablet, Take 500-1,000 mg by mouth every 8 (eight) hours as needed for mild pain (pain score 1-3)., Disp: , Rfl:    Blood Glucose Monitoring Suppl (ACCU-CHEK GUIDE) w/Device KIT, Use as directed up to four times daily, Disp: 1 kit, Rfl: 0   carvedilol  (COREG ) 6.25 MG tablet, Take 1 tablet (6.25 mg total) by mouth 2 (two) times daily with a meal., Disp: 180 tablet, Rfl: 0   folic acid  (FOLVITE ) 1 MG tablet, Take 1 tablet (1 mg total) by mouth daily., Disp: 90 tablet, Rfl: 3   furosemide  (LASIX ) 40 MG tablet, Take 1 tablet (40 mg total) by mouth daily., Disp: 90 tablet, Rfl: 0   gabapentin  (NEURONTIN ) 300 MG capsule, Take 1 capsule (300 mg total) by mouth at bedtime., Disp: 30 capsule, Rfl: 2   glucose blood (ACCU-CHEK GUIDE) test strip, Use as directed up to four times daily., Disp: 100 each, Rfl: 0   hydrALAZINE  (APRESOLINE ) 25 MG tablet, Take 1 tablet (25 mg total) by mouth 2 (two)  times daily., Disp: 180 tablet, Rfl: 0   ibuprofen  (ADVIL ,MOTRIN ) 200 MG tablet, Take 200-400 mg by mouth daily as needed (pain)., Disp: , Rfl:    Insulin  Glargine (BASAGLAR  KWIKPEN) 100 UNIT/ML, Inject 23 Units into the skin daily., Disp: 15 mL, Rfl: 1   Insulin  Pen Needle (TRUEPLUS 5-BEVEL PEN NEEDLES) 32G X 4 MM MISC, Use once daily, Disp: 100 each, Rfl: 0   Insulin  Pen Needle 32G X 4 MM MISC, Use once daily, Disp: 100 each, Rfl: 0   melatonin 5 MG TABS, Take 5 mg by mouth., Disp: , Rfl:    naproxen  (NAPROSYN ) 500 MG tablet, Take 1 tablet (500 mg total) by mouth 2 (two) times daily., Disp: 14 tablet, Rfl: 0   spironolactone  (ALDACTONE ) 100 MG tablet, Take 1 tablet (100 mg total) by mouth daily., Disp: 90 tablet, Rfl: 0  Physical exam:  Vitals:   10/25/23 1434  BP: 136/76  Pulse: (!) 57  Resp: 18  Temp: (!) 97 F (36.1 C)  TempSrc: Tympanic  SpO2: 100%  Weight: 175 lb 12.8 oz (79.7 kg)  Height: 5' 4.5" (1.638 m)   Physical Exam Cardiovascular:     Rate and Rhythm: Normal rate and regular rhythm.      Heart sounds: Normal heart sounds.  Pulmonary:     Effort: Pulmonary effort is normal.     Breath sounds: Normal breath sounds.  Abdominal:     General: Bowel sounds are normal.     Palpations: Abdomen is soft.  Musculoskeletal:     Right lower leg: No edema.     Left lower leg: No edema.  Skin:    General: Skin is warm and dry.  Neurological:     Mental Status: She is alert and oriented to person, place, and time.      I have personally reviewed labs listed below:    Latest Ref Rng & Units 10/25/2023    1:59 PM  CMP  Glucose 70 - 99 mg/dL 161   BUN 6 - 20 mg/dL 22   Creatinine 0.96 - 1.00 mg/dL 0.45   Sodium 409 - 811 mmol/L 132   Potassium 3.5 - 5.1 mmol/L 4.1   Chloride 98 - 111 mmol/L 99   CO2 22 - 32 mmol/L 27   Calcium 8.9 - 10.3 mg/dL 8.6   Total Protein 6.5 - 8.1 g/dL 6.5   Total Bilirubin 0.0 - 1.2 mg/dL 1.7   Alkaline Phos 38 - 126 U/L 111   AST 15 - 41 U/L 128   ALT 0 - 44 U/L 105       Latest Ref Rng & Units 10/25/2023    1:59 PM  CBC  WBC 4.0 - 10.5 K/uL 2.8   Hemoglobin 12.0 - 15.0 g/dL 91.4   Hematocrit 78.2 - 46.0 % 35.7   Platelets 150 - 400 K/uL 54      Assessment and plan- Patient is a 58 y.o. female for routine followup For following issues:  Pancytopenia: Patient has baseline pancytopenia with a white count that fluctuates between 2-4.  Neutrophils have remained more than 1.  Hemoglobin has been stable between 12-14.  Platelets over the last 1 year have been in the 50s.  This is all likely secondary to cirrhosis of the liver and I am not pursuing a bone marrow biopsy yet.  If there is any continued worsening of her pancytopenia we could consider getting a bone marrow biopsy at that time.  HCC surveillance: MRI abdomen with  and without contrast in October 2024 did not show any evidence of liver lesions despite her AFP being elevated at 80 from a prior value of 12.9.  Today her AFP is 34.3.  We will plan to get another MRI at this time and if it is  unremarkable I will go back to doing ultrasounds every 6 months   Visit Diagnosis 1. Other cirrhosis of liver (HCC)   2. Other pancytopenia (HCC)      Dr. Seretha Dance, MD, MPH [facility] [phone] 10/26/2023 11:47 AM

## 2023-10-28 ENCOUNTER — Other Ambulatory Visit: Payer: Self-pay | Admitting: Family

## 2023-10-28 DIAGNOSIS — E1165 Type 2 diabetes mellitus with hyperglycemia: Secondary | ICD-10-CM

## 2023-10-28 NOTE — Telephone Encounter (Signed)
 Copied from CRM [id number]. Topic: Clinical - Medication Refill >> Oct 28, 2023  4:28 PM Loreda Rodriguez T wrote: Most Recent Primary Care Visit:  Provider: Senaida Dama  Department: [facility]  Visit Type: OFFICE VISIT  Date: 09/30/2023  Medication: Insulin  Glargine (BASAGLAR  KWIKPEN) 100 UNIT/ML  Has the patient contacted their pharmacy? No (Agent: If no, request that the patient contact the pharmacy for the refill. If patient does not wish to contact the pharmacy document the reason why and proceed with request.) (Agent: If yes, when and what did the pharmacy advise?)  Is this the correct pharmacy for this prescription? Yes If no, delete pharmacy and type the correct one.  This is the patient's preferred pharmacy:  [facility] [address], [address] [place] [address] Phone: [phone] Fax: [phone]  Has the prescription been filled recently? Yes  Is the patient out of the medication? Yes  Has the patient been seen for an appointment in the last year OR does the patient have an upcoming appointment? Yes  Can we respond through MyChart? No  Agent: Please be advised that Rx refills may take up to 3 business days. We ask that you follow-up with your pharmacy.

## 2023-10-31 NOTE — Telephone Encounter (Signed)
 Too soon for refill.  Requested Prescriptions  Pending Prescriptions Disp Refills   Insulin  Glargine (BASAGLAR  KWIKPEN) 100 UNIT/ML 15 mL 1    Sig: Inject 23 Units into the skin daily.     Endocrinology:  Diabetes - Insulins Passed - 10/31/2023  9:00 AM      Passed - HBA1C is between 0 and 7.9 and within 180 days    HbA1c, POC (prediabetic range)  Date Value Ref Range Status  08/29/2022 6.2 5.7 - 6.4 % Final   HbA1c, POC (controlled diabetic range)  Date Value Ref Range Status  09/30/2023 7.1 (A) 0.0 - 7.0 % Final         Passed - Valid encounter within last 6 months    Recent Outpatient Visits           1 month ago Primary hypertension   [facility], [place], NP   4 months ago Primary hypertension   [place] [facility], Amy J, NP   4 months ago Diabetic eye exam [facility])   [facility] [facility], Amy J, NP   8 months ago Screening cholesterol level   [facility], Amy J, NP   11 months ago Primary hypertension   [place] [facility], Annalee Barren, NP

## 2023-11-01 ENCOUNTER — Other Ambulatory Visit: Payer: Self-pay

## 2023-11-06 ENCOUNTER — Other Ambulatory Visit: Payer: Self-pay | Admitting: Family

## 2023-11-06 DIAGNOSIS — M79671 Pain in right foot: Secondary | ICD-10-CM

## 2023-11-07 ENCOUNTER — Other Ambulatory Visit: Payer: Self-pay

## 2023-11-07 ENCOUNTER — Ambulatory Visit
Admission: RE | Admit: 2023-11-07 | Discharge: 2023-11-07 | Disposition: A | Discharge status: Home / Self Care | Payer: Self-pay | Source: Ambulatory Visit | Attending: Oncology | Admitting: Oncology

## 2023-11-07 DIAGNOSIS — K769 Liver disease, unspecified: Secondary | ICD-10-CM

## 2023-11-07 DIAGNOSIS — R772 Abnormality of alphafetoprotein: Secondary | ICD-10-CM

## 2023-11-07 DIAGNOSIS — K7469 Other cirrhosis of liver: Secondary | ICD-10-CM

## 2023-11-07 MED ORDER — GABAPENTIN 300 MG PO CAPS
300.0000 mg | ORAL_CAPSULE | Freq: Every day | ORAL | 0 refills | Status: AC
  Filled 2023-11-07: qty 90, 90d supply, fill #0

## 2023-11-07 MED ORDER — GADOBUTROL 1 MMOL/ML IV SOLN
7.5000 mL | Freq: Once | INTRAVENOUS | Status: AC | PRN
  Administered 2023-11-07: 7.5 mL via INTRAVENOUS

## 2023-11-07 NOTE — Telephone Encounter (Signed)
 Complete

## 2023-11-08 ENCOUNTER — Telehealth: Payer: Self-pay

## 2023-11-08 NOTE — Telephone Encounter (Signed)
 Patient reviewed my chart message: Last Read in MyChart 11/08/2023 11:59 AM by [name], [name]  No further follow up needed.

## 2023-11-08 NOTE — Telephone Encounter (Signed)
-----   Message from Avonne Boettcher sent at 11/07/2023  1:06 PM EDT ----- Please let her know her mri liver showed no liver lesions. Overall stable findings of cirrhosis

## 2023-11-08 NOTE — Telephone Encounter (Signed)
 Per Dr. Randy Buttery "Please let her know her mri liver showed no liver lesions. Overall stable findings of cirrhosis".  Outbound call to patient; voice message left.  On voicemail informed that a my chart message would be sent covering the details that would have been discussed.

## 2023-11-23 ENCOUNTER — Other Ambulatory Visit: Payer: Self-pay

## 2023-11-23 ENCOUNTER — Emergency Department (HOSPITAL_COMMUNITY)
Admission: EM | Admit: 2023-11-23 | Discharge: 2023-11-23 | Disposition: A | Discharge status: Home / Self Care | Payer: Self-pay | Attending: Emergency Medicine | Admitting: Emergency Medicine

## 2023-11-23 DIAGNOSIS — I10 Essential (primary) hypertension: Secondary | ICD-10-CM | POA: Insufficient documentation

## 2023-11-23 DIAGNOSIS — K047 Periapical abscess without sinus: Secondary | ICD-10-CM | POA: Insufficient documentation

## 2023-11-23 MED ORDER — AMOXICILLIN-POT CLAVULANATE 875-125 MG PO TABS: 1.0000 | ORAL_TABLET | Freq: Two times a day (BID) | ORAL | 0 refills | Status: AC

## 2023-11-23 MED ORDER — OXYCODONE HCL 5 MG PO TABS: 2.5000 mg | ORAL_TABLET | Freq: Four times a day (QID) | ORAL | 0 refills | Status: AC | PRN

## 2023-11-23 NOTE — Discharge Instructions (Addendum)
You have been seen by your caregiver because of dental pain.  SEEK MEDICAL ATTENTION IF: The exam and treatment you received today has been provided on an emergency basis only. This is not a substitute for complete medical or dental care. If your problem worsens or new symptoms (problems) appear, and you are unable to arrange prompt follow-up care with your dentist, call or return to this location. CALL YOUR DENTIST OR RETURN IMMEDIATELY IF you develop a fever, rash, difficulty breathing or swallowing, neck or facial swelling, or other potentially serious concerns.  

## 2023-11-23 NOTE — ED Provider Notes (Signed)
 History of Present Illness   Patient Identification Mary Anderson is a 58 y.o. female.  Patient information was obtained from patient and previous medical records. PDMP reviewed during this encounter. History/Exam limitations: none. Patient presented to the Emergency Department by private vehicle.  Chief Complaint  Dental Pain   Patient who presents with complaint of toothache. Onset of symptoms was abrupt starting a few hours ago. Pain woke her from sleep.Patient describes pain as throbbing. Pain severity now is 10 /10. The pain does not radiate. Patient has jaw swelling but denies fever, change in voice or difficulty swallowing. Pain is aggravated by movement and palpation. Pain is alleviated by nothing. The patient denies other complaints. Patient has not sought treatment by another care provider for this problem. Care prior to arrival consisted of acetaminophen , with no relief.   Past Medical History:  Diagnosis Date   Hypertension    Family History  Problem Relation Age of Onset   Hypertension Mother    Colon cancer Mother    Hypertension Brother    Cancer Maternal Uncle    Cancer Maternal Uncle    Clotting disorder Neg Hx    Diabetes Neg Hx    Scheduled Meds: Continuous Infusions: PRN Meds:  No Known Allergies Social History   Socioeconomic History   Marital status: Married    Spouse name: Not on file   Number of children: Not on file   Years of education: Not on file   Highest education level: 11th grade  Occupational History   Not on file  Tobacco Use   Smoking status: Never    Passive exposure: Never   Smokeless tobacco: Never  Vaping Use   Vaping status: Never Used  Substance and Sexual Activity   Alcohol use: Not Currently    Comment: no alcohol the past 6 months.   Drug use: No   Sexual activity: Not on file  Other Topics Concern   Not on file  Social History Narrative   Not on file   Social Drivers of Health   Financial Resource Strain: Low  Risk  (07/01/2023)   Overall Financial Resource Strain (CARDIA)    Difficulty of Paying Living Expenses: Not very hard  Food Insecurity: No Food Insecurity (07/01/2023)   Hunger Vital Sign    Worried About Running Out of Food in the Last Year: Never true    Ran Out of Food in the Last Year: Never true  Transportation Needs: No Transportation Needs (07/01/2023)   PRAPARE - Administrator, Civil Service (Medical): No    Lack of Transportation (Non-Medical): No  Physical Activity: Inactive (07/01/2023)   Exercise Vital Sign    Days of Exercise per Week: 0 days    Minutes of Exercise per Session: 40 min  Stress: No Stress Concern Present (07/01/2023)   [employer] of Occupational Health - Occupational Stress Questionnaire    Feeling of Stress : Not at all  Social Connections: Unknown (07/01/2023)   Social Connection and Isolation Panel [NHANES]    Frequency of Communication with Friends and Family: More than three times a week    Frequency of Social Gatherings with Friends and Family: Patient declined    Attends Religious Services: Patient declined    Database administrator or Organizations: No    Attends Engineer, structural: Not on file    Marital Status: Married  Recent Concern: Social Connections - Moderately Isolated (06/03/2023)   Social Connection and Isolation Panel [NHANES]  Frequency of Communication with Friends and Family: More than three times a week    Frequency of Social Gatherings with Friends and Family: More than three times a week    Attends Religious Services: Never    Database administrator or Organizations: No    Attends Engineer, structural: Not on file    Marital Status: Married  Catering manager Violence: Not At Risk (04/19/2022)   Humiliation, Afraid, Rape, and Kick questionnaire    Fear of Current or Ex-Partner: No    Emotionally Abused: No    Physically Abused: No    Sexually Abused: No   Review of Systems Pertinent  items are noted in HPI.   Physical Exam   BP 139/85 (BP Location: Right Arm)   Pulse 72   Temp 98.9 F (37.2 C) (Oral)   Resp 14   Ht 5\' 4"  (1.626 m)   Wt 72.6 kg   LMP 04/22/2011   SpO2 98%   BMI 27.46 kg/m  General appearance: alert and appears extremely uncomfortable holding her jaw which is obviously swollen on the right lower side Head: Normocephalic, without obvious abnormality, atraumatic, apparent swelling over the right lower mandible which does not cross the angle of the jawline Throat: Poor dentition throughout.  There is gingival erythema, she has dental caries noted above the region of swelling on her jaw, no palpable fluctuant abscess Lungs: Normal respiratory pattern and no distress Abdomen: No distention Skin: Warm and dry  ED Course   Studies: None indicated.  Records Reviewed: Old medical records.  Treatments: \Will discharge the patient with Augmentin  and oxycodone  for dental infection  Consultations: None  Disposition: Patient with dentalgia.  No abscess requiring immediate incision and drainage.  Exam not concerning for Ludwig's angina or pharyngeal abscess.  Pt instructed to follow-up with dentist.  Discussed return precautions. Pt safe for discharge.    Tama Fails, PA-C 11/23/23 1610    Lind Repine, MD 11/24/23 [phone]

## 2023-11-23 NOTE — ED Triage Notes (Signed)
 Pt came in via POV d/t Lower Rt sided dental pain. Pt woke this morning with it very swollen, rates her pain 10/10. Has been bothering her since 1800 last night.

## 2023-11-23 NOTE — ED Notes (Signed)
 Denies syncope, fever, NV, dizziness, bleeding or drainage. C/o pain and swelling only.

## 2023-11-29 ENCOUNTER — Other Ambulatory Visit: Payer: Self-pay

## 2023-12-11 ENCOUNTER — Encounter: Payer: Self-pay | Admitting: Family

## 2023-12-12 ENCOUNTER — Other Ambulatory Visit: Payer: Self-pay

## 2023-12-12 ENCOUNTER — Other Ambulatory Visit: Payer: Self-pay | Admitting: Family

## 2023-12-12 DIAGNOSIS — E1165 Type 2 diabetes mellitus with hyperglycemia: Secondary | ICD-10-CM

## 2023-12-12 NOTE — Telephone Encounter (Signed)
 Copied from CRM [id number]. Topic: Clinical - Medication Refill >> Dec 12, 2023  2:03 PM Yolanda T wrote: Medication:  Insulin  Glargine (BASAGLAR  KWIKPEN) 100 UNIT/ML   Has the patient contacted their pharmacy? No  This is the patient's preferred pharmacy:  [facility] [address] [place] [address] Phone: [phone] Fax: [phone]  Is this the correct pharmacy for this prescription? Yes  Has the prescription been filled recently? Yes  Is the patient out of the medication? Yes  Has the patient been seen for an appointment in the last year OR does the patient have an upcoming appointment? Yes  Can we respond through MyChart? Yes  Agent: Please be advised that Rx refills may take up to 3 business days. We ask that you follow-up with your pharmacy.

## 2023-12-13 ENCOUNTER — Other Ambulatory Visit: Payer: Self-pay

## 2023-12-13 MED ORDER — BASAGLAR KWIKPEN 100 UNIT/ML ~~LOC~~ SOPN
23.0000 [IU] | PEN_INJECTOR | Freq: Every day | SUBCUTANEOUS | 1 refills | Status: DC
  Filled 2023-12-18: qty 6, 26d supply, fill #0

## 2023-12-13 NOTE — Telephone Encounter (Signed)
 Requested Prescriptions  Pending Prescriptions Disp Refills   Insulin  Glargine (BASAGLAR  KWIKPEN) 100 UNIT/ML 15 mL 2    Sig: Inject 23 Units into the skin daily.     Endocrinology:  Diabetes - Insulins Passed - 12/13/2023  4:44 PM      Passed - HBA1C is between 0 and 7.9 and within 180 days    HbA1c, POC (prediabetic range)  Date Value Ref Range Status  08/29/2022 6.2 5.7 - 6.4 % Final   HbA1c, POC (controlled diabetic range)  Date Value Ref Range Status  09/30/2023 7.1 (A) 0.0 - 7.0 % Final         Passed - Valid encounter within last 6 months    Recent Outpatient Visits           2 months ago Primary hypertension   [facility], Amy J, NP   5 months ago Primary hypertension   [facility], Amy J, NP   6 months ago Diabetic eye exam [facility])   [facility], Amy J, NP   9 months ago Screening cholesterol level   [facility], Amy J, NP   1 year ago Primary hypertension   [facility], Annalee Barren, NP

## 2023-12-18 ENCOUNTER — Other Ambulatory Visit: Payer: Self-pay

## 2023-12-23 ENCOUNTER — Other Ambulatory Visit: Payer: Self-pay

## 2023-12-30 ENCOUNTER — Ambulatory Visit: Payer: Self-pay | Admitting: Family

## 2023-12-30 ENCOUNTER — Ambulatory Visit (INDEPENDENT_AMBULATORY_CARE_PROVIDER_SITE_OTHER): Payer: Self-pay | Admitting: Family

## 2023-12-30 ENCOUNTER — Encounter: Payer: Self-pay | Admitting: Family

## 2023-12-30 ENCOUNTER — Other Ambulatory Visit: Payer: Self-pay

## 2023-12-30 VITALS — BP 132/68 | HR 59 | Temp 98.0°F | Resp 16 | Ht 64.0 in | Wt 177.2 lb

## 2023-12-30 DIAGNOSIS — E119 Type 2 diabetes mellitus without complications: Secondary | ICD-10-CM

## 2023-12-30 DIAGNOSIS — I10 Essential (primary) hypertension: Secondary | ICD-10-CM

## 2023-12-30 DIAGNOSIS — E569 Vitamin deficiency, unspecified: Secondary | ICD-10-CM

## 2023-12-30 DIAGNOSIS — E1165 Type 2 diabetes mellitus with hyperglycemia: Secondary | ICD-10-CM

## 2023-12-30 DIAGNOSIS — Z794 Long term (current) use of insulin: Secondary | ICD-10-CM

## 2023-12-30 LAB — POCT GLYCOSYLATED HEMOGLOBIN (HGB A1C): Hemoglobin A1C: 9.5 % — AB (ref 4.0–5.6)

## 2023-12-30 MED ORDER — FUROSEMIDE 40 MG PO TABS
40.0000 mg | ORAL_TABLET | Freq: Every day | ORAL | 0 refills | Status: DC
  Filled 2023-12-30: qty 90, 90d supply, fill #0

## 2023-12-30 MED ORDER — BASAGLAR KWIKPEN 100 UNIT/ML ~~LOC~~ SOPN
28.0000 [IU] | PEN_INJECTOR | Freq: Every day | SUBCUTANEOUS | 1 refills | Status: DC
  Filled 2023-12-30: qty 15, 53d supply, fill #0
  Filled 2024-01-15: qty 9, 32d supply, fill #0

## 2023-12-30 MED ORDER — SPIRONOLACTONE 100 MG PO TABS
100.0000 mg | ORAL_TABLET | Freq: Every day | ORAL | 0 refills | Status: DC
  Filled 2023-12-30: qty 90, 90d supply, fill #0

## 2023-12-30 MED ORDER — BASAGLAR KWIKPEN 100 UNIT/ML ~~LOC~~ SOPN
23.0000 [IU] | PEN_INJECTOR | Freq: Every day | SUBCUTANEOUS | 1 refills | Status: DC
  Filled 2023-12-30: qty 15, 65d supply, fill #0

## 2023-12-30 MED ORDER — CARVEDILOL 6.25 MG PO TABS
6.2500 mg | ORAL_TABLET | Freq: Two times a day (BID) | ORAL | 0 refills | Status: DC
  Filled 2023-12-30: qty 180, 90d supply, fill #0

## 2023-12-30 MED ORDER — HYDRALAZINE HCL 25 MG PO TABS
25.0000 mg | ORAL_TABLET | Freq: Two times a day (BID) | ORAL | 0 refills | Status: DC
  Filled 2023-12-30: qty 180, 90d supply, fill #0

## 2023-12-30 MED ORDER — FOLIC ACID 1 MG PO TABS
1.0000 mg | ORAL_TABLET | Freq: Every day | ORAL | 0 refills | Status: AC
  Filled 2023-12-30: qty 90, 90d supply, fill #0

## 2023-12-30 NOTE — Progress Notes (Signed)
 3 month follow-up

## 2023-12-30 NOTE — Progress Notes (Signed)
 Patient ID: MARSHAYLA MITSCHKE, female    DOB: 11-29-1965  MRN: [id number]  CC: Chronic Conditions Follow-Up  Subjective: Mary Anderson is a 58 y.o. female who presents for chronic conditions follow-up.   Her concerns today include:  - Doing well on Carvedilol , Furosemide , Hydralazine , and Spironolactone , no issues/concerns. Home blood pressures at goal. She does not complain of red flag symptoms such as but not limited to chest pain, shortness of breath, worst headache of life, nausea/vomiting.  - Doing well on Insulin  Glargine, no issues/concerns. Home blood sugars 90's to low 100's. Denies red flag symptoms associated with diabetes.  - Due for diabetic eye exam.  - Doing well on Folic Acid , no issues/concerns.  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     amoxicillin -clavulanate (AUGMENTIN ) 875-125 MG tablet Take 1 tablet by mouth every 12 (twelve) hours. 14 tablet 0   Blood Glucose Monitoring Suppl (ACCU-CHEK GUIDE) w/Device KIT Use as directed up to four times daily 1 kit 0   gabapentin   (NEURONTIN ) 300 MG capsule Take 1 capsule (300 mg total) by mouth at bedtime. 90 capsule 0   glucose blood (ACCU-CHEK GUIDE) test strip Use as directed up to four times daily. 100 each 0   ibuprofen  (ADVIL ,MOTRIN ) 200 MG tablet Take 200-400 mg by mouth daily as needed (pain).     Insulin  Pen Needle (TRUEPLUS 5-BEVEL PEN NEEDLES) 32G X 4 MM MISC Use once daily 100 each 0   Insulin  Pen Needle 32G X 4 MM MISC Use once daily 100 each 0   melatonin 5 MG TABS Take 5 mg by mouth.     naproxen  (NAPROSYN ) 500 MG tablet Take 1 tablet (500 mg total) by mouth 2 (two) times daily. 14 tablet 0   oxyCODONE  (ROXICODONE ) 5 MG immediate release tablet Take 0.5-1 tablets (2.5-5 mg total) by mouth every 6 (six) hours as needed for severe pain (pain score 7-10). 8 tablet 0   No current facility-administered medications on file prior to visit.    No Known Allergies  Social History   Socioeconomic History   Marital status: Married    Spouse name: Not on file   Number of children: Not on file   Years of education: Not on file   Highest education level: 11th grade  Occupational History   Not on file  Tobacco Use   Smoking status: Never    Passive exposure: Never   Smokeless tobacco: Never  Vaping Use   Vaping status: Never Used  Substance and Sexual Activity   Alcohol use: Not Currently    Comment: no alcohol the past  6 months.   Drug use: No   Sexual activity: Yes    Birth control/protection: None  Other Topics Concern   Not on file  Social History Narrative   Not on file   Social Drivers of Health   Financial Resource Strain: Low Risk  (12/29/2023)   Overall Financial Resource Strain (CARDIA)    Difficulty of Paying Living Expenses: Not hard at all  Food Insecurity: No Food Insecurity (12/29/2023)   Hunger Vital Sign    Worried About Running Out of Food in the Last Year: Never true    Ran Out of Food in the Last Year: Never true  Transportation Needs: No Transportation Needs (12/29/2023)    PRAPARE - Administrator, Civil Service (Medical): No    Lack of Transportation (Non-Medical): No  Physical Activity: Insufficiently Active (12/29/2023)   Exercise Vital Sign    Days of Exercise per Week: 1 day    Minutes of Exercise per Session: 10 min  Stress: No Stress Concern Present (12/29/2023)   [employer] of Occupational Health - Occupational Stress Questionnaire    Feeling of Stress: Not at all  Social Connections: Moderately Isolated (12/29/2023)   Social Connection and Isolation Panel    Frequency of Communication with Friends and Family: More than three times a week    Frequency of Social Gatherings with Friends and Family: Once a week    Attends Religious Services: Never    Database administrator or Organizations: No    Attends Engineer, structural: Not on file    Marital Status: Married  Catering manager Violence: Not At Risk (04/19/2022)   Humiliation, Afraid, Rape, and Kick questionnaire    Fear of Current or Ex-Partner: No    Emotionally Abused: No    Physically Abused: No    Sexually Abused: No    Family History  Problem Relation Age of Onset   Hypertension Mother    Colon cancer Mother    Hypertension Brother    Cancer Maternal Uncle    Cancer Maternal Uncle    Clotting disorder Neg Hx    Diabetes Neg Hx     Past Surgical History:  Procedure Laterality Date   ESOPHAGOGASTRODUODENOSCOPY (EGD) WITH PROPOFOL  N/A 01/30/2022   Procedure: ESOPHAGOGASTRODUODENOSCOPY (EGD) WITH PROPOFOL ;  Surgeon: Jinny Carmine, MD;  Location: [facility];  Service: Endoscopy;  Laterality: N/A;   IR THORACENTESIS ASP PLEURAL SPACE W/IMG GUIDE  10/27/2021    ROS: Review of Systems Negative except as stated above  PHYSICAL EXAM: BP 132/68   Pulse (!) 59   Temp 98 F (36.7 C) (Oral)   Resp 16   Ht 5' 4 (1.626 m)   Wt 177 lb 3.2 oz (80.4 kg)   LMP 04/22/2011   SpO2 97%   BMI 30.42 kg/m   Physical Exam HENT:     Head: Normocephalic and  atraumatic.     Nose: Nose normal.     Mouth/Throat:     Mouth: Mucous membranes are moist.     Pharynx: Oropharynx is clear.   Eyes:     Extraocular Movements: Extraocular movements intact.     Conjunctiva/sclera: Conjunctivae normal.     Pupils: Pupils are equal, round, and reactive to light.    Cardiovascular:     Rate and Rhythm: Bradycardia present.     Pulses: Normal pulses.     Heart sounds: Normal heart sounds.  Pulmonary:     Effort: Pulmonary effort is normal.  Breath sounds: Normal breath sounds.   Musculoskeletal:        General: Normal range of motion.     Cervical back: Normal range of motion and neck supple.   Neurological:     General: No focal deficit present.     Mental Status: She is alert and oriented to person, place, and time.   Psychiatric:        Mood and Affect: Mood normal.        Behavior: Behavior normal.    ASSESSMENT AND PLAN: 1. Primary hypertension (Primary) - Continue Hydralazine , Spironolactone , Carvedilol , and Furosemide  as prescribed.  - Counseled on blood pressure goal of less than 130/80, low-sodium, DASH diet, medication compliance, and 150 minutes of moderate intensity exercise per week as tolerated. Counseled on medication adherence and adverse effects. - Follow-up with primary provider in 3 months or sooner if needed.  - hydrALAZINE  (APRESOLINE ) 25 MG tablet; Take 1 tablet (25 mg total) by mouth 2 (two) times daily.  Dispense: 180 tablet; Refill: 0 - spironolactone  (ALDACTONE ) 100 MG tablet; Take 1 tablet (100 mg total) by mouth daily.  Dispense: 90 tablet; Refill: 0 - carvedilol  (COREG ) 6.25 MG tablet; Take 1 tablet (6.25 mg total) by mouth 2 (two) times daily with a meal.  Dispense: 180 tablet; Refill: 0 - furosemide  (LASIX ) 40 MG tablet; Take 1 tablet (40 mg total) by mouth daily.  Dispense: 90 tablet; Refill: 0  2. Type 2 diabetes mellitus with hyperglycemia, with long-term current use of insulin  (HCC) - Continue Insulin   Glargine as prescribed.  - Hemoglobin A1c result pending. - Discussed the importance of healthy eating habits, low-carbohydrate diet, low-sugar diet, regular aerobic exercise (at least 150 minutes a week as tolerated) and medication compliance to achieve or maintain control of diabetes. Counseled on medication adherence/adverse effects.  - Follow-up with primary provider as scheduled.  - Insulin  Glargine (BASAGLAR  KWIKPEN) 100 UNIT/ML; Inject 23 Units into the skin daily.  Dispense: 15 mL; Refill: 1 - POCT glycosylated hemoglobin (Hb A1C)  3. Diabetic eye exam [facility]) - Referral to Ophthalmology for evaluation/management. - Ambulatory referral to Ophthalmology  4. Vitamin deficiency - Continue Folic Acid  as prescribed. Counseled on medication adherence/adverse effects.  - Follow-up with primary provider in 3 months or sooner if needed. - folic acid  (FOLVITE ) 1 MG tablet; Take 1 tablet (1 mg total) by mouth daily.  Dispense: 90 tablet; Refill: 0   Patient was given the opportunity to ask questions.  Patient verbalized understanding of the plan and was able to repeat key elements of the plan. Patient was given clear instructions to go to Emergency Department or return to medical center if symptoms don't improve, worsen, or new problems develop.The patient verbalized understanding.   Orders Placed This Encounter  Procedures   Ambulatory referral to Ophthalmology   POCT glycosylated hemoglobin (Hb A1C)     Requested Prescriptions   Signed Prescriptions Disp Refills   hydrALAZINE  (APRESOLINE ) 25 MG tablet 180 tablet 0    Sig: Take 1 tablet (25 mg total) by mouth 2 (two) times daily.   spironolactone  (ALDACTONE ) 100 MG tablet 90 tablet 0    Sig: Take 1 tablet (100 mg total) by mouth daily.   carvedilol  (COREG ) 6.25 MG tablet 180 tablet 0    Sig: Take 1 tablet (6.25 mg total) by mouth 2 (two) times daily with a meal.   furosemide  (LASIX ) 40 MG tablet 90 tablet 0    Sig: Take 1 tablet (40  mg total) by  mouth daily.   Insulin  Glargine (BASAGLAR  KWIKPEN) 100 UNIT/ML 15 mL 1    Sig: Inject 23 Units into the skin daily.   folic acid  (FOLVITE ) 1 MG tablet 90 tablet 0    Sig: Take 1 tablet (1 mg total) by mouth daily.    Return in about 3 months (around 03/31/2024) for Follow-Up or next available chronic conditions.  Greig JINNY Drones, NP

## 2024-01-02 ENCOUNTER — Other Ambulatory Visit: Payer: Self-pay

## 2024-01-15 ENCOUNTER — Other Ambulatory Visit: Payer: Self-pay

## 2024-01-24 ENCOUNTER — Emergency Department (HOSPITAL_COMMUNITY): Payer: Self-pay

## 2024-01-24 ENCOUNTER — Encounter (HOSPITAL_COMMUNITY): Payer: Self-pay

## 2024-01-24 ENCOUNTER — Emergency Department (HOSPITAL_COMMUNITY)
Admission: EM | Admit: 2024-01-24 | Discharge: 2024-01-25 | Disposition: A | Discharge status: Home / Self Care | Payer: Self-pay | Attending: Emergency Medicine | Admitting: Emergency Medicine

## 2024-01-24 ENCOUNTER — Other Ambulatory Visit: Payer: Self-pay

## 2024-01-24 DIAGNOSIS — R739 Hyperglycemia, unspecified: Secondary | ICD-10-CM

## 2024-01-24 DIAGNOSIS — E1165 Type 2 diabetes mellitus with hyperglycemia: Secondary | ICD-10-CM | POA: Insufficient documentation

## 2024-01-24 DIAGNOSIS — T675XXA Heat exhaustion, unspecified, initial encounter: Secondary | ICD-10-CM | POA: Insufficient documentation

## 2024-01-24 DIAGNOSIS — I10 Essential (primary) hypertension: Secondary | ICD-10-CM | POA: Insufficient documentation

## 2024-01-24 DIAGNOSIS — R519 Headache, unspecified: Secondary | ICD-10-CM | POA: Insufficient documentation

## 2024-01-24 DIAGNOSIS — Z794 Long term (current) use of insulin: Secondary | ICD-10-CM | POA: Insufficient documentation

## 2024-01-24 LAB — CBC
HCT: 36.8 % (ref 36.0–46.0)
Hemoglobin: 13 g/dL (ref 12.0–15.0)
MCH: 36 pg — ABNORMAL HIGH (ref 26.0–34.0)
MCHC: 35.3 g/dL (ref 30.0–36.0)
MCV: 101.9 fL — ABNORMAL HIGH (ref 80.0–100.0)
Platelets: 57 K/uL — ABNORMAL LOW (ref 150–400)
RBC: 3.61 MIL/uL — ABNORMAL LOW (ref 3.87–5.11)
RDW: 12.8 % (ref 11.5–15.5)
WBC: 6.6 K/uL (ref 4.0–10.5)
nRBC: 0 % (ref 0.0–0.2)

## 2024-01-24 LAB — CBG MONITORING, ED
Glucose-Capillary: 500 mg/dL — ABNORMAL HIGH (ref 70–99)
Glucose-Capillary: 400 mg/dL — ABNORMAL HIGH (ref 70–99)

## 2024-01-24 LAB — COMPREHENSIVE METABOLIC PANEL WITH GFR
ALT: 77 U/L — ABNORMAL HIGH (ref 0–44)
AST: 81 U/L — ABNORMAL HIGH (ref 15–41)
Albumin: 2.9 g/dL — ABNORMAL LOW (ref 3.5–5.0)
Alkaline Phosphatase: 105 U/L (ref 38–126)
Anion gap: 7 (ref 5–15)
BUN: 19 mg/dL (ref 6–20)
CO2: 25 mmol/L (ref 22–32)
Calcium: 8.7 mg/dL — ABNORMAL LOW (ref 8.9–10.3)
Chloride: 90 mmol/L — ABNORMAL LOW (ref 98–111)
Creatinine, Ser: 1.28 mg/dL — ABNORMAL HIGH (ref 0.44–1.00)
GFR, Estimated: 49 mL/min — ABNORMAL LOW (ref >60)
Glucose, Bld: 577 mg/dL (ref 70–99)
Potassium: 4.2 mmol/L (ref 3.5–5.1)
Sodium: 122 mmol/L — ABNORMAL LOW (ref 135–145)
Total Bilirubin: 3.6 mg/dL — ABNORMAL HIGH (ref 0.0–1.2)
Total Protein: 6.8 g/dL (ref 6.5–8.1)

## 2024-01-24 LAB — RESP PANEL BY RT-PCR (RSV, FLU A&B, COVID)  RVPGX2
Influenza A by PCR: NEGATIVE
Influenza B by PCR: NEGATIVE
Resp Syncytial Virus by PCR: NEGATIVE
SARS Coronavirus 2 by RT PCR: NEGATIVE

## 2024-01-24 MED ORDER — INSULIN ASPART 100 UNIT/ML IJ SOLN
10.0000 [IU] | Freq: Once | INTRAMUSCULAR | Status: AC
  Administered 2024-01-24: 10 [IU] via SUBCUTANEOUS

## 2024-01-24 MED ORDER — DIPHENHYDRAMINE HCL 50 MG/ML IJ SOLN
25.0000 mg | Freq: Once | INTRAMUSCULAR | Status: AC
  Administered 2024-01-24: 25 mg via INTRAVENOUS
  Filled 2024-01-24: qty 1

## 2024-01-24 MED ORDER — METOCLOPRAMIDE HCL 5 MG/ML IJ SOLN
10.0000 mg | Freq: Once | INTRAMUSCULAR | Status: AC
  Administered 2024-01-24: 10 mg via INTRAVENOUS
  Filled 2024-01-24: qty 2

## 2024-01-24 MED ORDER — SODIUM CHLORIDE 0.9 % IV BOLUS
2000.0000 mL | Freq: Once | INTRAVENOUS | Status: AC
  Administered 2024-01-24: 2000 mL via INTRAVENOUS

## 2024-01-24 MED ORDER — SODIUM CHLORIDE 0.9 % IV BOLUS: 1000.0000 mL | Freq: Once | INTRAVENOUS | Status: DC

## 2024-01-24 NOTE — ED Provider Notes (Signed)
 [place] EMERGENCY DEPARTMENT AT [facility] Provider Note   CSN: [id number] Arrival date & time: 01/24/24  1627     Patient presents with: Headache   Mary Anderson is a 58 y.o. female history of diabetes, hypertension here presenting with headache and frequent urination.  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  She states that the [facility] broke in the building and the building is often 90 degrees.  Patient states that she just has persistent headache for several days.  Patient felt like she may be overheated.  Patient also has been urinating frequently.  She states that she has diabetes and uses insulin  as prescribed but does not check her sugar regularly.  She states that her sugar was normal several days ago   The history is provided by the patient.       Prior to Admission medications   Medication Sig Start Date End Date Taking? Authorizing Provider  Accu-Chek Softclix Lancets lancets Use as directed up to four times daily 04/19/22   Jinwala, Sagar H, MD  acetaminophen  (TYLENOL ) 500 MG tablet Take 500-1,000 mg by mouth every 8 (eight) hours as needed for mild pain (pain score 1-3).    [provider]  amoxicillin -clavulanate (AUGMENTIN ) 875-125 MG tablet Take 1 tablet by mouth every 12 (twelve) hours. 11/23/23   Harris, Abigail, PA-C  Blood Glucose Monitoring Suppl (ACCU-CHEK GUIDE) w/Device KIT Use as directed up to four times daily 04/19/22   Jinwala, Sagar H, MD  carvedilol  (COREG ) 6.25 MG tablet Take 1 tablet (6.25 mg total) by mouth 2 (two) times daily with a meal. 12/30/23   Lorren Greig PARAS, NP  folic acid  (FOLVITE ) 1 MG tablet Take 1 tablet (1 mg total) by mouth daily. 12/30/23   Lorren Greig PARAS, NP  furosemide  (LASIX ) 40 MG tablet Take 1 tablet (40 mg total) by mouth daily. 12/30/23   Lorren Greig PARAS, NP  gabapentin  (NEURONTIN ) 300 MG capsule Take 1 capsule (300 mg total) by mouth at bedtime. 11/07/23   Lorren Greig PARAS, NP   glucose blood (ACCU-CHEK GUIDE) test strip Use as directed up to four times daily. 11/29/22   Lorren Greig PARAS, NP  hydrALAZINE  (APRESOLINE ) 25 MG tablet Take 1 tablet (25 mg total) by mouth 2 (two) times daily. 12/30/23   Lorren Greig PARAS, NP  ibuprofen  (ADVIL ,MOTRIN ) 200 MG tablet Take 200-400 mg by mouth daily as needed (pain).    [provider]  Insulin  Glargine (BASAGLAR  KWIKPEN) 100 UNIT/ML Inject 28 Units into the skin daily. 12/30/23   Lorren Greig PARAS, NP  Insulin  Pen Needle (TRUEPLUS 5-BEVEL PEN NEEDLES) 32G X 4 MM MISC Use once daily 09/23/23   Lorren Greig PARAS, NP  Insulin  Pen Needle 32G X 4 MM MISC Use once daily 07/02/23   Lorren Greig PARAS, NP  melatonin 5 MG TABS Take 5 mg by mouth.    [provider]  naproxen  (NAPROSYN ) 500 MG tablet Take 1 tablet (500 mg total) by mouth 2 (two) times daily. 03/26/23   Small, Brooke L, PA  oxyCODONE  (ROXICODONE ) 5 MG immediate release tablet Take 0.5-1 tablets (2.5-5 mg total) by mouth every 6 (six) hours as needed for severe pain (pain score 7-10). 11/23/23   Harris, Abigail, PA-C  spironolactone  (ALDACTONE ) 100 MG tablet Take 1 tablet (100 mg total) by mouth daily. 12/30/23   Lorren Greig PARAS, NP    Allergies: Patient has no known allergies.  Review of Systems  Neurological:  Positive for headaches.  All other systems reviewed and are negative.   Updated Vital Signs BP 138/80 (BP Location: Right Arm)   Pulse 77   Temp 98.7 F (37.1 C)   Resp 16   Ht 5' 4 (1.626 m)   Wt 74.8 kg   LMP 04/22/2011   SpO2 95%   BMI 28.32 kg/m   Physical Exam Vitals and nursing note reviewed.  Constitutional:      Comments: Slightly dehydrated  HENT:     Head: Normocephalic.     Mouth/Throat:     Mouth: Mucous membranes are moist.  Eyes:     Extraocular Movements: Extraocular movements intact.     Pupils: Pupils are equal, round, and reactive to light.  Cardiovascular:     Rate and Rhythm: Normal rate and regular rhythm.     Heart  sounds: Normal heart sounds.  Pulmonary:     Effort: Pulmonary effort is normal.     Breath sounds: Normal breath sounds.  Abdominal:     General: Bowel sounds are normal.     Palpations: Abdomen is soft.  Musculoskeletal:        General: Normal range of motion.     Cervical back: Normal range of motion and neck supple.  Skin:    General: Skin is warm.  Neurological:     Mental Status: She is alert and oriented to person, place, and time.     Cranial Nerves: No cranial nerve deficit, dysarthria or facial asymmetry.     Sensory: No sensory deficit.  Psychiatric:        Mood and Affect: Mood normal.        Behavior: Behavior normal.     (all labs ordered are listed, but only abnormal results are displayed) Labs Reviewed  CBC - Abnormal; Notable for the following components:      Result Value   RBC 3.61 (*)    MCV 101.9 (*)    MCH 36.0 (*)    Platelets 57 (*)    All other components within normal limits  COMPREHENSIVE METABOLIC PANEL WITH GFR - Abnormal; Notable for the following components:   Sodium 122 (*)    Chloride 90 (*)    Glucose, Bld 577 (*)    Creatinine, Ser 1.28 (*)    Calcium 8.7 (*)    Albumin  2.9 (*)    AST 81 (*)    ALT 77 (*)    Total Bilirubin 3.6 (*)    GFR, Estimated 49 (*)    All other components within normal limits  RESP PANEL BY RT-PCR (RSV, FLU A&B, COVID)  RVPGX2  CBG MONITORING, ED    EKG: None  Radiology: CT Head Wo Contrast Result Date: 01/24/2024 CLINICAL DATA:  Headache EXAM: CT HEAD WITHOUT CONTRAST TECHNIQUE: Contiguous axial images were obtained from the base of the skull through the vertex without intravenous contrast. RADIATION DOSE REDUCTION: This exam was performed according to the departmental dose-optimization program which includes automated exposure control, adjustment of the mA and/or kV according to patient size and/or use of iterative reconstruction technique. COMPARISON:  None Available. FINDINGS: Brain: No acute  intracranial abnormality. Specifically, no hemorrhage, hydrocephalus, mass lesion, acute infarction, or significant intracranial injury. Vascular: No hyperdense vessel or unexpected calcification. Skull: No acute calvarial abnormality. Sinuses/Orbits: Air-fluid level in the sphenoid sinus. Remainder the paranasal sinuses and mastoids clear. Orbital soft tissues unremarkable. Other: None IMPRESSION: No acute intracranial abnormality. Acute sphenoid sinusitis. Electronically  Signed   By: Franky Crease M.D.   On: 01/24/2024 17:26     Procedures   Medications Ordered in the ED  insulin  aspart (novoLOG ) injection 10 Units (has no administration in time range)  metoCLOPramide (REGLAN) injection 10 mg (has no administration in time range)  diphenhydrAMINE (BENADRYL) injection 25 mg (has no administration in time range)  sodium chloride  0.9 % bolus 2,000 mL (has no administration in time range)                                    Medical Decision Making SHIRON WHETSEL is a 58 y.o. female who presenting with headache and hyperglycemia.  Patient's blood glucose was 500 on arrival.  Patient also has been in the hot building as a security guard.  Consider heat exhaustion versus dehydration from hyperglycemia.  Low suspicion for DKA.  Also consider intracranial mass as well.  Will get CT head and labs.  Will hydrate patient and reassess.  10:27 PM I reviewed patient's labs and sodium is 122 with glucose of 577.  Anion gap is 7 and corrected sodium is 133.  CT head unremarkable.  Ordered 2 L bolus and 10 units of insulin .  11:26 PM Glucose down to 400.  Patient also received migraine cocktail and felt better.  Patient's hyperglycemia can be from dehydration or heat exhaustion or uncontrolled diabetes.  Of note bilirubin slightly elevated at 3.6 but abdomen is nontender.  At this point patient stable for discharge  Problems Addressed: Heat exhaustion, initial encounter: acute illness or  injury Hyperglycemia: acute illness or injury Nonintractable headache, unspecified chronicity pattern, unspecified headache type: acute illness or injury  Amount and/or Complexity of Data Reviewed Labs: ordered. Decision-making details documented in ED Course. Radiology: ordered and independent interpretation performed. Decision-making details documented in ED Course.  Risk Prescription drug management.     Final diagnoses:  None    ED Discharge Orders     None          Patt Alm Macho, MD 01/24/24 [phone]

## 2024-01-24 NOTE — ED Provider Triage Note (Signed)
 Emergency Medicine Provider Triage Evaluation Note  Mary Anderson , a 58 y.o. female  was evaluated in triage.  Pt complains of HA started 1 week ago, no injury trauma or fall, wraps around whole head, no history of similar. Reports OTC medications, but no improvement. Works in hot environment at work and that is when it gets worse. Does note associated dry cough. No neck pain, neck rigidity.    Review of Systems  Positive: HA Negative: Fever  Physical Exam  BP 138/80 (BP Location: Right Arm)   Pulse 77   Temp 98.7 F (37.1 C)   Resp 16   Ht 5' 4 (1.626 m)   Wt 74.8 kg   LMP 04/22/2011   SpO2 95%   BMI 28.32 kg/m  Gen:   Awake, no distress   Resp:  Normal effort  MSK:   Moves extremities without difficulty  Other:  No focal deficit  Medical Decision Making  Medically screening exam initiated at 4:43 PM.  Appropriate orders placed.  Mary Anderson was informed that the remainder of the evaluation will be completed by another provider, this initial triage assessment does not replace that evaluation, and the importance of remaining in the ED until their evaluation is complete.     Shermon Warren SAILOR, [place] 01/24/24 1644

## 2024-01-24 NOTE — Discharge Instructions (Addendum)
 As we discussed, you likely have heat exhaustion causing dehydration  Your sugar is elevated as well.  Please stay hydrated and use your insulin  as prescribed  Please rest this weekend and stay out of the heat  You need to follow-up with your doctor as well  Return to ER if you have worse headache or vomiting or passing out or glucose greater than 600

## 2024-01-24 NOTE — ED Triage Notes (Signed)
 Pt to ED via POV c/o HA x 2 weeks, reports no alleviating or aggravating factors. Also reports nausea and generalized weakness

## 2024-01-27 ENCOUNTER — Emergency Department (HOSPITAL_COMMUNITY)
Admission: EM | Admit: 2024-01-27 | Discharge: 2024-01-27 | Disposition: A | Discharge status: Home / Self Care | Payer: Self-pay

## 2024-01-27 ENCOUNTER — Emergency Department (HOSPITAL_COMMUNITY): Payer: Self-pay

## 2024-01-27 ENCOUNTER — Other Ambulatory Visit: Payer: Self-pay

## 2024-01-27 ENCOUNTER — Encounter (HOSPITAL_COMMUNITY): Payer: Self-pay

## 2024-01-27 ENCOUNTER — Other Ambulatory Visit (HOSPITAL_COMMUNITY): Payer: Self-pay

## 2024-01-27 ENCOUNTER — Ambulatory Visit: Payer: Self-pay

## 2024-01-27 DIAGNOSIS — Z794 Long term (current) use of insulin: Secondary | ICD-10-CM | POA: Insufficient documentation

## 2024-01-27 DIAGNOSIS — E1165 Type 2 diabetes mellitus with hyperglycemia: Secondary | ICD-10-CM | POA: Insufficient documentation

## 2024-01-27 DIAGNOSIS — R739 Hyperglycemia, unspecified: Secondary | ICD-10-CM

## 2024-01-27 HISTORY — DX: Type 2 diabetes mellitus without complications: E11.9

## 2024-01-27 LAB — CBC
HCT: 37.1 % (ref 36.0–46.0)
Hemoglobin: 13.1 g/dL (ref 12.0–15.0)
MCH: 36.6 pg — ABNORMAL HIGH (ref 26.0–34.0)
MCHC: 35.3 g/dL (ref 30.0–36.0)
MCV: 103.6 fL — ABNORMAL HIGH (ref 80.0–100.0)
Platelets: 56 K/uL — ABNORMAL LOW (ref 150–400)
RBC: 3.58 MIL/uL — ABNORMAL LOW (ref 3.87–5.11)
RDW: 13.2 % (ref 11.5–15.5)
WBC: 3 K/uL — ABNORMAL LOW (ref 4.0–10.5)
nRBC: 0 % (ref 0.0–0.2)

## 2024-01-27 LAB — BASIC METABOLIC PANEL WITH GFR
Anion gap: 7 (ref 5–15)
BUN: 8 mg/dL (ref 6–20)
CO2: 26 mmol/L (ref 22–32)
Calcium: 8.6 mg/dL — ABNORMAL LOW (ref 8.9–10.3)
Chloride: 97 mmol/L — ABNORMAL LOW (ref 98–111)
Creatinine, Ser: 0.94 mg/dL (ref 0.44–1.00)
GFR, Estimated: >60 mL/min (ref >60)
Glucose, Bld: 469 mg/dL — ABNORMAL HIGH (ref 70–99)
Potassium: 4.2 mmol/L (ref 3.5–5.1)
Sodium: 130 mmol/L — ABNORMAL LOW (ref 135–145)

## 2024-01-27 LAB — I-STAT CHEM 8, ED
BUN: 10 mg/dL (ref 6–20)
Calcium, Ion: 1.05 mmol/L — ABNORMAL LOW (ref 1.15–1.40)
Chloride: 96 mmol/L — ABNORMAL LOW (ref 98–111)
Creatinine, Ser: 0.9 mg/dL (ref 0.44–1.00)
Glucose, Bld: 473 mg/dL — ABNORMAL HIGH (ref 70–99)
HCT: 39 % (ref 36.0–46.0)
Hemoglobin: 13.3 g/dL (ref 12.0–15.0)
Potassium: 4.2 mmol/L (ref 3.5–5.1)
Sodium: 132 mmol/L — ABNORMAL LOW (ref 135–145)
TCO2: 26 mmol/L (ref 22–32)

## 2024-01-27 LAB — I-STAT VENOUS BLOOD GAS, ED
Acid-Base Excess: 4 mmol/L — ABNORMAL HIGH (ref 0.0–2.0)
Bicarbonate: 29 mmol/L — ABNORMAL HIGH (ref 20.0–28.0)
Calcium, Ion: 1.15 mmol/L (ref 1.15–1.40)
HCT: 39 % (ref 36.0–46.0)
Hemoglobin: 13.3 g/dL (ref 12.0–15.0)
O2 Saturation: 46 %
Potassium: 4.3 mmol/L (ref 3.5–5.1)
Sodium: 133 mmol/L — ABNORMAL LOW (ref 135–145)
TCO2: 30 mmol/L (ref 22–32)
pCO2, Ven: 42.8 mmHg — ABNORMAL LOW (ref 44–60)
pH, Ven: 7.439 — ABNORMAL HIGH (ref 7.25–7.43)
pO2, Ven: 25 mmHg — CL (ref 32–45)

## 2024-01-27 LAB — URINALYSIS, ROUTINE W REFLEX MICROSCOPIC
Bilirubin Urine: NEGATIVE
Glucose, UA: >=500 mg/dL — AB
Ketones, ur: NEGATIVE mg/dL
Leukocytes,Ua: NEGATIVE
Nitrite: NEGATIVE
Protein, ur: NEGATIVE mg/dL
Specific Gravity, Urine: 1.008 (ref 1.005–1.030)
pH: 6 (ref 5.0–8.0)

## 2024-01-27 LAB — HEPATIC FUNCTION PANEL
ALT: 75 U/L — ABNORMAL HIGH (ref 0–44)
AST: 94 U/L — ABNORMAL HIGH (ref 15–41)
Albumin: 2.7 g/dL — ABNORMAL LOW (ref 3.5–5.0)
Alkaline Phosphatase: 100 U/L (ref 38–126)
Bilirubin, Direct: 1.5 mg/dL — ABNORMAL HIGH (ref 0.0–0.2)
Indirect Bilirubin: 2 mg/dL — ABNORMAL HIGH (ref 0.3–0.9)
Total Bilirubin: 3.5 mg/dL — ABNORMAL HIGH (ref 0.0–1.2)
Total Protein: 6.6 g/dL (ref 6.5–8.1)

## 2024-01-27 LAB — CBG MONITORING, ED
Glucose-Capillary: 452 mg/dL — ABNORMAL HIGH (ref 70–99)
Glucose-Capillary: 453 mg/dL — ABNORMAL HIGH (ref 70–99)
Glucose-Capillary: 455 mg/dL — ABNORMAL HIGH (ref 70–99)
Glucose-Capillary: 378 mg/dL — ABNORMAL HIGH (ref 70–99)
Glucose-Capillary: 449 mg/dL — ABNORMAL HIGH (ref 70–99)

## 2024-01-27 MED ORDER — INSULIN LISPRO (1 UNIT DIAL) 100 UNIT/ML (KWIKPEN)
0.0000 [IU] | PEN_INJECTOR | Freq: Three times a day (TID) | SUBCUTANEOUS | 11 refills | Status: AC
  Filled 2024-01-27: qty 6, 22d supply, fill #0

## 2024-01-27 MED ORDER — INSULIN ASPART 100 UNIT/ML IJ SOLN
10.0000 [IU] | Freq: Once | INTRAMUSCULAR | Status: AC
  Administered 2024-01-27: 10 [IU] via SUBCUTANEOUS

## 2024-01-27 MED ORDER — LACTATED RINGERS IV BOLUS
1000.0000 mL | Freq: Once | INTRAVENOUS | Status: AC
  Administered 2024-01-27: 1000 mL via INTRAVENOUS

## 2024-01-27 MED ORDER — ACETAMINOPHEN 500 MG PO TABS
1000.0000 mg | ORAL_TABLET | Freq: Once | ORAL | Status: AC
  Administered 2024-01-27: 1000 mg via ORAL
  Filled 2024-01-27: qty 2

## 2024-01-27 MED ORDER — IOHEXOL 350 MG/ML SOLN
75.0000 mL | Freq: Once | INTRAVENOUS | Status: AC | PRN
  Administered 2024-01-27: 75 mL via INTRAVENOUS

## 2024-01-27 MED ORDER — DIPHENHYDRAMINE HCL 25 MG PO CAPS
25.0000 mg | ORAL_CAPSULE | Freq: Once | ORAL | Status: AC
  Administered 2024-01-27: 25 mg via ORAL
  Filled 2024-01-27: qty 1

## 2024-01-27 MED ORDER — INSULIN PEN NEEDLE 32G X 4 MM MISC
1.0000 | Freq: Three times a day (TID) | 0 refills | Status: AC
  Filled 2024-01-27: qty 100, 33d supply, fill #0

## 2024-01-27 MED ORDER — METOCLOPRAMIDE HCL 5 MG/ML IJ SOLN
10.0000 mg | Freq: Once | INTRAMUSCULAR | Status: AC
  Administered 2024-01-27: 10 mg via INTRAVENOUS
  Filled 2024-01-27: qty 2

## 2024-01-27 MED ORDER — BASAGLAR KWIKPEN 100 UNIT/ML ~~LOC~~ SOPN
34.0000 [IU] | PEN_INJECTOR | Freq: Every day | SUBCUTANEOUS | 1 refills | Status: DC
  Filled 2024-01-27: qty 15, 44d supply, fill #0

## 2024-01-27 NOTE — Discharge Instructions (Addendum)
 Please follow-up with your primary doctor.  We are modifying your insulin , please monitor your blood sugars closely.  Return if you develop fevers, chills, headache persists, unilateral weakness, facial droop, difficulty finding words, chest pain, shortness of breath inability eat or drink due to nausea vomiting, severe abdominal pain or any new or worsening symptoms that are concerning to you.

## 2024-01-27 NOTE — Inpatient Diabetes Management (Signed)
 Inpatient Diabetes Program Recommendations  AACE/ADA: New Consensus Statement on Inpatient Glycemic Control   Target Ranges:  Prepandial:   less than 140 mg/dL      Peak postprandial:   less than 180 mg/dL (1-2 hours)      Critically ill patients:  140 - 180 mg/dL    Latest Reference Range & Units 01/27/24 10:06 01/27/24 11:56 01/27/24 13:22 01/27/24 14:00 01/27/24 14:23  Glucose-Capillary 70 - 99 mg/dL 547 (H) 546 (H) 544 (H) 449 (H) 378 (H)    Latest Reference Range & Units 12/30/23 09:29  Hemoglobin A1C 4.0 - 5.6 % -  9.5 ! Pend   Review of Glycemic Control  Diabetes history: DM2 Outpatient Diabetes medications: Basaglar  28 units QAM Current orders for Inpatient glycemic control: None  Inpatient Diabetes Program Recommendations:    Outpatient DM: Would recommend to increase Basaglar  to 34 units daily and prescribe Humalog  0-9 units TID with meals for correction scale.   CBG 120 mg/dl or less   0 units of insulin  CBG 121-150                 1 unit CBG 151-200                  2 units CBG 201-250                  3 units CBG 251-300                  5 units CBG 301-350                  7 units CBG 351 or higher          9 units Patient will need Rx for Humalog  Kwikpens [phone]) and insulin  pen needles (#[id number]).  NOTE: Spoke with patient at bedside in ED about diabetes and home regimen for diabetes control. Patient reports being followed by PCP for diabetes management; has no insurance but gets medications from [facility] and [employer].  Patient reports she is taking Basaglar  28 units QAM at 6:30 am.  Patient reports she starts work at 7:00 am daily.  She reports that her CBGs had been running in low 100's mg/dl but it has been very high recently, was in 400's today prior to coming to the hospital.  Patient reports that she is taking insulin  consistently and she is sure to rotate injection sites and stays away from any knots or hardened areas; she keeps insulin  in the  refrigerator.  Reviewed A1C results (9.5% on 12/30/23); discussed glucose and A1C goals. Discussed importance of checking CBGs and maintaining good CBG control to prevent long-term and short-term complications. Explained how hyperglycemia leads to damage within blood vessels which lead to the common complications seen with uncontrolled diabetes. Stressed to the patient the importance of improving glycemic control to prevent further complications from uncontrolled diabetes. Patient has never used short acting insulin  or correction scale but would be open to using it if prescribed. Discussed Novolog /Humalog  insulin  and how it works. Discussed how to use correction scale.  Will see which insulin  [facility] pharmacy has in stock.  Asked patient to check glucose 4 times a day, to reach out to her PCP if glucose continues to be 250 mg/dl or higher, or if she has any issues with hypoglycemia. Patient reports she has a follow up appointment with PCP on 02/13/24.   Reviewed hypoglycemia along with treatment.  Patient verbalized understanding of information discussed and reports no  further questions at this time related to diabetes.  Thanks, Earnie Gainer, RN, MSN, CDE Diabetes Coordinator Inpatient Diabetes Program [phone] (Team Pager)

## 2024-01-27 NOTE — ED Notes (Signed)
 Paper work reviewed with pt. No questions or concerns at this time. Pt is leaving for lobby for home.

## 2024-01-27 NOTE — ED Triage Notes (Signed)
 Pt arrives POV c/o HA, decreased appetite, and hyperglycemia. Pt also has blurry vision in both eyes. She was seen for same on Friday but was told to come back if nothing improved. Pt also called her PCP and was told to come here.

## 2024-01-27 NOTE — ED Notes (Addendum)
 Young DO made aware of elevated CBG (455) post insulin  via secure chat.

## 2024-01-27 NOTE — ED Provider Notes (Signed)
 [place] EMERGENCY DEPARTMENT AT [facility] Provider Note   CSN: [id number] Arrival date & time: 01/27/24  9050     Patient presents with: Hyperglycemia   Yelina C Lesko is a 58 y.o. female.   58 year old female presenting emergency department for hyperglycemia.  Blood sugars in the upper 400s 500s.  Was seen several days ago for headache and for same.  She has been taking her insulin , but not having control.  She notes that she continues to have intermittent headache as well.  Headache now in the back of the head.  Notes some blurred vision, but no vision loss.  No facial droop, global weakness, aphasia, balance disturbances.  Moving neck freely.  No fevers.  No abdominal pain no nausea vomiting.   Hyperglycemia      Prior to Admission medications   Medication Sig Start Date End Date Taking? Authorizing Provider  insulin  lispro (HUMALOG  KWIKPEN) 100 UNIT/ML KwikPen Inject 0-9 Units into the skin with breakfast, with lunch, and with evening meal. CBG 120 mg/dl or less 0 units of insulin  CBG 121-150 1 unit CBG 151-200 2 units CBG 201-250 3 units CBG 251-300 5 units CBG 301-350 7 units CBG 351 or higher 9 units 01/27/24  Yes Maykel Reitter J, DO  Insulin  Pen Needle 32G X 4 MM MISC 1 Needle by Does not apply route with breakfast, with lunch, and with evening meal. 01/27/24  Yes Neysa Caron PARAS, DO  Accu-Chek Softclix Lancets lancets Use as directed up to four times daily 04/19/22   Jinwala, Sagar H, MD  acetaminophen  (TYLENOL ) 500 MG tablet Take 500-1,000 mg by mouth every 8 (eight) hours as needed for mild pain (pain score 1-3).    [provider]  amoxicillin -clavulanate (AUGMENTIN ) 875-125 MG tablet Take 1 tablet by mouth every 12 (twelve) hours. 11/23/23   Harris, Abigail, PA-C  Blood Glucose Monitoring Suppl (ACCU-CHEK GUIDE) w/Device KIT Use as directed up to four times daily 04/19/22   Jinwala, Sagar H, MD  carvedilol  (COREG ) 6.25 MG tablet Take 1 tablet (6.25  mg total) by mouth 2 (two) times daily with a meal. 12/30/23   Lorren Greig PARAS, NP  folic acid  (FOLVITE ) 1 MG tablet Take 1 tablet (1 mg total) by mouth daily. 12/30/23   Lorren Greig PARAS, NP  furosemide  (LASIX ) 40 MG tablet Take 1 tablet (40 mg total) by mouth daily. 12/30/23   Lorren Greig PARAS, NP  gabapentin  (NEURONTIN ) 300 MG capsule Take 1 capsule (300 mg total) by mouth at bedtime. 11/07/23   Lorren Greig PARAS, NP  glucose blood (ACCU-CHEK GUIDE) test strip Use as directed up to four times daily. 11/29/22   Lorren Greig PARAS, NP  hydrALAZINE  (APRESOLINE ) 25 MG tablet Take 1 tablet (25 mg total) by mouth 2 (two) times daily. 12/30/23   Lorren Greig PARAS, NP  ibuprofen  (ADVIL ,MOTRIN ) 200 MG tablet Take 200-400 mg by mouth daily as needed (pain).    [provider]  Insulin  Glargine (BASAGLAR  KWIKPEN) 100 UNIT/ML Inject 34 Units into the skin daily. 01/27/24   Neysa Caron PARAS, DO  Insulin  Pen Needle (TRUEPLUS 5-BEVEL PEN NEEDLES) 32G X 4 MM MISC Use once daily 09/23/23   Lorren Greig PARAS, NP  Insulin  Pen Needle 32G X 4 MM MISC Use once daily 07/02/23   Lorren Greig PARAS, NP  melatonin 5 MG TABS Take 5 mg by mouth.    [provider]  naproxen  (NAPROSYN ) 500 MG tablet Take 1 tablet (500 mg total) by  mouth 2 (two) times daily. 03/26/23   Small, Brooke L, PA  oxyCODONE  (ROXICODONE ) 5 MG immediate release tablet Take 0.5-1 tablets (2.5-5 mg total) by mouth every 6 (six) hours as needed for severe pain (pain score 7-10). 11/23/23   Harris, Abigail, PA-C  spironolactone  (ALDACTONE ) 100 MG tablet Take 1 tablet (100 mg total) by mouth daily. 12/30/23   Lorren Greig PARAS, NP    Allergies: Patient has no known allergies.    Review of Systems  Updated Vital Signs BP 118/73   Pulse (!) 54   Temp 98 F (36.7 C) (Oral)   Resp 13   Ht 5' 4 (1.626 m)   Wt 72.6 kg   LMP 04/22/2011   SpO2 96%   BMI 27.46 kg/m   Physical Exam Vitals and nursing note reviewed.  Constitutional:      General: She is not  in acute distress.    Appearance: She is not toxic-appearing.  HENT:     Nose: Nose normal.     Mouth/Throat:     Mouth: Mucous membranes are moist.  Eyes:     Conjunctiva/sclera: Conjunctivae normal.  Cardiovascular:     Rate and Rhythm: Normal rate and regular rhythm.  Pulmonary:     Effort: Pulmonary effort is normal.  Abdominal:     General: Abdomen is flat. There is no distension.     Palpations: Abdomen is soft.     Tenderness: There is no abdominal tenderness. There is no guarding or rebound.  Musculoskeletal:        General: Normal range of motion.  Skin:    General: Skin is warm and dry.     Capillary Refill: Capillary refill takes less than 2 seconds.  Neurological:     General: No focal deficit present.     Mental Status: She is alert.     Cranial Nerves: No cranial nerve deficit.     Sensory: No sensory deficit.     Motor: No weakness.     Coordination: Coordination normal.  Psychiatric:        Mood and Affect: Mood normal.        Behavior: Behavior normal.     (all labs ordered are listed, but only abnormal results are displayed) Labs Reviewed  CBC - Abnormal; Notable for the following components:      Result Value   WBC 3.0 (*)    RBC 3.58 (*)    MCV 103.6 (*)    MCH 36.6 (*)    Platelets 56 (*)    All other components within normal limits  URINALYSIS, ROUTINE W REFLEX MICROSCOPIC - Abnormal; Notable for the following components:   Glucose, UA >=500 (*)    Hgb urine dipstick SMALL (*)    Bacteria, UA RARE (*)    All other components within normal limits  BASIC METABOLIC PANEL WITH GFR - Abnormal; Notable for the following components:   Sodium 130 (*)    Chloride 97 (*)    Glucose, Bld 469 (*)    Calcium 8.6 (*)    All other components within normal limits  HEPATIC FUNCTION PANEL - Abnormal; Notable for the following components:   Albumin  2.7 (*)    AST 94 (*)    ALT 75 (*)    Total Bilirubin 3.5 (*)    Bilirubin, Direct 1.5 (*)    Indirect  Bilirubin 2.0 (*)    All other components within normal limits  CBG MONITORING, ED - Abnormal; Notable for the  following components:   Glucose-Capillary 452 (*)    All other components within normal limits  I-STAT VENOUS BLOOD GAS, ED - Abnormal; Notable for the following components:   pH, Ven 7.439 (*)    pCO2, Ven 42.8 (*)    pO2, Ven 25 (*)    Bicarbonate 29.0 (*)    Acid-Base Excess 4.0 (*)    Sodium 133 (*)    All other components within normal limits  I-STAT CHEM 8, ED - Abnormal; Notable for the following components:   Sodium 132 (*)    Chloride 96 (*)    Glucose, Bld 473 (*)    Calcium, Ion 1.05 (*)    All other components within normal limits  CBG MONITORING, ED - Abnormal; Notable for the following components:   Glucose-Capillary 453 (*)    All other components within normal limits  CBG MONITORING, ED - Abnormal; Notable for the following components:   Glucose-Capillary 455 (*)    All other components within normal limits  CBG MONITORING, ED - Abnormal; Notable for the following components:   Glucose-Capillary 449 (*)    All other components within normal limits  CBG MONITORING, ED - Abnormal; Notable for the following components:   Glucose-Capillary 378 (*)    All other components within normal limits    EKG: None  Radiology: CT VENOGRAM HEAD Result Date: 01/27/2024 CLINICAL DATA:  58 year old female with recent headache. EXAM: CT VENOGRAM HEAD TECHNIQUE: Venographic phase images of the brain were obtained following the administration of intravenous contrast. Multiplanar reformats and maximum intensity projections were generated. RADIATION DOSE REDUCTION: This exam was performed according to the departmental dose-optimization program which includes automated exposure control, adjustment of the mA and/or kV according to patient size and/or use of iterative reconstruction technique. CONTRAST:  75mL OMNIPAQUE  IOHEXOL  350 MG/ML SOLN COMPARISON:  Head CT without contrast  01/24/2024. FINDINGS: CT HEAD Brain: Normal cerebral volume. No midline shift, ventriculomegaly, mass effect, evidence of mass lesion, intracranial hemorrhage or evidence of cortically based acute infarction. Gray-white matter differentiation is within normal limits throughout the brain. No abnormal enhancement identified. Calvarium and skull base: Intact, negative. Paranasal sinuses: Ongoing right sphenoid sinus mucosal thickening and opacification, that sinus now subtotally opacified. But other Visualized paranasal sinuses and mastoids remain well aerated. Orbits: Visualized orbits and scalp soft tissues are within normal limits. CTV HEAD Venous sinuses: Patent and enhancing superior sagittal sinus, torcula, straight sinus, vein of Galen, internal cerebral veins, bilateral transverse, bilateral sigmoid venous sinuses. Bilateral IJ bulbs appear patent and enhancing. Bilateral cavernous sinus also appears to be enhancing, normal. And major draining cortical veins at the vertex appear to be enhancing and patent. Anatomic variants: None. Other findings: Major intracranial arterial structures also appear to be enhancing as expected. Calcified atherosclerosis at the skull base. Review of the MIP images confirms the above findings IMPRESSION: 1. Negative intracranial CT Venogram, no evidence of dural venous sinus thrombosis. 2. Normal for age CT appearance of the brain. 3. Ongoing sphenoid sinus inflammation, primarily on the right. Electronically Signed   By: VEAR Hurst M.D.   On: 01/27/2024 12:56   DG Chest Portable 1 View Result Date: 01/27/2024 CLINICAL DATA:  Decreased appetite. Hyperglycemia. Concern for pneumonia. EXAM: PORTABLE CHEST 1 VIEW COMPARISON:  04/18/2022. FINDINGS: The heart size and mediastinal contours are within normal limits. No focal consolidation, pleural effusion, or pneumothorax. No acute osseous abnormality. IMPRESSION: No acute cardiopulmonary findings. Electronically Signed   By: Harrietta Marcelino HERO.D.  On: 01/27/2024 10:58     Procedures   Medications Ordered in the ED  lactated ringers  bolus 1,000 mL (0 mLs Intravenous Stopped 01/27/24 1323)  insulin  aspart (novoLOG ) injection 10 Units (10 Units Subcutaneous Given 01/27/24 1218)  acetaminophen  (TYLENOL ) tablet 1,000 mg (1,000 mg Oral Given 01/27/24 1157)  metoCLOPramide  (REGLAN ) injection 10 mg (10 mg Intravenous Given 01/27/24 1157)  diphenhydrAMINE  (BENADRYL ) capsule 25 mg (25 mg Oral Given 01/27/24 1158)  iohexol  (OMNIPAQUE ) 350 MG/ML injection 75 mL (75 mLs Intravenous Contrast Given 01/27/24 1218)    Clinical Course as of 01/27/24 1538  Mon Jan 27, 2024  1211 Ketones, ur: NEGATIVE [TY]  1211 pH, Ven(!): 7.439 Not dka [TY]  1211 Anion gap: 7 [TY]  1211 Sodium(!): 132 Psuedohyponatrmia given BS levels  [TY]  1336 CT VENOGRAM HEAD IMPRESSION: 1. Negative intracranial CT Venogram, no evidence of dural venous sinus thrombosis. 2. Normal for age CT appearance of the brain. 3. Ongoing sphenoid sinus inflammation, primarily on the right.   Electronically Signed   By: VEAR Hurst M.D.   On: 01/27/2024 12:56   [TY]    Clinical Course User Index [TY] Neysa Caron PARAS, DO                                 Medical Decision Making This a well-appearing 58 year old female presenting emergency department for hyperglycemia.  She is afebrile nontachycardic, normotensive.  Maintaining oxygen saturation on room air.  Does not appear to be in overt distress.  She does have a headache that has been ongoing for several days.  Per chart review seen in the emergency department few days ago with negative CT head.  She does have hyperglycemia, but son appeared to be in DKA.  Given persistent headache and location concern for possible dural venous thrombosis.  CT venogram however negative.  Chest x-ray without overt infectious source.  UA negative as well.  Was given IV fluids, insulin  with improvement of her blood sugar.  Also given  migraine cocktail as well.  Symptoms greatly improved with improvement of her blood sugar.  Diabetes educator/coordinator contacted.  Agree with assessment that patient likely would benefit from insulin  adjustments.  Increased long-acting and added sliding scale.  Patient encouraged to follow-up with primary doctor soon as possible.  Return to the emergency department.  Stable for discharge.  Amount and/or Complexity of Data Reviewed Labs: ordered. Decision-making details documented in ED Course.    Details: Of note patient with similar liver enzymes as a few days ago.  Continues to have no abdominal pain nausea vomiting.  Does have a history of cirrhosis and follows with GI and PCP.  Encouraged her to follow-up and return if develops abdominal pain, nausea vomiting or GI symptoms. Radiology: ordered. Decision-making details documented in ED Course.  Risk OTC drugs. Prescription drug management. Decision regarding hospitalization. Diagnosis or treatment significantly limited by social determinants of health. Risk Details: No insurance.        Final diagnoses:  Hyperglycemia    ED Discharge Orders          Ordered    Insulin  Glargine (BASAGLAR  KWIKPEN) 100 UNIT/ML  Daily        01/27/24 1522    insulin  lispro (HUMALOG  KWIKPEN) 100 UNIT/ML KwikPen  3 times daily with meals        01/27/24 1525    Insulin  Pen Needle 32G X 4 MM MISC  3 times  daily with meals        01/27/24 1525               Neysa Caron PARAS, [place] 01/27/24 1538

## 2024-01-27 NOTE — Telephone Encounter (Signed)
 NOTED

## 2024-01-27 NOTE — Telephone Encounter (Signed)
 FYI Only or Action Required?:  Update  Patient was last seen in primary care on 12/30/2023 by Lorren Greig PARAS, NP.  Called Nurse Triage reporting Hyperglycemia.  Symptoms began several days ago.  Interventions attempted: Prescription medications: insulin  pen.  Symptoms are: unchanged.  Triage Disposition: Go to ED Now (Notify PCP)  Patient/caregiver understands and will follow disposition?: YesCopied from CRM [id number]. Topic: Clinical - Red Word Triage >> Jan 27, 2024  8:36 AM Delon HERO wrote: Red Word that prompted transfer to Nurse Triage: Patient is calling to report that she is having a headache, nausea, Blood Sugar is over 400, with a little dizziness, no or little appeptite Reporting to the ED on 01/24/24 presenting with headache and hyperglycemia.  Patient's blood glucose was 500 on arrival.  Patient also has been in the hot building as a security guard.  Consider heat exhaustion versus dehydration from hyperglycemia.  Low suspicion for DKA.  Also consider intracranial mass as well.  Will get CT head and labs.  Will hydrate patient and reassess. Reason for Disposition  [1] Blood glucose > 240 mg/dL (86.6 mmol/L) AND [7] rapid breathing  Answer Assessment - Initial Assessment Questions 1. BLOOD GLUCOSE: What is your blood glucose level?      410 2. ONSET: When did you check the blood glucose?     6am 3. USUAL RANGE: What is your glucose level usually? (e.g., usual fasting morning value, usual evening value)     Low 100s 4. KETONES: Do you check for ketones (urine or blood test strips)? If Yes, ask: What does the test show now?      Na  5. TYPE 1 or 2:  Do you know what type of diabetes you have?  (e.g., Type 1, Type 2, Gestational; doesn't know)      Not sure 6. INSULIN : Do you take insulin ? What type of insulin (s) do you use? What is the mode of delivery? (syringe, pen; injection or pump)?      Insulin  pin  7. DIABETES PILLS: Do you take any pills for your  diabetes? If Yes, ask: Have you missed taking any pills recently?     denies 8. OTHER SYMPTOMS: Do you have any symptoms? (e.g., fever, frequent urination, difficulty breathing, dizziness, weakness, vomiting)     Headache, dizzy, vision is blurry, no appetite    Pt has taken insulin  as prescribed and is still experiencing elevated BS levels. Pt was seen in ED on 7/25. RN advised pt to go back to ED now. Pt stated she will.  Protocols used: Diabetes - High Blood Sugar-A-AH

## 2024-02-12 ENCOUNTER — Ambulatory Visit (INDEPENDENT_AMBULATORY_CARE_PROVIDER_SITE_OTHER): Payer: Self-pay | Admitting: Family

## 2024-02-12 ENCOUNTER — Ambulatory Visit: Payer: Self-pay | Admitting: Family

## 2024-02-12 ENCOUNTER — Encounter: Payer: Self-pay | Admitting: Family

## 2024-02-12 VITALS — BP 110/73 | HR 60 | Temp 97.9°F | Resp 16 | Ht 64.0 in | Wt 174.8 lb

## 2024-02-12 DIAGNOSIS — E1165 Type 2 diabetes mellitus with hyperglycemia: Secondary | ICD-10-CM

## 2024-02-12 DIAGNOSIS — Z09 Encounter for follow-up examination after completed treatment for conditions other than malignant neoplasm: Secondary | ICD-10-CM

## 2024-02-12 DIAGNOSIS — R519 Headache, unspecified: Secondary | ICD-10-CM

## 2024-02-12 DIAGNOSIS — R739 Hyperglycemia, unspecified: Secondary | ICD-10-CM

## 2024-02-12 DIAGNOSIS — T675XXD Heat exhaustion, unspecified, subsequent encounter: Secondary | ICD-10-CM

## 2024-02-12 DIAGNOSIS — E119 Type 2 diabetes mellitus without complications: Secondary | ICD-10-CM

## 2024-02-12 DIAGNOSIS — Z794 Long term (current) use of insulin: Secondary | ICD-10-CM

## 2024-02-12 DIAGNOSIS — T675XXA Heat exhaustion, unspecified, initial encounter: Secondary | ICD-10-CM

## 2024-02-12 LAB — POCT GLYCOSYLATED HEMOGLOBIN (HGB A1C): Hemoglobin A1C: 12 % — AB (ref 4.0–5.6)

## 2024-02-12 NOTE — Progress Notes (Signed)
 Patient ID: Mary Anderson, female    DOB: [birth year]  MRN: [id number]  CC: Emergency Department Follow-Up  Subjective: Mary Anderson is a 58 y.o. female who presents for Emergency Department follow-up.   Her concerns today include:  Patient seen on 01/24/2024 - 01/25/2024 (8 hours) at [facility] Emergency Department at [facility] for hyperglycemia, heat exhaustion, and non-intractable headache. Patient was seen again on 01/27/2024 (5 hours) at [facility] Emergency Department at [facility] for hyperglycemia and type 2 diabetes with long-term current use of insulin . Today patient states she is feeling improved and denies red flag symptoms. She is doing well on medication regimen. She is watching what she eats. She exercises. She denies red flag symptoms associated with diabetes. She is due for diabetic eye exam.   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     amoxicillin -clavulanate (AUGMENTIN ) 875-125 MG tablet Take 1 tablet by mouth every 12 (twelve) hours. 14 tablet 0   Blood  Glucose Monitoring Suppl (ACCU-CHEK GUIDE) w/Device KIT Use as directed up to four times daily 1 kit 0   carvedilol  (COREG ) 6.25 MG tablet Take 1 tablet (6.25 mg total) by mouth 2 (two) times daily with a meal. 180 tablet 0   folic acid  (FOLVITE ) 1 MG tablet Take 1 tablet (1 mg total) by mouth daily. 90 tablet 0   furosemide  (LASIX ) 40 MG tablet Take 1 tablet (40 mg total) by mouth daily. 90 tablet 0   gabapentin  (NEURONTIN ) 300 MG capsule Take 1 capsule (300 mg total) by mouth at bedtime. 90 capsule 0   glucose blood (ACCU-CHEK GUIDE) test strip Use as directed up to four times daily. 100 each 0   hydrALAZINE  (APRESOLINE ) 25 MG tablet Take 1 tablet (25 mg total) by mouth 2 (two) times daily. 180 tablet 0   ibuprofen  (ADVIL ,MOTRIN ) 200 MG tablet Take 200-400 mg by mouth daily as needed (pain).     Insulin  Glargine (BASAGLAR  KWIKPEN) 100 UNIT/ML Inject 34 Units into the skin daily. 15 mL 1   insulin  lispro (HUMALOG  KWIKPEN) 100 UNIT/ML KwikPen Inject 0-9 Units into the skin with breakfast, with lunch, and with evening meal. CBG 120 mg/dl or less 0 units of insulin  CBG 121-150 1 unit CBG 151-200 2 units CBG 201-250 3 units CBG 251-300 5 units CBG 301-350 7 units CBG 351 or higher 9 units 15 mL 11   Insulin  Pen Needle (TRUEPLUS 5-BEVEL PEN NEEDLES) 32G X 4 MM MISC Use once daily 100 each 0   Insulin  Pen Needle 32G X 4 MM MISC Use once daily 100 each 0  Insulin Pen Needle 32G X 4 MM MISC Use to inject insulin with breakfast, with lunch, and with evening meal. 100 each 0   melatonin 5 MG TABS Take 5 mg by mouth.     naproxen (NAPROSYN) 500 MG tablet Take 1 tablet (500 mg total) by mouth 2 (two) times daily. 14 tablet 0   oxyCODONE (ROXICODONE) 5 MG immediate release tablet Take 0.5-1 tablets (2.5-5 mg total) by mouth every 6 (six) hours as needed for severe pain (pain score 7-10). 8 tablet 0   spironolactone (ALDACTONE) 100 MG tablet Take 1 tablet (100 mg total) by mouth daily. 90 tablet 0   No  current facility-administered medications on file prior to visit.    No Known Allergies  Social History   Socioeconomic History   Marital status: Married    Spouse name: Not on file   Number of children: Not on file   Years of education: Not on file   Highest education level: 11th grade  Occupational History   Not on file  Tobacco Use   Smoking status: Never    Passive exposure: Never   Smokeless tobacco: Never  Vaping Use   Vaping status: Never Used  Substance and Sexual Activity   Alcohol use: Not Currently    Comment: no alcohol the past 6 months.   Drug use: No   Sexual activity: Yes    Birth control/protection: None  Other Topics Concern   Not on file  Social History Narrative   Not on file   Social Drivers of Health   Financial Resource Strain: Low Risk  (12/29/2023)   Overall Financial Resource Strain (CARDIA)    Difficulty of Paying Living Expenses: Not hard at all  Food Insecurity: No Food Insecurity (12/29/2023)   Hunger Vital Sign    Worried About Running Out of Food in the Last Year: Never true    Ran Out of Food in the Last Year: Never true  Transportation Needs: No Transportation Needs (12/29/2023)   PRAPARE - Administrator, Civil Service (Medical): No    Lack of Transportation (Non-Medical): No  Physical Activity: Insufficiently Active (12/29/2023)   Exercise Vital Sign    Days of Exercise per Week: 1 day    Minutes of Exercise per Session: 10 min  Stress: No Stress Concern Present (12/29/2023)   [employer] of Occupational Health - Occupational Stress Questionnaire    Feeling of Stress: Not at all  Social Connections: Moderately Isolated (12/29/2023)   Social Connection and Isolation Panel    Frequency of Communication with Friends and Family: More than three times a week    Frequency of Social Gatherings with Friends and Family: Once a week    Attends Religious Services: Never    Database administrator or Organizations: No     Attends Engineer, structural: Not on file    Marital Status: Married  Catering manager Violence: Not At Risk (04/19/2022)   Humiliation, Afraid, Rape, and Kick questionnaire    Fear of Current or Ex-Partner: No    Emotionally Abused: No    Physically Abused: No    Sexually Abused: No    Family History  Problem Relation Age of Onset   Hypertension Mother    Colon cancer Mother    Hypertension Brother    Cancer Maternal Uncle    Cancer Maternal Uncle    Clotting disorder Neg Hx    Diabetes Neg Hx     Past Surgical History:  Procedure Laterality Date   ESOPHAGOGASTRODUODENOSCOPY (EGD) WITH PROPOFOL  N/A 01/30/2022   Procedure: ESOPHAGOGASTRODUODENOSCOPY (EGD) WITH PROPOFOL ;  Surgeon: Jinny Carmine, MD;  Location: [facility] ENDOSCOPY;  Service: Endoscopy;  Laterality: N/A;   IR THORACENTESIS ASP PLEURAL SPACE W/IMG GUIDE  10/27/2021    ROS: Review of Systems Negative except as stated above  PHYSICAL EXAM: BP 110/73   Pulse 60   Temp 97.9 F (36.6 C) (Oral)   Resp 16   Ht 5' 4 (1.626 m)   Wt 174 lb 12.8 oz (79.3 kg)   LMP 04/22/2011   SpO2 98%   BMI 30.00 kg/m   Physical Exam HENT:     Head: Normocephalic and atraumatic.     Nose: Nose normal.     Mouth/Throat:     Mouth: Mucous membranes are moist.     Pharynx: Oropharynx is clear.  Eyes:     Extraocular Movements: Extraocular movements intact.     Conjunctiva/sclera: Conjunctivae normal.     Pupils: Pupils are equal, round, and reactive to light.  Cardiovascular:     Rate and Rhythm: Normal rate and regular rhythm.     Pulses: Normal pulses.     Heart sounds: Normal heart sounds.  Pulmonary:     Effort: Pulmonary effort is normal.     Breath sounds: Normal breath sounds.  Musculoskeletal:        General: Normal range of motion.     Cervical back: Normal range of motion and neck supple.  Neurological:     General: No focal deficit present.     Mental Status: She is alert and oriented to person, place,  and time.  Psychiatric:        Mood and Affect: Mood normal.        Behavior: Behavior normal.     ASSESSMENT AND PLAN: 1. Hyperglycemia (Primary) 2. Type 2 diabetes mellitus with hyperglycemia, with long-term current use of insulin  (HCC) - Continue present management. No refills needed as of present. - Hemoglobin A1c result pending.  - Discussed the importance of healthy eating habits, low-carbohydrate diet, low-sugar diet, regular aerobic exercise (at least 150 minutes a week as tolerated) and medication compliance to achieve or maintain control of diabetes. Counseled on medication adherence/adverse effects.  - Referral to Endocrinology for evaluation/management.  - Follow-up with primary provider as scheduled.  - POCT glycosylated hemoglobin (Hb A1C) - Ambulatory referral to Endocrinology  3. Diabetic eye exam [facility]) - Referral to Ophthalmology for evaluation/management. - Ambulatory referral to Ophthalmology  4. Heat exhaustion, subsequent encounter 5. Nonintractable headache, unspecified chronicity pattern, unspecified headache type - Resolved.    Patient was given the opportunity to ask questions.  Patient verbalized understanding of the plan and was able to repeat key elements of the plan. Patient was given clear instructions to go to Emergency Department or return to medical center if symptoms don't improve, worsen, or new problems develop.The patient verbalized understanding.   Orders Placed This Encounter  Procedures   Ambulatory referral to Ophthalmology   Ambulatory referral to Endocrinology   POCT glycosylated hemoglobin (Hb A1C)    Follow-up with primary provider as scheduled.  Greig JINNY Drones, NP

## 2024-02-12 NOTE — Progress Notes (Signed)
 Follow up, patient was in the emergency room two weeks ago and Saturday

## 2024-02-17 ENCOUNTER — Other Ambulatory Visit: Payer: Self-pay | Admitting: Family

## 2024-02-17 DIAGNOSIS — E1165 Type 2 diabetes mellitus with hyperglycemia: Secondary | ICD-10-CM

## 2024-02-17 NOTE — Telephone Encounter (Unsigned)
 Copied from CRM #[id number]. Topic: Clinical - Medication Refill >> Feb 17, 2024  1:23 PM Tiffini S wrote: Medication: Insulin  Glargine (BASAGLAR  KWIKPEN) 100 UNIT/ML  Has the patient contacted their pharmacy? No (Agent: If no, request that the patient contact the pharmacy for the refill. If patient does not wish to contact the pharmacy document the reason why and proceed with request.) (Agent: If yes, when and what did the pharmacy advise?)  This is the patient's preferred pharmacy:  [facility] - [facility] [address] [place] [address] Phone: [phone] Fax: [phone]   Is this the correct pharmacy for this prescription? Yes If no, delete pharmacy and type the correct one.   Has the prescription been filled recently? Yes  Is the patient out of the medication? Yes, patient have one pen left   Has the patient been seen for an appointment in the last year OR does the patient have an upcoming appointment? Yes  Can we respond through MyChart? No, please call the patient at [phone]  Agent: Please be advised that Rx refills may take up to 3 business days. We ask that you follow-up with your pharmacy.

## 2024-02-19 ENCOUNTER — Other Ambulatory Visit: Payer: Self-pay

## 2024-02-19 MED ORDER — BASAGLAR KWIKPEN 100 UNIT/ML ~~LOC~~ SOPN
34.0000 [IU] | PEN_INJECTOR | Freq: Every day | SUBCUTANEOUS | 1 refills | Status: DC
  Filled 2024-02-19: qty 9, 26d supply, fill #0

## 2024-02-19 NOTE — Telephone Encounter (Signed)
 Requested medication (s) are due for refill today: no  Requested medication (s) are on the active medication list: yes  Last refill:  01/27/24 15 ml 1 RF (88 days worth)  Future visit scheduled: yes  Notes to clinic:  was prescribed by another provider at hospital discharge by Caron Salt DO   Requested Prescriptions  Pending Prescriptions Disp Refills   Insulin  Glargine (BASAGLAR  KWIKPEN) 100 UNIT/ML 15 mL 1    Sig: Inject 34 Units into the skin daily.     Endocrinology:  Diabetes - Insulins Failed - 02/19/2024 12:22 PM      Failed - HBA1C is between 0 and 7.9 and within 180 days    Hemoglobin A1C  Date Value Ref Range Status  02/12/2024 12.0 (A) 4.0 - 5.6 % Final   HbA1c, POC (prediabetic range)  Date Value Ref Range Status  08/29/2022 6.2 5.7 - 6.4 % Final   HbA1c, POC (controlled diabetic range)  Date Value Ref Range Status  09/30/2023 7.1 (A) 0.0 - 7.0 % Final         Passed - Valid encounter within last 6 months    Recent Outpatient Visits           1 week ago Hyperglycemia   [facility], Amy J, NP   1 month ago Primary hypertension   [facility], Amy J, NP   4 months ago Primary hypertension   [facility], Amy J, NP   7 months ago Primary hypertension   [facility], Amy J, NP   8 months ago Diabetic eye exam [facility])   [facility], Greig PARAS, NP

## 2024-03-25 ENCOUNTER — Other Ambulatory Visit (HOSPITAL_COMMUNITY): Payer: Self-pay

## 2024-04-01 ENCOUNTER — Encounter: Payer: Self-pay | Admitting: Family

## 2024-04-01 ENCOUNTER — Other Ambulatory Visit: Payer: Self-pay

## 2024-04-01 ENCOUNTER — Ambulatory Visit (INDEPENDENT_AMBULATORY_CARE_PROVIDER_SITE_OTHER): Payer: Self-pay | Admitting: Family

## 2024-04-01 VITALS — BP 114/75 | HR 63 | Temp 98.3°F | Resp 16 | Ht 64.0 in | Wt 167.2 lb

## 2024-04-01 DIAGNOSIS — E119 Type 2 diabetes mellitus without complications: Secondary | ICD-10-CM

## 2024-04-01 DIAGNOSIS — Z79899 Other long term (current) drug therapy: Secondary | ICD-10-CM

## 2024-04-01 DIAGNOSIS — E1165 Type 2 diabetes mellitus with hyperglycemia: Secondary | ICD-10-CM

## 2024-04-01 DIAGNOSIS — I10 Essential (primary) hypertension: Secondary | ICD-10-CM

## 2024-04-01 DIAGNOSIS — Z794 Long term (current) use of insulin: Secondary | ICD-10-CM

## 2024-04-01 MED ORDER — BASAGLAR KWIKPEN 100 UNIT/ML ~~LOC~~ SOPN
34.0000 [IU] | PEN_INJECTOR | Freq: Every day | SUBCUTANEOUS | 1 refills | Status: DC
  Filled 2024-04-01: qty 9, 26d supply, fill #0
  Filled 2024-04-29: qty 9, 26d supply, fill #1

## 2024-04-01 MED ORDER — HYDRALAZINE HCL 25 MG PO TABS
25.0000 mg | ORAL_TABLET | Freq: Two times a day (BID) | ORAL | 0 refills | Status: DC
  Filled 2024-04-01: qty 180, 90d supply, fill #0

## 2024-04-01 MED ORDER — CARVEDILOL 6.25 MG PO TABS
6.2500 mg | ORAL_TABLET | Freq: Two times a day (BID) | ORAL | 0 refills | Status: DC
  Filled 2024-04-01: qty 180, 90d supply, fill #0

## 2024-04-01 MED ORDER — FUROSEMIDE 40 MG PO TABS
40.0000 mg | ORAL_TABLET | Freq: Every day | ORAL | 0 refills | Status: DC
  Filled 2024-04-01: qty 90, 90d supply, fill #0

## 2024-04-01 MED ORDER — SPIRONOLACTONE 100 MG PO TABS
100.0000 mg | ORAL_TABLET | Freq: Every day | ORAL | 0 refills | Status: DC
  Filled 2024-04-01: qty 90, 90d supply, fill #0

## 2024-04-01 NOTE — Progress Notes (Signed)
 3 month follow-up

## 2024-04-01 NOTE — Progress Notes (Addendum)
 Patient ID: RAMIAH HELFRICH, female    DOB: 10-Dec-1965  MRN: [id number]  CC: Chronic Conditions Follow-Up  Subjective: Mary Anderson is a 58 y.o. female who presents for chronic conditions follow-up.   Her concerns today include:  - Doing well on Hydralazine , Spironolactone , Carvedilol , and Furosemide , no issues/concerns. She does not complain of red flag symptoms such as but not limited to chest pain, shortness of breath, worst headache of life, nausea/vomiting.  - Doing well on Insulin  Glargine, no issues/concerns. Denies red flag symptoms associated with diabetes. States since previous office visit she did not establish with Endocrinology.  - Due for diabetic eye exam.  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     amoxicillin -clavulanate (AUGMENTIN ) 875-125 MG tablet Take 1 tablet by mouth every 12 (twelve) hours. 14 tablet 0   Blood Glucose Monitoring Suppl (ACCU-CHEK GUIDE) w/Device KIT Use as directed up to four times daily 1 kit 0   folic acid  (FOLVITE ) 1 MG tablet Take 1 tablet (1 mg total) by  mouth daily. 90 tablet 0   gabapentin  (NEURONTIN ) 300 MG capsule Take 1 capsule (300 mg total) by mouth at bedtime. 90 capsule 0   glucose blood (ACCU-CHEK GUIDE) test strip Use as directed up to four times daily. 100 each 0   ibuprofen  (ADVIL ,MOTRIN ) 200 MG tablet Take 200-400 mg by mouth daily as needed (pain).     insulin  lispro (HUMALOG  KWIKPEN) 100 UNIT/ML KwikPen Inject 0-9 Units into the skin with breakfast, with lunch, and with evening meal. CBG 120 mg/dl or less 0 units of insulin  CBG 121-150 1 unit CBG 151-200 2 units CBG 201-250 3 units CBG 251-300 5 units CBG 301-350 7 units CBG 351 or higher 9 units 15 mL 11   Insulin  Pen Needle (TRUEPLUS 5-BEVEL PEN NEEDLES) 32G X 4 MM MISC Use once daily 100 each 0   Insulin  Pen Needle 32G X 4 MM MISC Use once daily 100 each 0   Insulin  Pen Needle 32G X 4 MM MISC Use to inject insulin  with breakfast, with lunch, and with evening meal. 100 each 0   melatonin 5 MG TABS Take 5 mg by mouth.     naproxen  (NAPROSYN ) 500 MG tablet Take 1 tablet (500 mg total) by mouth 2 (two) times daily. 14 tablet 0   oxyCODONE  (ROXICODONE ) 5 MG immediate release tablet Take 0.5-1 tablets (2.5-5 mg total) by mouth every 6 (six) hours as needed for severe pain (pain score 7-10). 8 tablet 0   No current facility-administered medications on file prior to visit.    No Known Allergies  Social History  Socioeconomic History   Marital status: Married    Spouse name: Not on file   Number of children: Not on file   Years of education: Not on file   Highest education level: 11th grade  Occupational History   Not on file  Tobacco Use   Smoking status: Never    Passive exposure: Never   Smokeless tobacco: Never  Vaping Use   Vaping status: Never Used  Substance and Sexual Activity   Alcohol use: Not Currently    Comment: no alcohol the past 6 months.   Drug use: No   Sexual activity: Yes    Birth control/protection: None  Other Topics Concern   Not on file   Social History Narrative   Not on file   Social Drivers of Health   Financial Resource Strain: Low Risk  (12/29/2023)   Overall Financial Resource Strain (CARDIA)    Difficulty of Paying Living Expenses: Not hard at all  Food Insecurity: No Food Insecurity (12/29/2023)   Hunger Vital Sign    Worried About Running Out of Food in the Last Year: Never true    Ran Out of Food in the Last Year: Never true  Transportation Needs: No Transportation Needs (12/29/2023)   PRAPARE - Administrator, Civil Service (Medical): No    Lack of Transportation (Non-Medical): No  Physical Activity: Insufficiently Active (12/29/2023)   Exercise Vital Sign    Days of Exercise per Week: 1 day    Minutes of Exercise per Session: 10 min  Stress: No Stress Concern Present (12/29/2023)   [employer] of Occupational Health - Occupational Stress Questionnaire    Feeling of Stress: Not at all  Social Connections: Moderately Isolated (12/29/2023)   Social Connection and Isolation Panel    Frequency of Communication with Friends and Family: More than three times a week    Frequency of Social Gatherings with Friends and Family: Once a week    Attends Religious Services: Never    Database administrator or Organizations: No    Attends Engineer, structural: Not on file    Marital Status: Married  Catering manager Violence: Not At Risk (04/19/2022)   Humiliation, Afraid, Rape, and Kick questionnaire    Fear of Current or Ex-Partner: No    Emotionally Abused: No    Physically Abused: No    Sexually Abused: No    Family History  Problem Relation Age of Onset   Hypertension Mother    Colon cancer Mother    Hypertension Brother    Cancer Maternal Uncle    Cancer Maternal Uncle    Clotting disorder Neg Hx    Diabetes Neg Hx     Past Surgical History:  Procedure Laterality Date   ESOPHAGOGASTRODUODENOSCOPY (EGD) WITH PROPOFOL  N/A 01/30/2022   Procedure: ESOPHAGOGASTRODUODENOSCOPY (EGD)  WITH PROPOFOL ;  Surgeon: Jinny Carmine, MD;  Location: [facility] ENDOSCOPY;  Service: Endoscopy;  Laterality: N/A;   IR THORACENTESIS ASP PLEURAL SPACE W/IMG GUIDE  10/27/2021    ROS: Review of Systems Negative except as stated above  PHYSICAL EXAM: BP 114/75   Pulse 63   Temp 98.3 F (36.8 C) (Oral)   Resp 16   Ht 5' 4 (1.626 m)   Wt 167 lb 3.2 oz (75.8 kg)   LMP 04/22/2011   SpO2 95%   BMI 28.70 kg/m   Physical Exam HENT:     Head: Normocephalic and atraumatic.     Nose: Nose normal.  Mouth/Throat:     Mouth: Mucous membranes are moist.     Pharynx: Oropharynx is clear.  Eyes:     Extraocular Movements: Extraocular movements intact.     Conjunctiva/sclera: Conjunctivae normal.     Pupils: Pupils are equal, round, and reactive to light.  Cardiovascular:     Rate and Rhythm: Normal rate and regular rhythm.     Pulses: Normal pulses.     Heart sounds: Normal heart sounds.  Pulmonary:     Effort: Pulmonary effort is normal.     Breath sounds: Normal breath sounds.  Musculoskeletal:        General: Normal range of motion.     Cervical back: Normal range of motion and neck supple.  Neurological:     General: No focal deficit present.     Mental Status: She is alert and oriented to person, place, and time.  Psychiatric:        Mood and Affect: Mood normal.        Behavior: Behavior normal.     ASSESSMENT AND PLAN: 1. Primary hypertension (Primary) - Continue Carvedilol , Hydralazine , Spironolactone , and Furosemide  as prescribed.  - Routine screening.  - Counseled on blood pressure goal of less than 130/80, low-sodium, DASH diet, medication compliance, and 150 minutes of moderate intensity exercise per week as tolerated. Counseled on medication adherence and adverse effects. - Follow-up with primary provider in 3 months or sooner if needed. - Basic Metabolic Panel - carvedilol  (COREG ) 6.25 MG tablet; Take 1 tablet (6.25 mg total) by mouth 2 (two) times daily with a  meal.  Dispense: 180 tablet; Refill: 0 - hydrALAZINE  (APRESOLINE ) 25 MG tablet; Take 1 tablet (25 mg total) by mouth 2 (two) times daily.  Dispense: 180 tablet; Refill: 0 - spironolactone  (ALDACTONE ) 100 MG tablet; Take 1 tablet (100 mg total) by mouth daily.  Dispense: 90 tablet; Refill: 0 - furosemide  (LASIX ) 40 MG tablet; Take 1 tablet (40 mg total) by mouth daily.  Dispense: 90 tablet; Refill: 0  2. Type 2 diabetes mellitus with hyperglycemia, with long-term current use of insulin  (HCC) - Hemoglobin A1c result pending.  - Continue Insulin  Glargine as prescribed.  - Discussed the importance of healthy eating habits, low-carbohydrate diet, low-sugar diet, regular aerobic exercise (at least 150 minutes a week as tolerated) and medication compliance to achieve or maintain control of diabetes. Counseled on medication adherence/adverse effects.  - Referral to Endocrinology for evaluation/management. - Follow-up with primary provider as scheduled. - Hemoglobin A1c - Ambulatory referral to Endocrinology - Insulin  Glargine (BASAGLAR  KWIKPEN) 100 UNIT/ML; Inject 34 Units into the skin daily.  Dispense: 15 mL; Refill: 1  3. Diabetic eye exam [facility]) - Referral to Ophthalmology for evaluation/management. - Ambulatory referral to Ophthalmology  Patient was given the opportunity to ask questions.  Patient verbalized understanding of the plan and was able to repeat key elements of the plan. Patient was given clear instructions to go to Emergency Department or return to medical center if symptoms don't improve, worsen, or new problems develop.The patient verbalized understanding.   Orders Placed This Encounter  Procedures   Basic Metabolic Panel   Hemoglobin A1c   Ambulatory referral to Ophthalmology   Ambulatory referral to Endocrinology     Requested Prescriptions   Signed Prescriptions Disp Refills   Insulin  Glargine (BASAGLAR  KWIKPEN) 100 UNIT/ML 15 mL 1    Sig: Inject 34 Units into the skin  daily.   carvedilol  (COREG ) 6.25 MG tablet 180 tablet 0  Sig: Take 1 tablet (6.25 mg total) by mouth 2 (two) times daily with a meal.   hydrALAZINE  (APRESOLINE ) 25 MG tablet 180 tablet 0    Sig: Take 1 tablet (25 mg total) by mouth 2 (two) times daily.   spironolactone  (ALDACTONE ) 100 MG tablet 90 tablet 0    Sig: Take 1 tablet (100 mg total) by mouth daily.   furosemide  (LASIX ) 40 MG tablet 90 tablet 0    Sig: Take 1 tablet (40 mg total) by mouth daily.    Return in about 3 months (around 07/02/2024) for Follow-Up or next available chronic conditions.  Greig JINNY Drones, NP

## 2024-04-02 LAB — HEMOGLOBIN A1C
Est. average glucose Bld gHb Est-mCnc: 329 mg/dL
Hgb A1c MFr Bld: 13.1 % — ABNORMAL HIGH (ref 4.8–5.6)

## 2024-04-02 LAB — BASIC METABOLIC PANEL WITH GFR
BUN/Creatinine Ratio: 15 (ref 9–23)
BUN: 17 mg/dL (ref 6–24)
CO2: 25 mmol/L (ref 20–29)
Calcium: 9.2 mg/dL (ref 8.7–10.2)
Chloride: 94 mmol/L — ABNORMAL LOW (ref 96–106)
Creatinine, Ser: 1.12 mg/dL — ABNORMAL HIGH (ref 0.57–1.00)
Glucose: 453 mg/dL — ABNORMAL HIGH (ref 70–99)
Potassium: 3.8 mmol/L (ref 3.5–5.2)
Sodium: 134 mmol/L (ref 134–144)
eGFR: 57 mL/min/1.73 — ABNORMAL LOW (ref >59)

## 2024-04-03 ENCOUNTER — Other Ambulatory Visit: Payer: Self-pay

## 2024-04-03 ENCOUNTER — Ambulatory Visit: Payer: Self-pay | Admitting: Family Medicine

## 2024-04-24 ENCOUNTER — Encounter: Payer: Self-pay | Admitting: Oncology

## 2024-04-24 ENCOUNTER — Inpatient Hospital Stay: Payer: Self-pay | Admitting: Oncology

## 2024-04-24 ENCOUNTER — Inpatient Hospital Stay: Payer: Self-pay | Attending: Family

## 2024-04-29 ENCOUNTER — Other Ambulatory Visit: Payer: Self-pay

## 2024-05-08 ENCOUNTER — Other Ambulatory Visit: Payer: Self-pay

## 2024-05-18 ENCOUNTER — Other Ambulatory Visit: Payer: Self-pay

## 2024-06-12 ENCOUNTER — Other Ambulatory Visit: Payer: Self-pay | Admitting: Family

## 2024-06-12 DIAGNOSIS — E1165 Type 2 diabetes mellitus with hyperglycemia: Secondary | ICD-10-CM

## 2024-06-12 NOTE — Telephone Encounter (Signed)
 Copied from CRM #[id number]. Topic: Clinical - Medication Refill >> Jun 12, 2024  2:56 PM Rosaria E wrote: Medication: Insulin  Glargine (BASAGLAR  KWIKPEN) 100 UNIT/ML  Has the patient contacted their pharmacy? No (Agent: If no, request that the patient contact the pharmacy for the refill. If patient does not wish to contact the pharmacy document the reason why and proceed with request.) (Agent: If yes, when and what did the pharmacy advise?)  This is the patient's preferred pharmacy:  [facility] - [facility] [address], [address] [place] [address] Phone: [phone] Fax: [phone]  Is this the correct pharmacy for this prescription? Yes If no, delete pharmacy and type the correct one.   Has the prescription been filled recently? Yes  Is the patient out of the medication? No.  Has the patient been seen for an appointment in the last year OR does the patient have an upcoming appointment? Yes  Can we respond through MyChart? Yes  Agent: Please be advised that Rx refills may take up to 3 business days. We ask that you follow-up with your pharmacy.

## 2024-06-16 ENCOUNTER — Other Ambulatory Visit: Payer: Self-pay

## 2024-06-16 MED ORDER — BASAGLAR KWIKPEN 100 UNIT/ML ~~LOC~~ SOPN
34.0000 [IU] | PEN_INJECTOR | Freq: Every day | SUBCUTANEOUS | 1 refills | Status: DC
  Filled 2024-06-16: qty 15, 44d supply, fill #0

## 2024-06-16 NOTE — Telephone Encounter (Signed)
 Requested Prescriptions  Pending Prescriptions Disp Refills   Insulin  Glargine (BASAGLAR  KWIKPEN) 100 UNIT/ML 15 mL 1    Sig: Inject 34 Units into the skin daily.     Endocrinology:  Diabetes - Insulins Failed - 06/16/2024  8:31 AM      Failed - HBA1C is between 0 and 7.9 and within 180 days    HbA1c, POC (prediabetic range)  Date Value Ref Range Status  08/29/2022 6.2 5.7 - 6.4 % Final   HbA1c, POC (controlled diabetic range)  Date Value Ref Range Status  09/30/2023 7.1 (A) 0.0 - 7.0 % Final   Hgb A1c MFr Bld  Date Value Ref Range Status  04/01/2024 13.1 (H) 4.8 - 5.6 % Final    Comment:             Prediabetes: 5.7 - 6.4          Diabetes: >6.4          Glycemic control for adults with diabetes: <7.0          Passed - Valid encounter within last 6 months    Recent Outpatient Visits           2 months ago Primary hypertension   [facility], Amy J, NP   4 months ago Hyperglycemia   [facility], Amy J, NP   5 months ago Primary hypertension   [facility], Amy J, NP   8 months ago Primary hypertension   [facility], Amy J, NP   11 months ago Primary hypertension   [facility], Greig PARAS, NP

## 2024-06-30 ENCOUNTER — Other Ambulatory Visit: Payer: Self-pay

## 2024-07-07 ENCOUNTER — Ambulatory Visit: Payer: Self-pay | Admitting: Family

## 2024-07-07 ENCOUNTER — Other Ambulatory Visit: Payer: Self-pay

## 2024-07-07 VITALS — BP 135/87 | HR 65 | Temp 98.2°F | Resp 16 | Ht 63.0 in | Wt 165.8 lb

## 2024-07-07 DIAGNOSIS — Z0189 Encounter for other specified special examinations: Secondary | ICD-10-CM

## 2024-07-07 DIAGNOSIS — E119 Type 2 diabetes mellitus without complications: Secondary | ICD-10-CM

## 2024-07-07 DIAGNOSIS — E1165 Type 2 diabetes mellitus with hyperglycemia: Secondary | ICD-10-CM

## 2024-07-07 DIAGNOSIS — Z794 Long term (current) use of insulin: Secondary | ICD-10-CM

## 2024-07-07 DIAGNOSIS — I10 Essential (primary) hypertension: Secondary | ICD-10-CM

## 2024-07-07 MED ORDER — BASAGLAR KWIKPEN 100 UNIT/ML ~~LOC~~ SOPN
34.0000 [IU] | PEN_INJECTOR | Freq: Every day | SUBCUTANEOUS | 1 refills | Status: DC
  Filled 2024-07-07: qty 15, 44d supply, fill #0

## 2024-07-07 MED ORDER — SPIRONOLACTONE 100 MG PO TABS
100.0000 mg | ORAL_TABLET | Freq: Every day | ORAL | 0 refills | Status: AC
  Filled 2024-07-07: qty 90, 90d supply, fill #0

## 2024-07-07 MED ORDER — FUROSEMIDE 40 MG PO TABS
40.0000 mg | ORAL_TABLET | Freq: Every day | ORAL | 0 refills | Status: AC
  Filled 2024-07-07: qty 90, 90d supply, fill #0

## 2024-07-07 MED ORDER — HYDRALAZINE HCL 25 MG PO TABS
25.0000 mg | ORAL_TABLET | Freq: Two times a day (BID) | ORAL | 0 refills | Status: AC
  Filled 2024-07-07: qty 180, 90d supply, fill #0

## 2024-07-07 MED ORDER — CARVEDILOL 6.25 MG PO TABS
6.2500 mg | ORAL_TABLET | Freq: Two times a day (BID) | ORAL | 0 refills | Status: AC
  Filled 2024-07-07: qty 180, 90d supply, fill #0

## 2024-07-07 NOTE — Progress Notes (Signed)
 "   Patient ID: Mary Anderson, female    DOB: Nov 24, 1965  MRN: [id number]  CC: Chronic Conditions Follow-Up  Subjective: Mary Anderson is a 59 y.o. female who presents for chronic conditions follow-up.   Her concerns today include:  - Doing well on Carvedilol , Hydralazine , Spironolactone , and Furosemide , no issues/concerns. She does not complain of red flag symptoms such as but not limited to chest pain, shortness of breath, worst headache of life, nausea/vomiting.  - Doing well on Insulin  Glargine, no issues/concerns. Patient confirms she is intolerant to oral diabetes medications. Denies red flag symptoms associated with diabetes. States since previous office visit she did not establish with Endocrinology. - Due for diabetic eye exam.  - Due for diabetic foot exam.  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 10/26/2021   Cirrhosis (HCC) 09/11/2017   Splenomegaly 09/11/2017   Thrombocytopenia 09/11/2017   Calculus of gallbladder without cholecystitis without obstruction 09/10/2017   Chronic hepatitis C (HCC) 08/30/2017   Hypertension 07/31/2017     Medications Ordered Prior to Encounter[1]  Allergies[2]  Social History   Socioeconomic History   Marital status: Married    Spouse name: Not on file   Number of children: Not on file   Years of education: Not on file   Highest education level: 11th grade  Occupational History   Not on file  Tobacco Use   Smoking status: Never    Passive exposure: Never   Smokeless tobacco: Never  Vaping Use   Vaping status: Never Used  Substance and Sexual Activity   Alcohol use: Not Currently     Comment: no alcohol the past 6 months.   Drug use: No   Sexual activity: Yes    Birth control/protection: None  Other Topics Concern   Not on file  Social History Narrative   Not on file   Social Drivers of Health   Tobacco Use: Low Risk (04/01/2024)   Patient History    Smoking Tobacco Use: Never    Smokeless Tobacco Use: Never    Passive Exposure: Never  Financial Resource Strain: Low Risk (07/06/2024)   Overall Financial Resource Strain (CARDIA)    Difficulty of Paying Living Expenses: Not very hard  Food Insecurity: No Food Insecurity (07/06/2024)   Epic    Worried About Radiation Protection Practitioner of Food in the Last Year: Never true    Ran Out of Food in the Last Year: Never true  Transportation Needs: No Transportation Needs (07/06/2024)   Epic    Lack of Transportation (Medical): No    Lack of Transportation (Non-Medical): No  Physical Activity: Insufficiently Active (12/29/2023)   Exercise Vital Sign    Days of Exercise per Week: 1 day    Minutes of Exercise per Session: 10 min  Stress: No Stress Concern Present (12/29/2023)   [employer] of Occupational Health - Occupational Stress Questionnaire    Feeling of Stress: Not at all  Social Connections: Moderately Integrated (07/06/2024)   Social Connection and Isolation Panel    Frequency of Communication with Friends and Family: Three times a week    Frequency of Social Gatherings with Friends and Family: Twice a week  Attends Religious Services: 1 to 4 times per year    Active Member of Clubs or Organizations: No    Attends Banker Meetings: Not on file    Marital Status: Married  Intimate Partner Violence: Not At Risk (04/19/2022)   Humiliation, Afraid, Rape, and Kick questionnaire    Fear of Current or Ex-Partner: No    Emotionally Abused: No    Physically Abused: No    Sexually Abused: No  Depression (PHQ2-9): Low Risk (07/07/2024)   Depression (PHQ2-9)    PHQ-2 Score: 0  Alcohol Screen: Low Risk (12/30/2023)    Alcohol Screen    Last Alcohol Screening Score (AUDIT): 0  Housing: Unknown (07/06/2024)   Epic    Unable to Pay for Housing in the Last Year: No    Number of Times Moved in the Last Year: Not on file    Homeless in the Last Year: No  Utilities: Not At Risk (04/19/2022)   [employer] Utilities    Threatened with loss of utilities: No  Health Literacy: Adequate Health Literacy (12/30/2023)   B1300 Health Literacy    Frequency of need for help with medical instructions: Never    Family History  Problem Relation Age of Onset   Hypertension Mother    Colon cancer Mother    Hypertension Brother    Cancer Maternal Uncle    Cancer Maternal Uncle    Clotting disorder Neg Hx    Diabetes Neg Hx     Past Surgical History:  Procedure Laterality Date   ESOPHAGOGASTRODUODENOSCOPY (EGD) WITH PROPOFOL  N/A 01/30/2022   Procedure: ESOPHAGOGASTRODUODENOSCOPY (EGD) WITH PROPOFOL ;  Surgeon: Jinny Carmine, MD;  Location: [facility] END[facility]COPY;  Service: Endoscopy;  Laterality: N/A;   IR THORACENTESIS ASP PLEURAL SPACE W/IMG GUIDE  10/27/2021    ROS: Review of Systems Negative except as stated above  PHYSICAL EXAM: BP 135/87   Pulse 65   Temp 98.2 F (36.8 C) (Oral)   Resp 16   Ht 5' 3 (1.6 m)   Wt 165 lb 12.8 oz (75.2 kg)   LMP 04/22/2011   SpO2 95%   BMI 29.37 kg/m   Physical Exam HENT:     Head: Normocephalic and atraumatic.     Nose: Nose normal.     Mouth/Throat:     Mouth: Mucous membranes are moist.     Pharynx: Oropharynx is clear.  Eyes:     Extraocular Movements: Extraocular movements intact.     Conjunctiva/sclera: Conjunctivae normal.     Pupils: Pupils are equal, round, and reactive to light.  Cardiovascular:     Rate and Rhythm: Normal rate and regular rhythm.     Pulses: Normal pulses.     Heart sounds: Normal heart sounds.  Pulmonary:     Effort: Pulmonary effort is normal.     Breath sounds: Normal breath sounds.  Musculoskeletal:        General: Normal range of motion.      Cervical back: Normal range of motion and neck supple.  Neurological:     General: No focal deficit present.     Mental Status: She is alert and oriented to person, place, and time.  Psychiatric:        Mood and Affect: Mood normal.        Behavior: Behavior normal.     ASSESSMENT AND PLAN: 1. Primary hypertension (Primary) - Continue Carvedilol , Hydralazine , Spironolactone , and Furosemide  as prescribed.  - Routine screening.  - Counseled on blood pressure goal of  less than 130/80, low-sodium, DASH diet, medication compliance, and 150 minutes of moderate intensity exercise per week as tolerated. Counseled on medication adherence and adverse effects. - Follow-up with primary provider in 3 months or sooner if needed.  - carvedilol  (COREG ) 6.25 MG tablet; Take 1 tablet (6.25 mg total) by mouth 2 (two) times daily with a meal.  Dispense: 180 tablet; Refill: 0 - hydrALAZINE  (APRESOLINE ) 25 MG tablet; Take 1 tablet (25 mg total) by mouth 2 (two) times daily.  Dispense: 180 tablet; Refill: 0 - spironolactone  (ALDACTONE ) 100 MG tablet; Take 1 tablet (100 mg total) by mouth daily.  Dispense: 90 tablet; Refill: 0 - furosemide  (LASIX ) 40 MG tablet; Take 1 tablet (40 mg total) by mouth daily.  Dispense: 90 tablet; Refill: 0 - Basic Metabolic Panel  2. Type 2 diabetes mellitus with hyperglycemia, with long-term current use of insulin  (HCC) - Hemoglobin A1c result pending.  - Continue Insulin  Glargine as prescribed.  - Patient intolerant to oral diabetic medications.  - Routine screening.  - Discussed the importance of healthy eating habits, low-carbohydrate diet, low-sugar diet, regular aerobic exercise (at least 150 minutes a week as tolerated) and medication compliance to achieve or maintain control of diabetes. Counseled on medication adherence/adverse effects. - Follow-up with primary provider as scheduled.  - Microalbumin / creatinine urine ratio - Urine Albumin /Creatinine with ratio (send out)  [LAB689] - Insulin  Glargine (BASAGLAR  KWIKPEN) 100 UNIT/ML; Inject 34 Units into the skin daily.  Dispense: 15 mL; Refill: 1 - Hemoglobin A1c  3. Diabetic eye exam [facility]) - Referral to Ophthalmology for evaluation/management. - Ambulatory referral to Ophthalmology  4. Encounter for diabetic foot exam [facility]) - Referral to Podiatry for evaluation/management. - Ambulatory referral to Podiatry  Patient was given the opportunity to ask questions.  Patient verbalized understanding of the plan and was able to repeat key elements of the plan. Patient was given clear instructions to go to Emergency Department or return to medical center if symptoms don't improve, worsen, or new problems develop.The patient verbalized understanding.   Orders Placed This Encounter  Procedures   Microalbumin / creatinine urine ratio   Urine Albumin /Creatinine with ratio (send out) [LAB689]   Basic Metabolic Panel   Hemoglobin A1c   Ambulatory referral to Podiatry   Ambulatory referral to Ophthalmology     Requested Prescriptions   Signed Prescriptions Disp Refills   carvedilol  (COREG ) 6.25 MG tablet 180 tablet 0    Sig: Take 1 tablet (6.25 mg total) by mouth 2 (two) times daily with a meal.   hydrALAZINE  (APRESOLINE ) 25 MG tablet 180 tablet 0    Sig: Take 1 tablet (25 mg total) by mouth 2 (two) times daily.   spironolactone  (ALDACTONE ) 100 MG tablet 90 tablet 0    Sig: Take 1 tablet (100 mg total) by mouth daily.   furosemide  (LASIX ) 40 MG tablet 90 tablet 0    Sig: Take 1 tablet (40 mg total) by mouth daily.   Insulin  Glargine (BASAGLAR  KWIKPEN) 100 UNIT/ML 15 mL 1    Sig: Inject 34 Units into the skin daily.    Return in about 3 months (around 10/05/2024) for Follow-Up or next available chronic conditions.  Greig JINNY Chute, NP      [1]  Current Outpatient Medications on File Prior to Visit  Medication Sig Dispense Refill   Accu-Chek Softclix Lancets lancets Use as directed up to four times daily 200  each 0   acetaminophen  (TYLENOL ) 500 MG tablet Take 500-1,000 mg by  mouth every 8 (eight) hours as needed for mild pain (pain score 1-3).     amoxicillin -clavulanate (AUGMENTIN ) 875-125 MG tablet Take 1 tablet by mouth every 12 (twelve) hours. 14 tablet 0   Blood Glucose Monitoring Suppl (ACCU-CHEK GUIDE) w/Device KIT Use as directed up to four times daily 1 kit 0   folic acid  (FOLVITE ) 1 MG tablet Take 1 tablet (1 mg total) by mouth daily. 90 tablet 0   gabapentin  (NEURONTIN ) 300 MG capsule Take 1 capsule (300 mg total) by mouth at bedtime. 90 capsule 0   glucose blood (ACCU-CHEK GUIDE) test strip Use as directed up to four times daily. 100 each 0   ibuprofen  (ADVIL ,MOTRIN ) 200 MG tablet Take 200-400 mg by mouth daily as needed (pain).     insulin  lispro (HUMALOG  KWIKPEN) 100 UNIT/ML KwikPen Inject 0-9 Units into the skin with breakfast, with lunch, and with evening meal. CBG 120 mg/dl or less 0 units of insulin  CBG 121-150 1 unit CBG 151-200 2 units CBG 201-250 3 units CBG 251-300 5 units CBG 301-350 7 units CBG 351 or higher 9 units 15 mL 11   Insulin  Pen Needle (TRUEPLUS 5-BEVEL PEN NEEDLES) 32G X 4 MM MISC Use once daily 100 each 0   Insulin  Pen Needle 32G X 4 MM MISC Use once daily 100 each 0   Insulin  Pen Needle 32G X 4 MM MISC Use to inject insulin  with breakfast, with lunch, and with evening meal. 100 each 0   melatonin 5 MG TABS Take 5 mg by mouth.     naproxen  (NAPROSYN ) 500 MG tablet Take 1 tablet (500 mg total) by mouth 2 (two) times daily. 14 tablet 0   oxyCODONE  (ROXICODONE ) 5 MG immediate release tablet Take 0.5-1 tablets (2.5-5 mg total) by mouth every 6 (six) hours as needed for severe pain (pain score 7-10). 8 tablet 0   No current facility-administered medications on file prior to visit.  [2] No Known Allergies  "

## 2024-07-08 ENCOUNTER — Other Ambulatory Visit: Payer: Self-pay

## 2024-07-08 ENCOUNTER — Ambulatory Visit: Payer: Self-pay

## 2024-07-08 ENCOUNTER — Other Ambulatory Visit: Payer: Self-pay | Admitting: Family

## 2024-07-08 ENCOUNTER — Ambulatory Visit: Payer: Self-pay | Admitting: Family

## 2024-07-08 DIAGNOSIS — E1165 Type 2 diabetes mellitus with hyperglycemia: Secondary | ICD-10-CM

## 2024-07-08 LAB — MICROALBUMIN / CREATININE URINE RATIO
Creatinine, Urine: 47.3 mg/dL
Microalb/Creat Ratio: 18 mg/g{creat} (ref 0–29)
Microalbumin, Urine: 8.4 ug/mL

## 2024-07-08 MED ORDER — BASAGLAR KWIKPEN 100 UNIT/ML ~~LOC~~ SOPN
39.0000 [IU] | PEN_INJECTOR | Freq: Every day | SUBCUTANEOUS | 2 refills | Status: AC
  Filled 2024-07-08: qty 15, 38d supply, fill #0

## 2024-07-08 NOTE — Telephone Encounter (Signed)
 Ann, [employer], disconnected call prior to warm transfer. Nurse will attempt to call back.  Specialists reports lab tech did not give any information, only stated patient has critical labs; unknown lab.  Specialists reports attempted to contact CAL at [facility] several times, no answer, then called Nurse Triage.

## 2024-07-08 NOTE — Telephone Encounter (Signed)
 Called CAL regarding unknown critical lab, no answer. Will HP CRM to CAL.

## 2024-07-08 NOTE — Telephone Encounter (Signed)
 Nurse called [phone], [facility], unable to reach anyone or leave message. Will HP CRM to CAL.

## 2024-07-08 NOTE — Telephone Encounter (Signed)
 Copied from CRM [id number]. Topic: Clinical - Lab/Test Results >> Jul 08, 2024 10:07 AM Rosaria BRAVO wrote: Reason for CRM: Critical lab results from [employer], Ann calling

## 2024-07-09 ENCOUNTER — Telehealth: Payer: Self-pay | Admitting: Emergency Medicine

## 2024-07-09 LAB — HEMOGLOBIN A1C
Est. average glucose Bld gHb Est-mCnc: 341 mg/dL
Hgb A1c MFr Bld: 13.5 % — ABNORMAL HIGH (ref 4.8–5.6)

## 2024-07-09 LAB — BASIC METABOLIC PANEL WITH GFR
BUN/Creatinine Ratio: 18 (ref 9–23)
BUN: 19 mg/dL (ref 6–24)
CO2: 25 mmol/L (ref 20–29)
Calcium: 9.2 mg/dL (ref 8.7–10.2)
Chloride: 92 mmol/L — ABNORMAL LOW (ref 96–106)
Creatinine, Ser: 1.05 mg/dL — ABNORMAL HIGH (ref 0.57–1.00)
Glucose: 553 mg/dL (ref 70–99)
Potassium: 4.4 mmol/L (ref 3.5–5.2)
Sodium: 130 mmol/L — ABNORMAL LOW (ref 134–144)
eGFR: 62 mL/min/1.73

## 2024-07-09 NOTE — Telephone Encounter (Signed)
 I called patient to let her know MD Tanda wants her to go the the Emergency Room because her glucose is 553

## 2024-07-09 NOTE — Telephone Encounter (Signed)
 Copied from CRM #[id number]. Topic: Clinical - Lab/Test Results >> Jul 09, 2024  1:37 PM Darshell M wrote: Reason for CRM: [name] with [facility] calling to report critical lab. Blood glucose of 553 drawn 07/07/24.  I called patient and made her aware of MD wilson recommendations to report to ED

## 2024-07-10 ENCOUNTER — Other Ambulatory Visit: Payer: Self-pay

## 2024-08-07 ENCOUNTER — Other Ambulatory Visit: Payer: Self-pay | Admitting: Family

## 2024-08-07 DIAGNOSIS — E1165 Type 2 diabetes mellitus with hyperglycemia: Secondary | ICD-10-CM

## 2024-08-07 NOTE — Telephone Encounter (Signed)
 Copied from CRM [id number]. Topic: Clinical - Medication Refill >> Aug 07, 2024 12:36 PM Victoria B wrote: Medication: Insulin  Glargine (BASAGLAR  KWIKPEN) 100 UNIT/ML  Has the patient contacted their pharmacy? no This is the patient's preferred pharmacy:  [facility] [address], [address] [place] [address] Phone: [phone] Fax: [phone]   Is this the correct pharmacy for this prescription? yes   Has the prescription been filled recently? no  Is the patient out of the medication? yes  Has the patient been seen for an appointment in the last year OR does the patient have an upcoming appointment? yes  Can we respond through MyChart? yes Agent: Please be advised that Rx refills may take up to 3 business days. We ask that you follow-up with your pharmacy.

## 2024-10-13 ENCOUNTER — Ambulatory Visit: Payer: Self-pay | Admitting: Family
